# Patient Record
Sex: Female | Born: 1945 | Race: White | Hispanic: No | Smoking: Former smoker
Health system: Southern US, Community
[De-identification: ages and names within clinical notes are randomized; demographics above are authoritative.]

## PROBLEM LIST (undated history)

## (undated) DIAGNOSIS — I4891 Unspecified atrial fibrillation: Secondary | ICD-10-CM

## (undated) DIAGNOSIS — Z8679 Personal history of other diseases of the circulatory system: Secondary | ICD-10-CM

## (undated) DIAGNOSIS — E785 Hyperlipidemia, unspecified: Secondary | ICD-10-CM

## (undated) DIAGNOSIS — I2510 Atherosclerotic heart disease of native coronary artery without angina pectoris: Secondary | ICD-10-CM

## (undated) DIAGNOSIS — I10 Essential (primary) hypertension: Secondary | ICD-10-CM

## (undated) DIAGNOSIS — Z950 Presence of cardiac pacemaker: Secondary | ICD-10-CM

## (undated) DIAGNOSIS — I350 Nonrheumatic aortic (valve) stenosis: Secondary | ICD-10-CM

## (undated) DIAGNOSIS — I442 Atrioventricular block, complete: Secondary | ICD-10-CM

## (undated) DIAGNOSIS — Z9889 Other specified postprocedural states: Secondary | ICD-10-CM

## (undated) HISTORY — DX: Hyperlipidemia, unspecified: E78.5

## (undated) HISTORY — DX: Essential (primary) hypertension: I10

## (undated) HISTORY — DX: Other specified postprocedural states: Z98.890

## (undated) HISTORY — DX: Personal history of other diseases of the circulatory system: Z86.79

## (undated) HISTORY — DX: Nonrheumatic aortic (valve) stenosis: I35.0

## (undated) HISTORY — DX: Atrioventricular block, complete: I44.2

---

## 2003-08-15 ENCOUNTER — Ambulatory Visit (HOSPITAL_COMMUNITY): Admission: RE | Admit: 2003-08-15 | Discharge: 2003-08-15 | Payer: Self-pay | Admitting: *Deleted

## 2003-08-15 ENCOUNTER — Encounter (INDEPENDENT_AMBULATORY_CARE_PROVIDER_SITE_OTHER): Payer: Self-pay | Admitting: Cardiology

## 2005-02-21 ENCOUNTER — Emergency Department (HOSPITAL_COMMUNITY): Admission: EM | Admit: 2005-02-21 | Discharge: 2005-02-21 | Payer: Self-pay | Admitting: Emergency Medicine

## 2006-09-12 ENCOUNTER — Emergency Department (HOSPITAL_COMMUNITY): Admission: EM | Admit: 2006-09-12 | Discharge: 2006-09-13 | Payer: Self-pay | Admitting: Emergency Medicine

## 2006-09-15 ENCOUNTER — Emergency Department (HOSPITAL_COMMUNITY): Admission: EM | Admit: 2006-09-15 | Discharge: 2006-09-15 | Payer: Self-pay | Admitting: Emergency Medicine

## 2009-05-18 ENCOUNTER — Encounter: Admission: RE | Admit: 2009-05-18 | Discharge: 2009-05-18 | Payer: Self-pay | Admitting: Family Medicine

## 2009-08-21 ENCOUNTER — Encounter: Admission: RE | Admit: 2009-08-21 | Discharge: 2009-08-21 | Payer: Self-pay | Admitting: Cardiology

## 2009-08-25 ENCOUNTER — Ambulatory Visit (HOSPITAL_COMMUNITY): Admission: RE | Admit: 2009-08-25 | Discharge: 2009-08-25 | Payer: Self-pay | Admitting: Cardiology

## 2009-08-25 HISTORY — PX: CARDIAC CATHETERIZATION: SHX172

## 2009-09-11 ENCOUNTER — Ambulatory Visit: Payer: Self-pay | Admitting: Thoracic Surgery (Cardiothoracic Vascular Surgery)

## 2009-09-13 ENCOUNTER — Ambulatory Visit
Admission: RE | Admit: 2009-09-13 | Discharge: 2009-09-13 | Payer: Self-pay | Admitting: Thoracic Surgery (Cardiothoracic Vascular Surgery)

## 2009-09-19 ENCOUNTER — Ambulatory Visit: Payer: Self-pay | Admitting: Dentistry

## 2009-09-19 ENCOUNTER — Encounter: Admission: AD | Admit: 2009-09-19 | Discharge: 2009-09-19 | Payer: Self-pay | Admitting: Dentistry

## 2009-10-02 ENCOUNTER — Ambulatory Visit: Payer: Self-pay | Admitting: Thoracic Surgery (Cardiothoracic Vascular Surgery)

## 2010-02-12 ENCOUNTER — Ambulatory Visit: Payer: Self-pay | Admitting: Thoracic Surgery (Cardiothoracic Vascular Surgery)

## 2010-03-06 ENCOUNTER — Ambulatory Visit: Payer: Self-pay | Admitting: Vascular Surgery

## 2010-03-06 ENCOUNTER — Encounter: Payer: Self-pay | Admitting: Thoracic Surgery (Cardiothoracic Vascular Surgery)

## 2010-03-07 ENCOUNTER — Inpatient Hospital Stay (HOSPITAL_COMMUNITY)
Admission: RE | Admit: 2010-03-07 | Discharge: 2010-03-15 | Payer: Self-pay | Admitting: Thoracic Surgery (Cardiothoracic Vascular Surgery)

## 2010-03-07 ENCOUNTER — Encounter: Payer: Self-pay | Admitting: Thoracic Surgery (Cardiothoracic Vascular Surgery)

## 2010-03-07 ENCOUNTER — Ambulatory Visit: Payer: Self-pay | Admitting: Thoracic Surgery (Cardiothoracic Vascular Surgery)

## 2010-03-07 DIAGNOSIS — Z8679 Personal history of other diseases of the circulatory system: Secondary | ICD-10-CM

## 2010-03-07 DIAGNOSIS — I350 Nonrheumatic aortic (valve) stenosis: Secondary | ICD-10-CM

## 2010-03-07 HISTORY — PX: AORTIC VALVE REPLACEMENT: SHX41

## 2010-03-07 HISTORY — DX: Nonrheumatic aortic (valve) stenosis: I35.0

## 2010-03-07 HISTORY — PX: ABDOMINAL AORTIC ANEURYSM REPAIR: SUR1152

## 2010-03-07 HISTORY — DX: Personal history of other diseases of the circulatory system: Z86.79

## 2010-03-13 HISTORY — PX: PACEMAKER INSERTION: SHX728

## 2010-03-26 ENCOUNTER — Encounter
Admission: RE | Admit: 2010-03-26 | Discharge: 2010-03-26 | Payer: Self-pay | Admitting: Thoracic Surgery (Cardiothoracic Vascular Surgery)

## 2010-03-26 ENCOUNTER — Ambulatory Visit: Payer: Self-pay | Admitting: Thoracic Surgery (Cardiothoracic Vascular Surgery)

## 2010-04-17 ENCOUNTER — Emergency Department (HOSPITAL_COMMUNITY): Admission: EM | Admit: 2010-04-17 | Discharge: 2010-04-17 | Payer: Self-pay | Admitting: Emergency Medicine

## 2010-04-23 ENCOUNTER — Encounter: Admission: RE | Admit: 2010-04-23 | Discharge: 2010-04-23 | Payer: Self-pay | Admitting: Cardiothoracic Surgery

## 2010-04-23 ENCOUNTER — Ambulatory Visit: Payer: Self-pay | Admitting: Thoracic Surgery (Cardiothoracic Vascular Surgery)

## 2010-05-07 ENCOUNTER — Encounter
Admission: RE | Admit: 2010-05-07 | Discharge: 2010-05-07 | Payer: Self-pay | Admitting: Thoracic Surgery (Cardiothoracic Vascular Surgery)

## 2010-05-07 ENCOUNTER — Ambulatory Visit: Payer: Self-pay | Admitting: Thoracic Surgery (Cardiothoracic Vascular Surgery)

## 2010-05-10 ENCOUNTER — Encounter (HOSPITAL_COMMUNITY): Admission: RE | Admit: 2010-05-10 | Discharge: 2010-06-01 | Payer: Self-pay | Admitting: Cardiology

## 2010-07-02 ENCOUNTER — Ambulatory Visit: Payer: Self-pay | Admitting: Thoracic Surgery (Cardiothoracic Vascular Surgery)

## 2011-02-18 LAB — CBC
HCT: 38.3 % (ref 36.0–46.0)
Hemoglobin: 12.9 g/dL (ref 12.0–15.0)
MCHC: 33.7 g/dL (ref 30.0–36.0)
MCV: 89.9 fL (ref 78.0–100.0)
Platelets: 282 10*3/uL (ref 150–400)
RBC: 4.27 MIL/uL (ref 3.87–5.11)
RDW: 14.5 % (ref 11.5–15.5)
WBC: 7 10*3/uL (ref 4.0–10.5)

## 2011-02-18 LAB — DIFFERENTIAL
Basophils Absolute: 0 10*3/uL (ref 0.0–0.1)
Basophils Relative: 1 % (ref 0–1)
Eosinophils Absolute: 0.2 10*3/uL (ref 0.0–0.7)
Eosinophils Relative: 3 % (ref 0–5)
Lymphocytes Relative: 19 % (ref 12–46)
Lymphs Abs: 1.3 10*3/uL (ref 0.7–4.0)
Monocytes Absolute: 0.5 10*3/uL (ref 0.1–1.0)
Monocytes Relative: 7 % (ref 3–12)
Neutro Abs: 4.9 10*3/uL (ref 1.7–7.7)
Neutrophils Relative %: 71 % (ref 43–77)

## 2011-02-18 LAB — COMPREHENSIVE METABOLIC PANEL
ALT: 14 U/L (ref 0–35)
AST: 23 U/L (ref 0–37)
Albumin: 3.5 g/dL (ref 3.5–5.2)
Alkaline Phosphatase: 52 U/L (ref 39–117)
BUN: 8 mg/dL (ref 6–23)
CO2: 25 mEq/L (ref 19–32)
Calcium: 9.1 mg/dL (ref 8.4–10.5)
Chloride: 107 mEq/L (ref 96–112)
Creatinine, Ser: 0.72 mg/dL (ref 0.4–1.2)
GFR calc Af Amer: >60 mL/min (ref >60)
GFR calc non Af Amer: >60 mL/min (ref >60)
Glucose, Bld: 119 mg/dL — ABNORMAL HIGH (ref 70–99)
Potassium: 3.6 mEq/L (ref 3.5–5.1)
Sodium: 141 mEq/L (ref 135–145)
Total Bilirubin: 0.7 mg/dL (ref 0.3–1.2)
Total Protein: 7.6 g/dL (ref 6.0–8.3)

## 2011-02-18 LAB — PROTIME-INR
INR: 3.04 — ABNORMAL HIGH (ref 0.00–1.49)
Prothrombin Time: 31.2 seconds — ABNORMAL HIGH (ref 11.6–15.2)

## 2011-02-20 LAB — TYPE AND SCREEN
ABO/RH(D): B POS
Antibody Screen: NEGATIVE

## 2011-02-20 LAB — PREPARE PLATELETS

## 2011-02-20 LAB — POCT I-STAT 3, ART BLOOD GAS (G3+)
Acid-Base Excess: 1 mmol/L (ref 0.0–2.0)
Acid-Base Excess: 1 mmol/L (ref 0.0–2.0)
Acid-Base Excess: 1 mmol/L (ref 0.0–2.0)
Acid-base deficit: 1 mmol/L (ref 0.0–2.0)
Bicarbonate: 25.2 mEq/L — ABNORMAL HIGH (ref 20.0–24.0)
Bicarbonate: 25.8 mEq/L — ABNORMAL HIGH (ref 20.0–24.0)
Bicarbonate: 26 mEq/L — ABNORMAL HIGH (ref 20.0–24.0)
Bicarbonate: 27.2 mEq/L — ABNORMAL HIGH (ref 20.0–24.0)
Bicarbonate: 27.2 mEq/L — ABNORMAL HIGH (ref 20.0–24.0)
O2 Saturation: 100 %
O2 Saturation: 100 %
O2 Saturation: 100 %
O2 Saturation: 88 %
O2 Saturation: 99 %
Patient temperature: 33.9
Patient temperature: 37.1
TCO2: 27 mmol/L (ref 0–100)
TCO2: 27 mmol/L (ref 0–100)
TCO2: 27 mmol/L (ref 0–100)
TCO2: 29 mmol/L (ref 0–100)
TCO2: 29 mmol/L (ref 0–100)
pCO2 arterial: 39.7 mmHg (ref 35.0–45.0)
pCO2 arterial: 40.7 mmHg (ref 35.0–45.0)
pCO2 arterial: 47.6 mmHg — ABNORMAL HIGH (ref 35.0–45.0)
pCO2 arterial: 50.2 mmHg — ABNORMAL HIGH (ref 35.0–45.0)
pCO2 arterial: 54 mmHg — ABNORMAL HIGH (ref 35.0–45.0)
pH, Arterial: 7.311 — ABNORMAL LOW (ref 7.350–7.400)
pH, Arterial: 7.332 — ABNORMAL LOW (ref 7.350–7.400)
pH, Arterial: 7.342 — ABNORMAL LOW (ref 7.350–7.400)
pH, Arterial: 7.409 — ABNORMAL HIGH (ref 7.350–7.400)
pH, Arterial: 7.41 — ABNORMAL HIGH (ref 7.350–7.400)
pO2, Arterial: 142 mmHg — ABNORMAL HIGH (ref 80.0–100.0)
pO2, Arterial: 193 mmHg — ABNORMAL HIGH (ref 80.0–100.0)
pO2, Arterial: 349 mmHg — ABNORMAL HIGH (ref 80.0–100.0)
pO2, Arterial: 383 mmHg — ABNORMAL HIGH (ref 80.0–100.0)
pO2, Arterial: 61 mmHg — ABNORMAL LOW (ref 80.0–100.0)

## 2011-02-20 LAB — BLOOD GAS, ARTERIAL
Acid-Base Excess: 3 mmol/L — ABNORMAL HIGH (ref 0.0–2.0)
Bicarbonate: 27.1 mEq/L — ABNORMAL HIGH (ref 20.0–24.0)
Drawn by: 206361
FIO2: 0.21 %
O2 Saturation: 99.4 %
Patient temperature: 98.6
TCO2: 28.4 mmol/L (ref 0–100)
pCO2 arterial: 42 mmHg (ref 35.0–45.0)
pH, Arterial: 7.425 — ABNORMAL HIGH (ref 7.350–7.400)
pO2, Arterial: 78.5 mmHg — ABNORMAL LOW (ref 80.0–100.0)

## 2011-02-20 LAB — BASIC METABOLIC PANEL
BUN: 11 mg/dL (ref 6–23)
BUN: 13 mg/dL (ref 6–23)
BUN: 19 mg/dL (ref 6–23)
BUN: 6 mg/dL (ref 6–23)
CO2: 24 mEq/L (ref 19–32)
CO2: 26 mEq/L (ref 19–32)
CO2: 27 mEq/L (ref 19–32)
CO2: 32 mEq/L (ref 19–32)
Calcium: 8.1 mg/dL — ABNORMAL LOW (ref 8.4–10.5)
Calcium: 8.3 mg/dL — ABNORMAL LOW (ref 8.4–10.5)
Calcium: 8.6 mg/dL (ref 8.4–10.5)
Calcium: 8.8 mg/dL (ref 8.4–10.5)
Chloride: 101 mEq/L (ref 96–112)
Chloride: 102 mEq/L (ref 96–112)
Chloride: 104 mEq/L (ref 96–112)
Chloride: 113 mEq/L — ABNORMAL HIGH (ref 96–112)
Creatinine, Ser: 0.6 mg/dL (ref 0.4–1.2)
Creatinine, Ser: 0.62 mg/dL (ref 0.4–1.2)
Creatinine, Ser: 0.71 mg/dL (ref 0.4–1.2)
Creatinine, Ser: 0.85 mg/dL (ref 0.4–1.2)
GFR calc Af Amer: >60 mL/min (ref >60)
GFR calc Af Amer: >60 mL/min (ref >60)
GFR calc Af Amer: >60 mL/min (ref >60)
GFR calc Af Amer: >60 mL/min (ref >60)
GFR calc non Af Amer: >60 mL/min (ref >60)
GFR calc non Af Amer: >60 mL/min (ref >60)
GFR calc non Af Amer: >60 mL/min (ref >60)
GFR calc non Af Amer: >60 mL/min (ref >60)
Glucose, Bld: 104 mg/dL — ABNORMAL HIGH (ref 70–99)
Glucose, Bld: 112 mg/dL — ABNORMAL HIGH (ref 70–99)
Glucose, Bld: 135 mg/dL — ABNORMAL HIGH (ref 70–99)
Glucose, Bld: 99 mg/dL (ref 70–99)
Potassium: 4 mEq/L (ref 3.5–5.1)
Potassium: 4 mEq/L (ref 3.5–5.1)
Potassium: 4.2 mEq/L (ref 3.5–5.1)
Potassium: 4.3 mEq/L (ref 3.5–5.1)
Sodium: 135 mEq/L (ref 135–145)
Sodium: 135 mEq/L (ref 135–145)
Sodium: 137 mEq/L (ref 135–145)
Sodium: 142 mEq/L (ref 135–145)

## 2011-02-20 LAB — POCT I-STAT, CHEM 8
BUN: 14 mg/dL (ref 6–23)
Calcium, Ion: 1.15 mmol/L (ref 1.12–1.32)
Chloride: 109 mEq/L (ref 96–112)
Creatinine, Ser: 0.6 mg/dL (ref 0.4–1.2)
Glucose, Bld: 107 mg/dL — ABNORMAL HIGH (ref 70–99)
HCT: 29 % — ABNORMAL LOW (ref 36.0–46.0)
Hemoglobin: 9.9 g/dL — ABNORMAL LOW (ref 12.0–15.0)
Potassium: 4.3 mEq/L (ref 3.5–5.1)
Sodium: 143 mEq/L (ref 135–145)
TCO2: 25 mmol/L (ref 0–100)

## 2011-02-20 LAB — POCT I-STAT 4, (NA,K, GLUC, HGB,HCT)
Glucose, Bld: 102 mg/dL — ABNORMAL HIGH (ref 70–99)
Glucose, Bld: 104 mg/dL — ABNORMAL HIGH (ref 70–99)
Glucose, Bld: 105 mg/dL — ABNORMAL HIGH (ref 70–99)
Glucose, Bld: 106 mg/dL — ABNORMAL HIGH (ref 70–99)
Glucose, Bld: 111 mg/dL — ABNORMAL HIGH (ref 70–99)
Glucose, Bld: 112 mg/dL — ABNORMAL HIGH (ref 70–99)
Glucose, Bld: 129 mg/dL — ABNORMAL HIGH (ref 70–99)
HCT: 25 % — ABNORMAL LOW (ref 36.0–46.0)
HCT: 26 % — ABNORMAL LOW (ref 36.0–46.0)
HCT: 27 % — ABNORMAL LOW (ref 36.0–46.0)
HCT: 28 % — ABNORMAL LOW (ref 36.0–46.0)
HCT: 28 % — ABNORMAL LOW (ref 36.0–46.0)
HCT: 28 % — ABNORMAL LOW (ref 36.0–46.0)
HCT: 28 % — ABNORMAL LOW (ref 36.0–46.0)
Hemoglobin: 8.5 g/dL — ABNORMAL LOW (ref 12.0–15.0)
Hemoglobin: 8.8 g/dL — ABNORMAL LOW (ref 12.0–15.0)
Hemoglobin: 9.2 g/dL — ABNORMAL LOW (ref 12.0–15.0)
Hemoglobin: 9.5 g/dL — ABNORMAL LOW (ref 12.0–15.0)
Hemoglobin: 9.5 g/dL — ABNORMAL LOW (ref 12.0–15.0)
Hemoglobin: 9.5 g/dL — ABNORMAL LOW (ref 12.0–15.0)
Hemoglobin: 9.5 g/dL — ABNORMAL LOW (ref 12.0–15.0)
Potassium: 3.4 mEq/L — ABNORMAL LOW (ref 3.5–5.1)
Potassium: 3.4 mEq/L — ABNORMAL LOW (ref 3.5–5.1)
Potassium: 3.6 mEq/L (ref 3.5–5.1)
Potassium: 3.8 mEq/L (ref 3.5–5.1)
Potassium: 4 mEq/L (ref 3.5–5.1)
Potassium: 4.5 mEq/L (ref 3.5–5.1)
Potassium: 5 mEq/L (ref 3.5–5.1)
Sodium: 137 mEq/L (ref 135–145)
Sodium: 138 mEq/L (ref 135–145)
Sodium: 139 mEq/L (ref 135–145)
Sodium: 141 mEq/L (ref 135–145)
Sodium: 142 mEq/L (ref 135–145)
Sodium: 142 mEq/L (ref 135–145)
Sodium: 143 mEq/L (ref 135–145)

## 2011-02-20 LAB — APTT
aPTT: 31 seconds (ref 24–37)
aPTT: 36 seconds (ref 24–37)
aPTT: 41 seconds — ABNORMAL HIGH (ref 24–37)

## 2011-02-20 LAB — PROTIME-INR
INR: 1.18 (ref 0.00–1.49)
INR: 1.21 (ref 0.00–1.49)
INR: 1.25 (ref 0.00–1.49)
INR: 1.28 (ref 0.00–1.49)
INR: 1.37 (ref 0.00–1.49)
INR: 1.43 (ref 0.00–1.49)
INR: 1.57 — ABNORMAL HIGH (ref 0.00–1.49)
INR: 1.6 — ABNORMAL HIGH (ref 0.00–1.49)
INR: 2 — ABNORMAL HIGH (ref 0.00–1.49)
Prothrombin Time: 14.9 seconds (ref 11.6–15.2)
Prothrombin Time: 15.2 seconds (ref 11.6–15.2)
Prothrombin Time: 15.6 seconds — ABNORMAL HIGH (ref 11.6–15.2)
Prothrombin Time: 15.9 seconds — ABNORMAL HIGH (ref 11.6–15.2)
Prothrombin Time: 16.8 seconds — ABNORMAL HIGH (ref 11.6–15.2)
Prothrombin Time: 17.3 seconds — ABNORMAL HIGH (ref 11.6–15.2)
Prothrombin Time: 18.6 seconds — ABNORMAL HIGH (ref 11.6–15.2)
Prothrombin Time: 18.9 seconds — ABNORMAL HIGH (ref 11.6–15.2)
Prothrombin Time: 22.5 seconds — ABNORMAL HIGH (ref 11.6–15.2)

## 2011-02-20 LAB — HEMOGLOBIN AND HEMATOCRIT, BLOOD
HCT: 29.3 % — ABNORMAL LOW (ref 36.0–46.0)
Hemoglobin: 10.1 g/dL — ABNORMAL LOW (ref 12.0–15.0)

## 2011-02-20 LAB — GLUCOSE, CAPILLARY
Glucose-Capillary: 104 mg/dL — ABNORMAL HIGH (ref 70–99)
Glucose-Capillary: 106 mg/dL — ABNORMAL HIGH (ref 70–99)
Glucose-Capillary: 108 mg/dL — ABNORMAL HIGH (ref 70–99)
Glucose-Capillary: 109 mg/dL — ABNORMAL HIGH (ref 70–99)
Glucose-Capillary: 113 mg/dL — ABNORMAL HIGH (ref 70–99)
Glucose-Capillary: 129 mg/dL — ABNORMAL HIGH (ref 70–99)
Glucose-Capillary: 167 mg/dL — ABNORMAL HIGH (ref 70–99)
Glucose-Capillary: 168 mg/dL — ABNORMAL HIGH (ref 70–99)
Glucose-Capillary: 80 mg/dL (ref 70–99)
Glucose-Capillary: 91 mg/dL (ref 70–99)
Glucose-Capillary: 92 mg/dL (ref 70–99)

## 2011-02-20 LAB — CBC
HCT: 27.2 % — ABNORMAL LOW (ref 36.0–46.0)
HCT: 29 % — ABNORMAL LOW (ref 36.0–46.0)
HCT: 29.6 % — ABNORMAL LOW (ref 36.0–46.0)
HCT: 30.8 % — ABNORMAL LOW (ref 36.0–46.0)
HCT: 31 % — ABNORMAL LOW (ref 36.0–46.0)
HCT: 32.3 % — ABNORMAL LOW (ref 36.0–46.0)
HCT: 34.8 % — ABNORMAL LOW (ref 36.0–46.0)
Hemoglobin: 10.5 g/dL — ABNORMAL LOW (ref 12.0–15.0)
Hemoglobin: 10.6 g/dL — ABNORMAL LOW (ref 12.0–15.0)
Hemoglobin: 11 g/dL — ABNORMAL LOW (ref 12.0–15.0)
Hemoglobin: 11.6 g/dL — ABNORMAL LOW (ref 12.0–15.0)
Hemoglobin: 9.2 g/dL — ABNORMAL LOW (ref 12.0–15.0)
Hemoglobin: 9.7 g/dL — ABNORMAL LOW (ref 12.0–15.0)
Hemoglobin: 9.9 g/dL — ABNORMAL LOW (ref 12.0–15.0)
MCHC: 33.4 g/dL (ref 30.0–36.0)
MCHC: 33.6 g/dL (ref 30.0–36.0)
MCHC: 33.6 g/dL (ref 30.0–36.0)
MCHC: 33.8 g/dL (ref 30.0–36.0)
MCHC: 34 g/dL (ref 30.0–36.0)
MCHC: 34.1 g/dL (ref 30.0–36.0)
MCHC: 34.2 g/dL (ref 30.0–36.0)
MCV: 92 fL (ref 78.0–100.0)
MCV: 93 fL (ref 78.0–100.0)
MCV: 93 fL (ref 78.0–100.0)
MCV: 93.5 fL (ref 78.0–100.0)
MCV: 93.9 fL (ref 78.0–100.0)
MCV: 94.1 fL (ref 78.0–100.0)
MCV: 94.5 fL (ref 78.0–100.0)
Platelets: 110 10*3/uL — ABNORMAL LOW (ref 150–400)
Platelets: 114 10*3/uL — ABNORMAL LOW (ref 150–400)
Platelets: 117 10*3/uL — ABNORMAL LOW (ref 150–400)
Platelets: 119 10*3/uL — ABNORMAL LOW (ref 150–400)
Platelets: 122 10*3/uL — ABNORMAL LOW (ref 150–400)
Platelets: 141 10*3/uL — ABNORMAL LOW (ref 150–400)
Platelets: 216 10*3/uL (ref 150–400)
RBC: 2.91 MIL/uL — ABNORMAL LOW (ref 3.87–5.11)
RBC: 3.09 MIL/uL — ABNORMAL LOW (ref 3.87–5.11)
RBC: 3.19 MIL/uL — ABNORMAL LOW (ref 3.87–5.11)
RBC: 3.29 MIL/uL — ABNORMAL LOW (ref 3.87–5.11)
RBC: 3.31 MIL/uL — ABNORMAL LOW (ref 3.87–5.11)
RBC: 3.42 MIL/uL — ABNORMAL LOW (ref 3.87–5.11)
RBC: 3.78 MIL/uL — ABNORMAL LOW (ref 3.87–5.11)
RDW: 13.9 % (ref 11.5–15.5)
RDW: 14.4 % (ref 11.5–15.5)
RDW: 14.4 % (ref 11.5–15.5)
RDW: 14.5 % (ref 11.5–15.5)
RDW: 14.7 % (ref 11.5–15.5)
RDW: 14.8 % (ref 11.5–15.5)
RDW: 14.9 % (ref 11.5–15.5)
WBC: 10.5 10*3/uL (ref 4.0–10.5)
WBC: 4.3 10*3/uL (ref 4.0–10.5)
WBC: 6.1 10*3/uL (ref 4.0–10.5)
WBC: 6.6 10*3/uL (ref 4.0–10.5)
WBC: 6.7 10*3/uL (ref 4.0–10.5)
WBC: 7.4 10*3/uL (ref 4.0–10.5)
WBC: 7.8 10*3/uL (ref 4.0–10.5)

## 2011-02-20 LAB — POCT I-STAT GLUCOSE
Glucose, Bld: 116 mg/dL — ABNORMAL HIGH (ref 70–99)
Operator id: 3402

## 2011-02-20 LAB — PREPARE FRESH FROZEN PLASMA

## 2011-02-20 LAB — COMPREHENSIVE METABOLIC PANEL
ALT: 19 U/L (ref 0–35)
AST: 23 U/L (ref 0–37)
Albumin: 3.9 g/dL (ref 3.5–5.2)
Alkaline Phosphatase: 40 U/L (ref 39–117)
BUN: 16 mg/dL (ref 6–23)
CO2: 25 mEq/L (ref 19–32)
Calcium: 8.8 mg/dL (ref 8.4–10.5)
Chloride: 109 mEq/L (ref 96–112)
Creatinine, Ser: 0.55 mg/dL (ref 0.4–1.2)
GFR calc Af Amer: >60 mL/min (ref >60)
GFR calc non Af Amer: >60 mL/min (ref >60)
Glucose, Bld: 102 mg/dL — ABNORMAL HIGH (ref 70–99)
Potassium: 3.9 mEq/L (ref 3.5–5.1)
Sodium: 139 mEq/L (ref 135–145)
Total Bilirubin: 0.7 mg/dL (ref 0.3–1.2)
Total Protein: 6.8 g/dL (ref 6.0–8.3)

## 2011-02-20 LAB — URINALYSIS, ROUTINE W REFLEX MICROSCOPIC
Bilirubin Urine: NEGATIVE
Glucose, UA: NEGATIVE mg/dL
Hgb urine dipstick: NEGATIVE
Ketones, ur: NEGATIVE mg/dL
Nitrite: NEGATIVE
Protein, ur: NEGATIVE mg/dL
Specific Gravity, Urine: 1.018 (ref 1.005–1.030)
Urobilinogen, UA: 0.2 mg/dL (ref 0.0–1.0)
pH: 7 (ref 5.0–8.0)

## 2011-02-20 LAB — MRSA PCR SCREENING: MRSA by PCR: NEGATIVE

## 2011-02-20 LAB — CREATININE, SERUM
Creatinine, Ser: 0.68 mg/dL (ref 0.4–1.2)
GFR calc Af Amer: >60 mL/min (ref >60)
GFR calc non Af Amer: >60 mL/min (ref >60)

## 2011-02-20 LAB — ABO/RH: ABO/RH(D): B POS

## 2011-02-20 LAB — HEMOGLOBIN A1C
Hgb A1c MFr Bld: 5.8 % (ref 4.6–6.1)
Mean Plasma Glucose: 120 mg/dL

## 2011-02-20 LAB — PLATELET COUNT: Platelets: 87 10*3/uL — ABNORMAL LOW (ref 150–400)

## 2011-02-20 LAB — MAGNESIUM
Magnesium: 2.4 mg/dL (ref 1.5–2.5)
Magnesium: 2.8 mg/dL — ABNORMAL HIGH (ref 1.5–2.5)

## 2011-02-20 LAB — SURGICAL PCR SCREEN
MRSA, PCR: NEGATIVE
Staphylococcus aureus: POSITIVE — AB

## 2011-03-08 LAB — POCT I-STAT 3, ART BLOOD GAS (G3+)
Acid-Base Excess: 1 mmol/L (ref 0.0–2.0)
Bicarbonate: 26 mEq/L — ABNORMAL HIGH (ref 20.0–24.0)
Bicarbonate: 26 mEq/L — ABNORMAL HIGH (ref 20.0–24.0)
O2 Saturation: 54 %
O2 Saturation: 89 %
TCO2: 27 mmol/L (ref 0–100)
TCO2: 27 mmol/L (ref 0–100)
pCO2 arterial: 41.3 mmHg (ref 35.0–45.0)
pCO2 arterial: 47 mmHg — ABNORMAL HIGH (ref 35.0–45.0)
pH, Arterial: 7.351 (ref 7.350–7.400)
pH, Arterial: 7.407 — ABNORMAL HIGH (ref 7.350–7.400)
pO2, Arterial: 30 mmHg — CL (ref 80.0–100.0)
pO2, Arterial: 56 mmHg — ABNORMAL LOW (ref 80.0–100.0)

## 2011-03-08 LAB — PROTIME-INR
INR: 1.1 (ref 0.00–1.49)
Prothrombin Time: 14.2 seconds (ref 11.6–15.2)

## 2011-03-26 IMAGING — CR DG CHEST 2V
2 series · 2 of 2 positions shown · non-contrast
Comparison: 03/08/2010

CLINICAL DATA: Coronary bypass, cough

CHEST - 2 VIEW

[w chest pa]
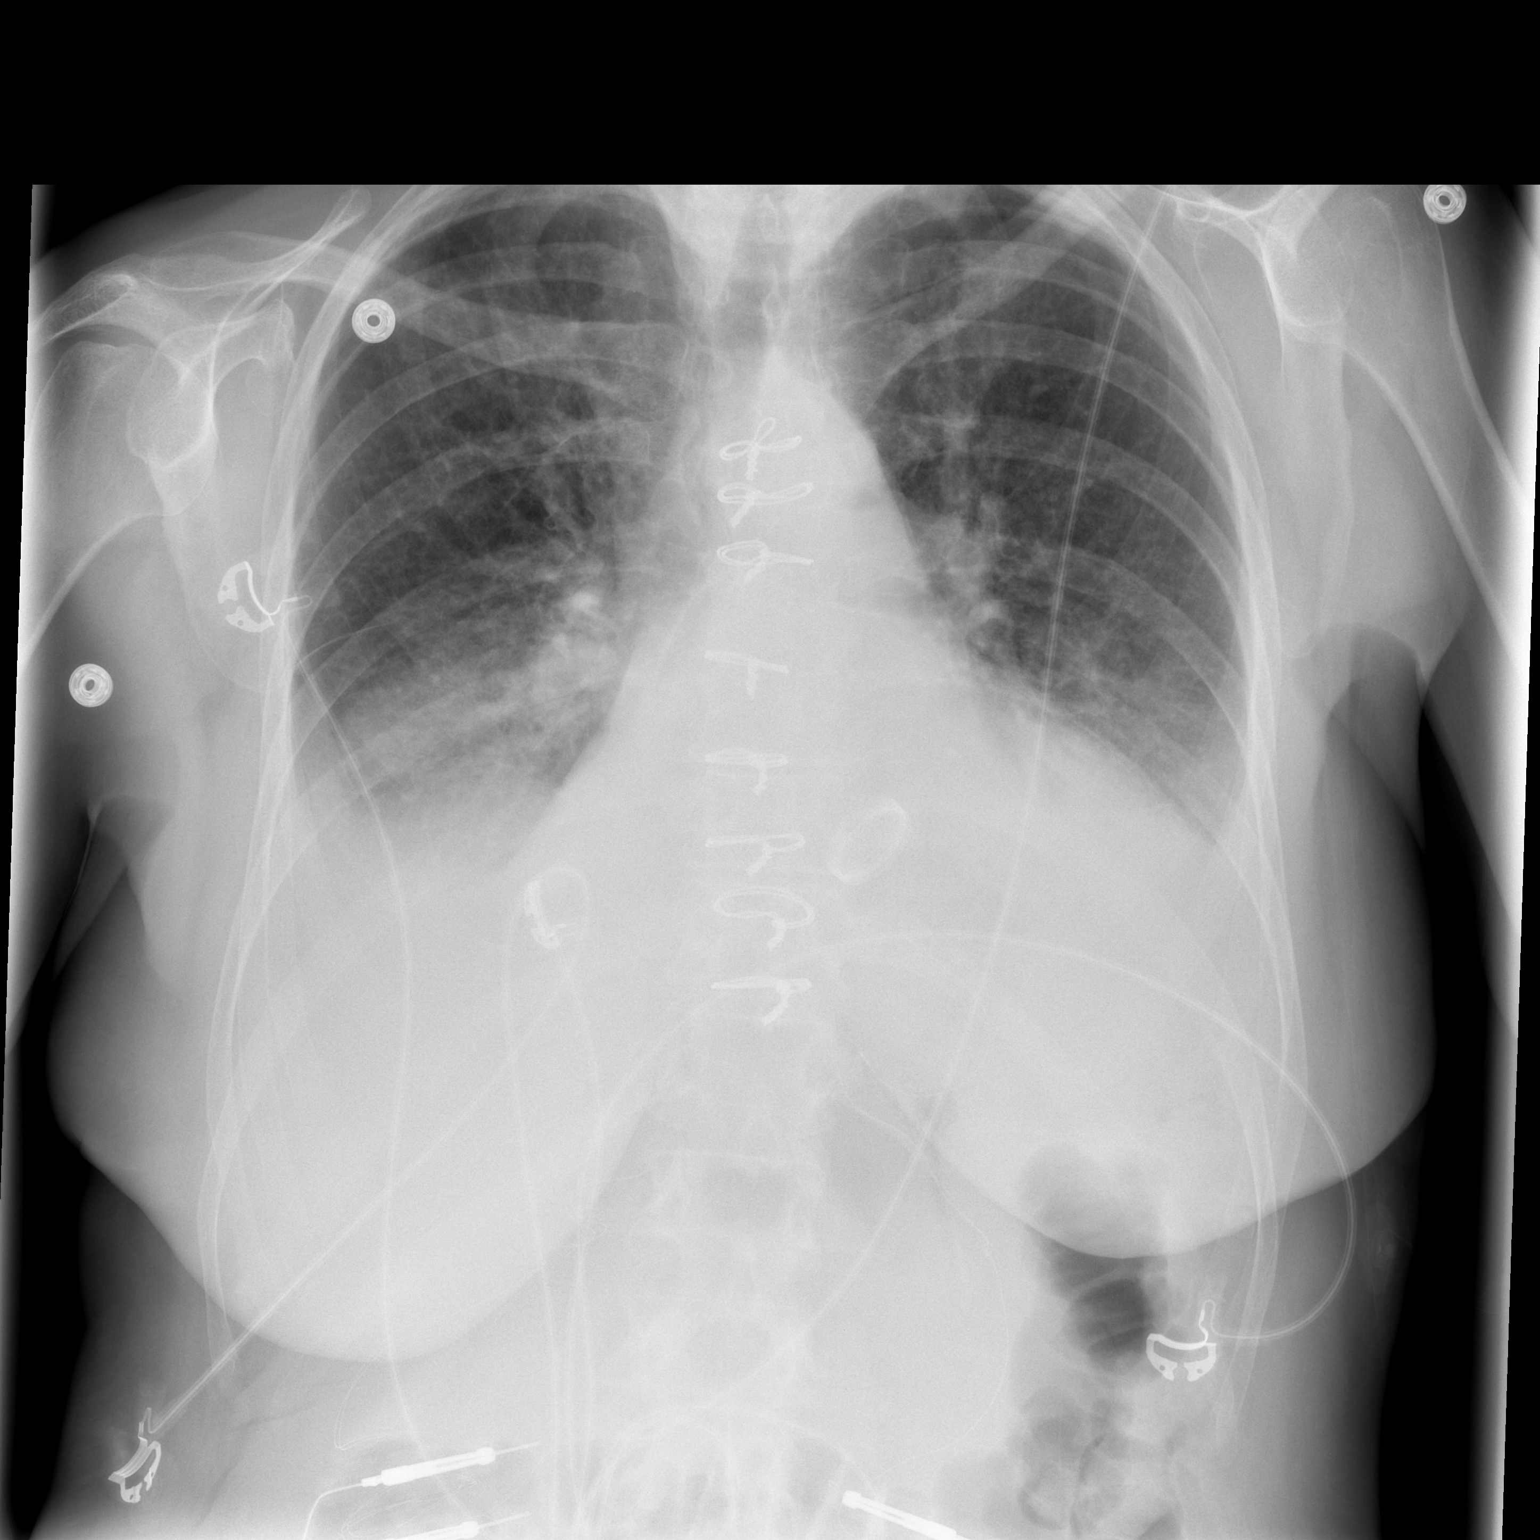

[w chest lat]
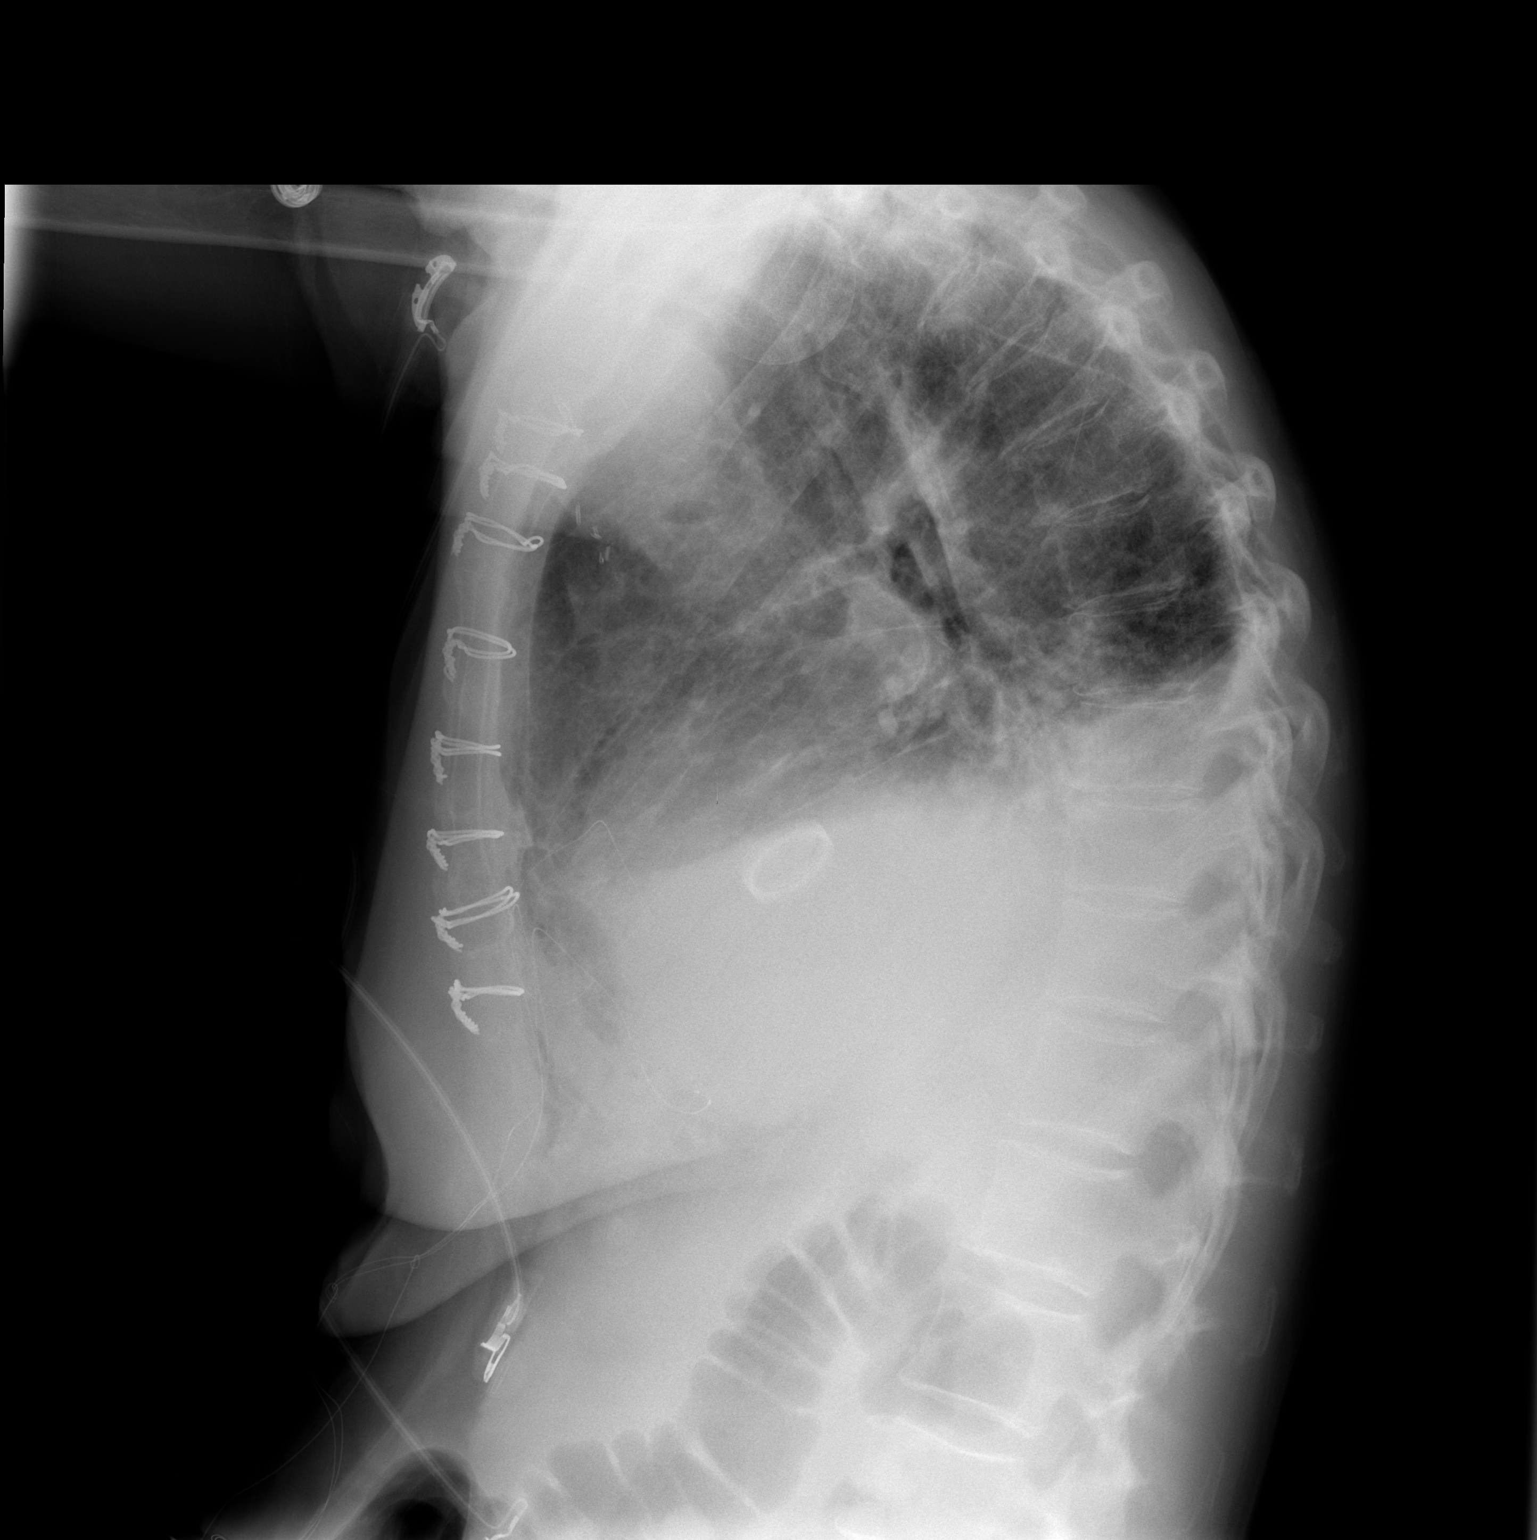

[2 of 2 positions shown; findings below may reference images not displayed]

FINDINGS: Interval removal of the mediastinal drain, chest tube and
Swan-Ganz catheter.  Aortic valve replacement noted.  Heart is
enlarged with basilar edema and atelectasis with associated
moderate effusions.  No pneumothorax.
IMPRESSION: Moderate effusions with basilar atelectasis / edema.

## 2011-03-31 IMAGING — CR DG CHEST 2V
2 series · 2 of 2 positions shown · non-contrast
Comparison: 03/09/2010 and earlier.

CLINICAL DATA: 64-year-old female status post pacemaker placement.

CHEST - 2 VIEW

[w chest pa]
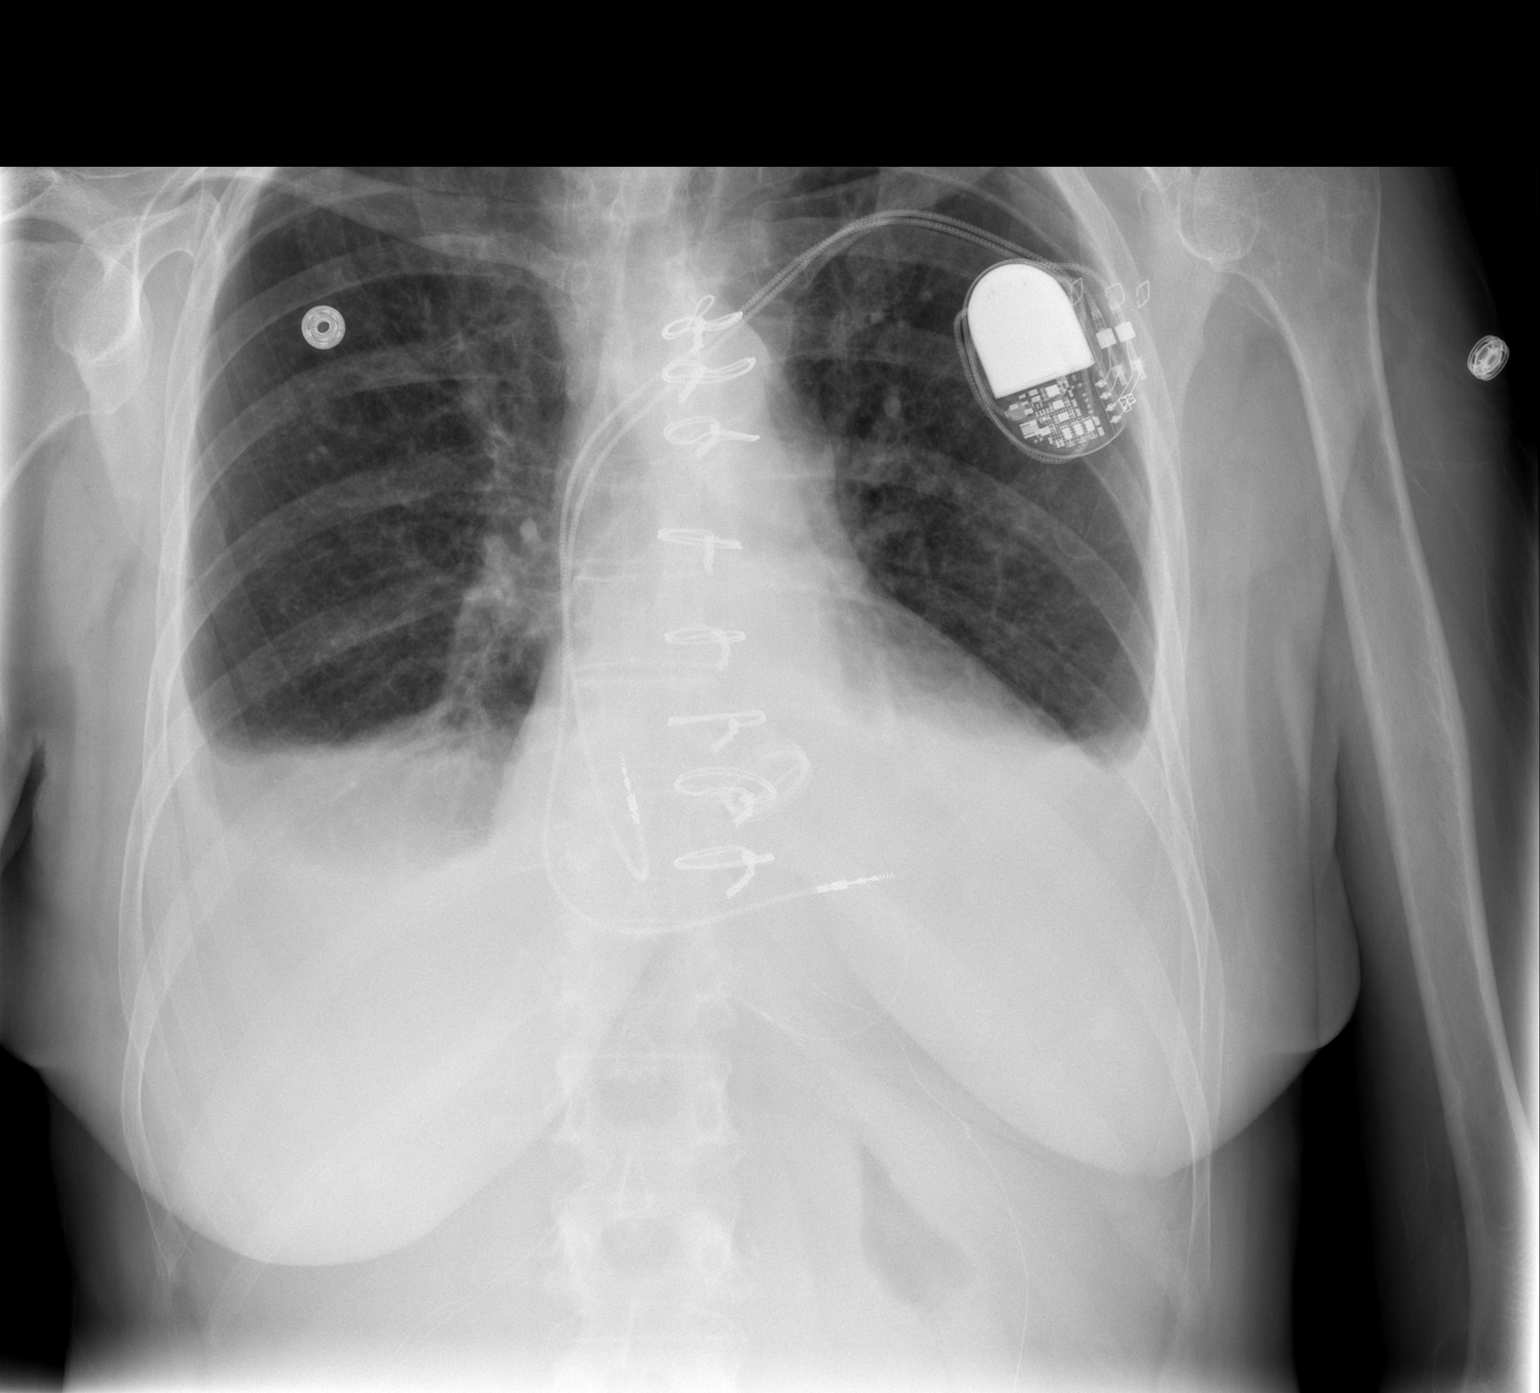

[w chest lat]
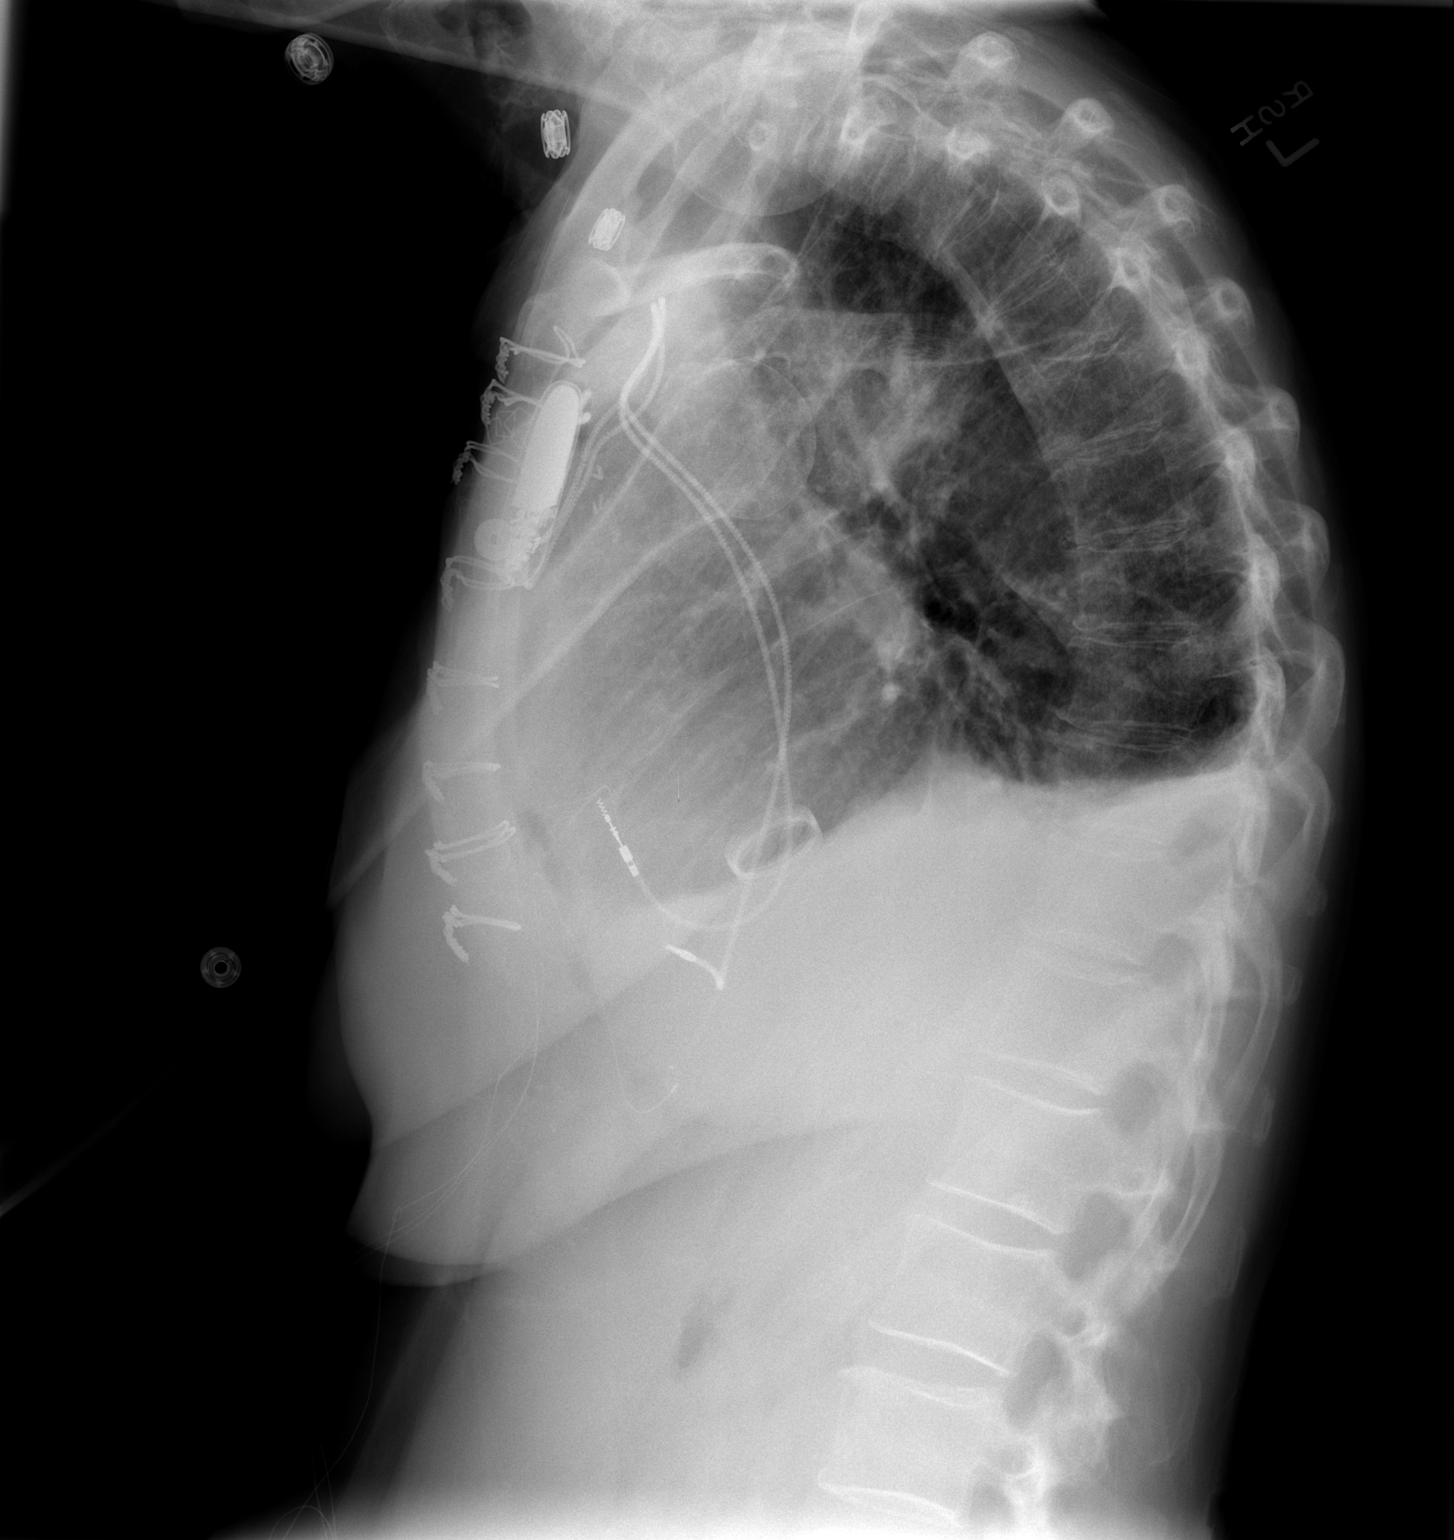

[2 of 2 positions shown; findings below may reference images not displayed]

FINDINGS: New left chest dual lead cardiac pacemaker.  No
pneumothorax.  Preexisting sequelae of median sternotomy and
cardiac valve replacement. Stable cardiomegaly and mediastinal
contours.  Moderate bilateral pleural effusions with bibasilar
collapse.  Interval decreased pulmonary vascular congestion.  No
overt pulmonary edema. Stable visualized osseous structures.
IMPRESSION: 1.  Left chest cardiac pacemaker.  No adverse features.
2.  Moderate pleural effusions.  Decreased / resolved pulmonary
edema.

## 2011-04-16 NOTE — Assessment & Plan Note (Signed)
OFFICE VISIT   [name] [name], [name] [name]  DOB:  June 16, 1946                                        October 02, 2009  CHART #:  [id number]   The patient returns for further follow up of severe aortic stenosis.  She was originally seen in consultation on September 11, 2009, and a full  history and physical exam was dictated at that time.  Since then, she  has been seen in consultation by Dr. Cindra Eves at the dental  office.  Dental extraction has been recommended.  She has been referred  to Dr. Hewitt Blade who is an oral surgeon for surgical intervention.  She has also been seen by Dr. Warren Danes but plans for the extraction  have not yet been finalized.  Finally, she also underwent pulmonary  function testing and CT angiogram of the thoracic aorta.  Pulmonary  function tests revealed findings consistent with mild-to-moderate  chronic obstructive pulmonary disease.  Specifically, the FEV-1 measured  1.57 L or 58% predicted.  This increased slightly to 1.63 L or 61%  predicted with bronchodilator therapy.  The baseline forced vital  capacity was 2.32 L or 66% predicted.  CT angiogram of the thoracic  aorta was performed on September 13, 2009.  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  To my exam the maximum transverse diameter of the aorta  was between 4.8 and 4.9 cm.  This corresponds to a descending thoracic  aorta that measures 2.3 cm.  In addition, there was a small high-density  opacity in the lingula with benign characteristics.  The radiologist  recommended followup CT scan in 6-12 months to confirm lack of change.  During the interval period of time, the patient has not been able to  quit smoking.   I again discussed matters at length with the patient and her daughter  here in the office today.  We discussed how important it will be for her  to find a way to quit smoking, and I have also  stressed the fact that  even if she can only quit smoking for a few weeks prior to surgery this  will decrease her risk of perioperative complications.  We await plans  for dental extraction, and once this has been completed we can schedule  her for surgery.  Based upon her recent CT angiogram, I do feel that  replacement of the ascending thoracic aorta will be indicated in this  setting of this patient with bicuspid aortic valve disease.  The patient  will call us back once her dental extraction has  been scheduled.  All of her questions have been addressed.  She is going  to make a concerted effort to quit smoking.   Salvatore Decent. Cornelius Moras, M.D.  Electronically Signed   CHO/MEDQ  D:  10/02/2009  T:  10/03/2009  Job:  [id number]   cc:   Sheliah Mends, MD

## 2011-04-16 NOTE — Assessment & Plan Note (Signed)
OFFICE VISIT   [name], [name]  DOB:  03-11-1946                                        March 26, 2010  CHART #:  [id number]   HISTORY OF PRESENT ILLNESS:  The patient returns to the office today for  routine followup status post Bentall aortic root replacement on March 07, 2010.  Her early postoperative recovery was notable for the development  of complete heart block for which she underwent placement of a permanent  pacemaker.  She otherwise did remarkably well.  Since hospital  discharge, she has continued to improve quite nicely.  She has had a  prothrombin time checked and Coumadin dose adjusted through the  [facility].  She states that her most recent  INR was 3.1 and she is taking 5 mg of Coumadin daily.  She has also been  seen in follow up by Dr. Royann Shivers who checked her pacemaker.  She has  not had any other problems at all, and she plans to start the cardiac  rehab program within the next week or two.  Overall, she reports mild  residual soreness particularly around her pacemaker in the left anterior  chest wall.  She is sleeping reasonably well at night and she is no  longer requiring any sort of oral narcotic pain relievers.  She has not  had any significant shortness of breath.  She has not had any tachy  palpitations or dizzy spells.  Her appetite is fair and has not  completely recovered.  She continues to abstain from any tobacco use.  The remainder of her review of systems is unremarkable.   PHYSICAL EXAMINATION:  Notable for well-appearing female with blood  pressure 134/78, pulse 75 and regular, oxygen saturation 95% on room  air.  Examination of the chest is notable for median sternotomy incision  that is healing nicely.  The sternum is stable on palpation.  The  pacemaker incision is also healing well, and there is no sign of any  significant hematoma or fluid collection around the pacemaker pocket.  Auscultation reveals clear breath sounds which are symmetrical  bilaterally.  No wheezes, rales, or rhonchi are noted.  Cardiovascular  exam demonstrates regular rate and rhythm with mechanical valve heart  sounds.  No murmurs, rubs, or gallops are noted.  The abdomen is soft,  nontender.  The extremities are warm and well perfused.  There is no  lower extremity edema.  The remainder of her physical exam is  unrevealing.   DIAGNOSTIC TESTS:  Chest x-ray performed today at the [facility] is reviewed.  This demonstrates clear lung fields bilaterally  with small residual pleural effusions, right greater than left.  This  has decreased in size from the time of her hospital discharge.  All of  the sternal wires appear intact.  No other significant abnormalities are  noted and the pacemaker leads appear to be in stable position.   IMPRESSION:  Excellent progress following recent Bentall aortic root  replacement.   PLAN:  I have encouraged the patient to continue to gradually increase  her physical activity as tolerated with her only limitations at this  point remaining that she refrain from heavy lifting or strenuous use of  her arms or shoulders for at least another 3 months or so.  I have  encouraged her to go ahead and get involved in the cardiac rehab  program.  I think that within the next week or two she could probably  start driving an automobile if she continues to improve.  We have not  made any changes in her current medications.  I have reminded her that  she must continue to abstain from any smoking indefinitely for the rest  of her life.  All of her questions have been addressed.  We will plan to  see her back in 3 months for routine followup.   Salvatore Decent. Cornelius Moras, M.D.  Electronically Signed   CHO/MEDQ  D:  03/26/2010  T:  03/27/2010  Job:  [id number]   cc:   Sheliah Mends, MD  Thurmon Fair, MD

## 2011-04-16 NOTE — Assessment & Plan Note (Signed)
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  She required  placement of a dual-chamber pacemaker postoperatively due to complete  heart block.  Since hospital discharge, she has continued to do very  well.  She was last seen here in the office on May 07, 2010.  Since  then, she has had no problems at all.  She reports that she is now back  to essentially normal physical activity, and she notes considerable  improvement in her exercise tolerance in comparison with how she felt  prior to surgery.  She no longer has any shortness of breath.  She has  minimal residual soreness in her chest.  She has not had any tachy  palpitations.  Her activity level is quite good and she has no  complaints.  She is doing reasonably well with Coumadin therapy,  although she states that her dose continues to be adjusted up and down a  little bit intermittently through the [facility].  The remainder of her review of systems is unremarkable.   PHYSICAL EXAMINATION:  Notable for well-appearing female with blood  pressure 135/83, pulse 94, oxygen saturation 96% on room air.  Examination of the chest reveals a median sternotomy scar that has  healed nicely.  The sternum is stable on palpation.  Breath sounds are  clear to auscultation and symmetrical bilaterally.  No wheezes, rales,  or rhonchi are noted.  Cardiovascular exam includes regular rate and  rhythm with obvious mechanical heart sounds.  No murmurs, rubs, or  gallops are appreciated.  The abdomen is soft, nontender.  The  extremities are warm and well perfused.   There is no lower extremity  edema.   IMPRESSION:  The patient is doing very well status post Bentall aortic  root replacement in April.   PLAN:  In the future, the patient will call and return to see [place] as  needed.  All of her questions have been addressed.  At this point, she  can return to essentially normal physical activity with only restriction  related to the fact that she is long-term anticoagulated on Coumadin.   Salvatore Decent. Cornelius Moras, M.D.  Electronically Signed   CHO/MEDQ  D:  07/02/2010  T:  07/03/2010  Job:  [id number]   cc:   Sheliah Mends, MD

## 2011-04-16 NOTE — Assessment & Plan Note (Signed)
OFFICE VISIT   [name], [name]  DOB:  03/19/1946                                        Apr 23, 2010  CHART #:  [id number]   HISTORY OF PRESENT ILLNESS:  The patient returns for followup status  post Bentall aortic root replacement on March 07, 2010, for bicuspid  aortic valve with severe aortic stenosis and aneurysmal enlargement of  the ascending thoracic aorta.  Her postoperative course was complicated  by complete heart block that ultimately required placement of a  permanent pacemaker.  She also developed postoperative atrial  fibrillation for which she was treated with amiodarone.  She was  ultimately discharged home.  She was last seen here in the office on  March 26, 2010, at which time she was progressing nicely.  She continued  to do well until last week when she had an episode where she felt her  heart racing very fast.  She presented to the emergency room where  apparently she was seen by Dr. Royann Shivers and her pacemaker was  interrogated.  She was noted to have had an episode of transient  paroxysmal atrial fibrillation.  A chest x-ray performed at that time  also demonstrated the presence of a moderate-sized right pleural  effusion.  She was started on oral prednisone and instructed to follow  up with [place] here in the office today.  She has not had any shortness of  breath.  She has been having headaches.  She notes that her blood  pressure was high at the time she was evaluated in the emergency room,  but she was not told to do anything different with her blood pressure  medications.  She otherwise feels well.  She has not had any further  episodes of tachy palpitations.  She has no shortness of breath.  She  has no cough.  Her appetite is stable.  She otherwise feels pretty well  other than her headaches.   PHYSICAL EXAMINATION:  Notable for well-appearing female with blood  pressure 210/91 on the right, 185/104 on the left; pulse is 86 and  regular; and oxygen saturation 95% on room air.  Examination of the  chest is notable for median sternotomy incision that is healing nicely.  The sternum is stable on palpation.  Breath sounds are clear to  auscultation and symmetrical bilaterally.  No wheezes, rales, or rhonchi  are noted.  Cardiovascular exam is notable for regular rate and rhythm.  Mechanical valve heart sounds are noted.  The abdomen is soft and  nontender.  The extremities are warm and well perfused.  There is no  lower extremity edema.   DIAGNOSTICS TESTS:  Chest x-ray performed today at the [facility] is reviewed.  This demonstrates mild persistent elevation  of the right hemidiaphragm with mild-to-moderate right pleural effusion,  which has decreased substantially in size in comparison with the x-ray  performed last week.   IMPRESSION:  Resolving right pleural effusion.  The patient has  significant hypertension.  It does not sound as though she has had any  further episodes of paroxysmal atrial fibrillation.  She otherwise looks  well.  I suspect her headaches may be related to her elevated blood  pressure.   PLAN:  I have instructed the patient to increase her dose of metoprolol  to 100 mg p.o.  twice daily.  I have also given her a prescription for  lisinopril 10 mg daily.  She is not sure when her next followup  appointment with Dr. Royann Shivers and colleagues at [facility] has been arranged.  I have instructed to make sure that  she get seen there within the next week or two.  She should have her  prothrombin time checked again at that time and to make sure and check  her blood pressure.  We will plan to see her back in 2 weeks.   Salvatore Decent. Cornelius Moras, M.D.  Electronically Signed   CHO/MEDQ  D:  04/23/2010  T:  04/24/2010  Job:  [id number]   cc:   Sheliah Mends, MD  Thurmon Fair, MD

## 2011-04-16 NOTE — H&P (Signed)
HISTORY AND PHYSICAL EXAMINATION   September 11, 2009   Re:  [name] [name], [name] [name]            DOB:  [birth year]   REQUESTING PHYSICIAN:  Sheliah Mends, MD   REASON FOR CONSULTATION:  Severe aortic stenosis.   HISTORY OF PRESENT ILLNESS:  The patient is a 65 year old widowed white  female from [place] with known history of bicuspid aortic valve and  severe aortic stenosis.  The patient was first noted to have a heart  murmur during childhood, and she was told that she had a history of  rheumatic fever.  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  She was noted to have a heart  murmur on exam.  She was seen and evaluated at [facility] and  [facility] by Dr. Lenise Herald.  Transesophageal echocardiogram  apparently revealed bicuspid aortic valve with severe aortic stenosis,  mild aortic regurgitation, and mild mitral regurgitation.  She was  treated medically, but had been lost to follow up from a cardiovascular  standpoint.  More recently, she underwent a routine colonoscopy by Dr.  Anselmo Rod.  Abnormal physical exam and electrocardiogram prompted  referral back to [facility] where she was  seen by Dr. Garen Lah on July 17, 2009.  A 2-D echocardiogram was  performed, which by report confirmed the presence of severe aortic  stenosis.  Specifically, the peak velocity across the aortic valve  measured 525 cm/sec corresponding to peak and mean transvalvular  gradients 110 and 74 mmHg respectively.  Aortic valve area was estimated  to be between 0.63 and 0.71 cm2.  Left ventricular systolic function  remained normal with ejection fraction estimated greater than or equal  to 55%.  There was moderate concentric left ventricular hypertrophy.  There was evidence for impaired left ventricular diastolic function  based on Doppler flow analysis.  There was mild-to-moderate aortic  regurgitation with mild mitral regurgitation, and moderate pulmonary  hypertension.  The patient subsequently underwent elective left and  right heart catheterization by Dr. Garen Lah on August 25, 2009.  The  peak-to-peak gradient across the aortic valve was 92 mmHg with a mean  gradient of 56 mmHg.  Pulmonary artery pressures measured 25/6 with  pulmonary capillary wedge pressure of 9, central venous pressure of 6.  Forward cardiac output remained normal.  There were luminal  irregularities in the coronary arteries, but no significant flow-  limiting coronary artery disease.  Aortic valve area was calculated 0.4  sq.cm.  The patient has been referred for possible elective aortic valve  replacement.   REVIEW OF SYSTEMS:  GENERAL:  The patient reports normal appetite.  She  has actually lost a little weight recently with some dietary changes.  Her energy level is stable.  VITAL SIGNS:  She is 5 feet 8 inches tall and reports weighing  approximately 132 pounds.  CARDIAC:  The patient describes a several year history of mild symptoms  of exertional shortness of breath, functional class II to class III.  She states that she usually only gets short of breath if she is walking  up a flight of stairs.  Her shortness of breath is always promptly  relieved by rest.  She denies any history of resting shortness of  breath, PND, orthopnea, or lower extremity edema.  She denies any  history of chest tightness or chest pressure with activity or at rest.  She reports occasional tachy palpitations.  She denies any dizzy spells  or syncopal episodes.  RESPIRATORY:  Notable for intermittent cough.  The patient has been  treated for mild bronchitis in the past, but none recently.  She denies  any recent productive cough, hemoptysis, or wheezing.  GASTROINTESTINAL:  Notable for some problems with nausea in the  evenings, typically occurs after she takes her cholesterol medicine.  This occasionally will  cause vomiting.  She has no difficulty  swallowing.  She denies hematochezia, hematemesis, or melena.  She did  recently have a colonoscopy and was told that it looked normal.  GENITOURINARY:  Negative.  The patient denies urinary urgency or  frequency.  PERIPHERAL VASCULAR:  Negative.  The patient did have a history of  central retinal artery thrombosis in 2004, for which she remains on  Coumadin.  She has no other history of any blood clotting or disorder  and she has not had any problems with bleeding, diathesis, or other  issues since being treated with Coumadin.  NEUROLOGIC:  Negative.  The patient denies symptoms suggestive of  previous stroke.  MUSCULOSKELETAL:  Negative.  The patient reports only mild arthralgias.  She does have chronic dry rash attributed to eczema.  PSYCHIATRIC:  Notable only for mild nervousness and anxiety.  HEENT:  Negative.  The patient has not seen a dentist in long time.  She  denies any ongoing dental problems, blue teeth or painful teeth.   PAST MEDICAL HISTORY:  1. Bicuspid aortic valve with severe aortic stenosis and mild to      moderate aortic regurgitation.  2. Hyperlipidemia.  3. Chronic obstructive pulmonary disease.  4. Right retinal artery thrombosis.   FAMILY HISTORY:  Noncontributory.   SOCIAL HISTORY:  The patient is widowed and lives alone locally here in  [place].  She has 3 grown children, one of whom accompanies her to  the office today.  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  She has a long history of heavy tobacco  abuse, smoking between 1-1/2 and 2 packs of cigarettes per day for many  years.  Recently, she has cut back somewhat, but she continues to smoke  between a 1/2 and 1 pack of cigarettes daily.  She denies history of  significant alcohol consumption.   CURRENT MEDICATIONS:  1. Coumadin.  2. Pravastatin 20 mg daily.  3. Multivitamin 1 tablet daily.  4. Vitamin D and calcium  1 tablet daily.  5. Chantix 1 tablet daily.   DRUG ALLERGIES:  Penicillin causes rash and itching, Crestor causes  abdominal bloating and cramping.   PHYSICAL EXAMINATION:  General:  The patient is a well-appearing female  who appears as stated age, in no acute distress.  Vital Signs:  Blood  pressure 136/70, pulse 92, and oxygen saturation 98% on room air.  HEENT:  Unrevealing.  There is no palpable lymphadenopathy.  There are  no carotid bruits.  Chest:  Auscultation of the chest demonstrates clear  breath sounds, which are symmetrical bilaterally.  No wheezes or rhonchi  noted.  Cardiovascular:  Demonstrates regular rate and rhythm.  There is  a crescendo-decrescendo honking grade 3-4/6 systolic murmur heard best  along the right sternal border with radiation across precordium and  towards the neck.  No diastolic murmurs are noted.  Abdomen:  Soft,  nondistended, and nontender.  There are no palpable masses.  Bowel  sounds are present.  Extremities: Warm and well perfused.  There is no  lower extremity edema.  Distal pulses are diminished in both lower legs  in the dorsalis pedis and posterior tibial position.  There is no sign  of significant venous insufficiency.  Skin:  Clean, dry, and healthy  appearing throughout.  Rectal and GU:  Both deferred.   DIAGNOSTIC TESTS:  Report of 2-D echocardiogram performed at  [facility] August 09, 2009 is reviewed.  The images from this exam are not currently available, but by report the  patient had bicuspid aortic valve with severe aortic stenosis and mild  to moderate aortic regurgitation.  Left ventricular function was  preserved.  There is mild mitral regurgitation.  There is pulmonary  hypertension.   Left and right heart catheterization performed August 25, 2009 by Dr.  Garen Lah is reviewed.  Results are as noted previously.  In addition,  there is significant dilatation of the aortic root noted on  aortic root  injection.  Left ventricular function is normal.  There is severe aortic  stenosis with valve area estimated 0.4 cm2.  There are luminal  irregularities in the coronary artery circulation, but no significant  flow-limiting lesions.  There is right-dominant coronary circulation.   IMPRESSION:  Bicuspid aortic valve with severe aortic stenosis and  stable symptoms of exertional shortness of breath.  The patient also has  a somewhat enlarged aortic root.  The patient has longstanding history  of tobacco abuse with likely significant underlying chronic obstructive  pulmonary disease.  The patient has been treated with Coumadin since  2004 for central right retinal artery thrombosis.  I agree that she  would best be treated with elective aortic valve replacement.  I suspect  that her aortic root may need to be replaced as well based upon the  appearance on catheterization, although a formal CT angiogram will be  helpful to sort this out.   PLAN:  We will obtain CT angiogram of the thoracic aorta to formally  size the degree of enlargement of the aortic root and proximal ascending  thoracic aorta.  We will obtain formal pulmonary function test to better  characterize respiratory risk.  The patient has been strongly encouraged  to find a way to quit smoking completely.  Finally, we will send her for  dental service consult prior to surgery.  In addition, I have suggested  that she may wish to talk to her ophthalmologist.  If she will need to  remain on Coumadin indefinitely the rest of her life, then I suspect it  would probably make sense to replace her aortic valve using a mechanical  prosthesis to avoid the potential for late structural valve  deterioration failure.  All of her questions have been addressed.   Salvatore Decent. Cornelius Moras, M.D.  Electronically Signed   CHO/MEDQ  D:  09/11/2009  T:  09/12/2009  Job:  [id number]   cc:   Sheliah Mends, MD

## 2011-04-16 NOTE — Assessment & Plan Note (Signed)
OFFICE VISIT   TOCARRA, GASSEN  DOB:  [birth year]                                        May 07, 2010  CHART #:  [id number]   HISTORY OF PRESENT ILLNESS:  This is a pleasant 65 year old Caucasian  female who is status post Bentall procedure on March 07, 2010.  She was  last seen in the office on Apr 23, 2010.  At this time, she apparently  had presented to the emergency room prior to the Apr 23, 2010,  appointment because her heart was racing fast.  She will was found to  have an episode of transient paroxysmal atrial fibrillation as well as a  moderate-sized right pleural effusion.  She was started on oral  prednisone.  She also complaints of headaches and had hypertension.  Her  medications have been adjusted, and she currently has no complaints of  headaches or shortness of breath.  As a matter of fact, she is fairly  very well.   PHYSICAL EXAMINATION:  General:  This is a pleasant 65 year old  Caucasian female who is in no acute distress who is alert, oriented, and  cooperative.  Latest Vital Signs:  BP 122/79, heart rate 86, respiration  18.  Cardiovascular:  Regular rate and rhythm.  No murmurs, gallops, or  rubs.  Sharp valve click appreciated.  Pulmonary:  Clear to auscultation  bilaterally.  No rales, wheezes, or rhonchi.  Abdomen:  Soft, nontender.  Bowel sounds present.  Sternal wound is well healed.  No signs of  infection.  There is some slight bruising over the pacemaker site of the  left chest.  Extremities:  No cyanosis, clubbing, or edema.   DIAGNOSTIC TESTS:  Chest x-ray done today revealed significant decrease  in the right pleural effusion since previous chest x-ray (only a small  right pleural effusion seen, some atelectasis on the right as well).   IMPRESSION AND PLAN:  Overall, the patient continues to progress well  status post Bentall procedure.  Her blood pressure is under much better  control on her current regimen of metoprolol 100  mg p.o. two times  daily, Pacerone 200 mg p.o. daily, hydrochlorothiazide 25 mg p.o. daily,  lisinopril 20 mg p.o. daily, and amlodipine 5 mg p.o. daily.  She will  continue to be followed by Dr. Royann Shivers at his discretion.  In addition,  the patient is on Coumadin for mechanical valve.  Her PT and INR are  monitored by [facility].  She is going to return to see Dr. Cornelius Moras  for follow up in 3 months.  She is to contact the office in the interim  if there are any questions, problems, or concerns.   Salvatore Decent. Cornelius Moras, M.D.  Electronically Signed   DZ/MEDQ  D:  05/07/2010  T:  05/08/2010  Job:  [id number]   cc:   Thurmon Fair, MD  Sheliah Mends, MD

## 2011-04-16 NOTE — H&P (Signed)
HISTORY AND PHYSICAL EXAMINATION   February 12, 2010   Re:  [name] [name], [name] [name]            DOB:  [birth year]   Date of planned hospital admission and surgery is March 07, 2010.   PRESENTING CHIEF COMPLAINT:  Severe aortic stenosis.   HISTORY OF PRESENT ILLNESS:  The patient is a 65 year old widowed white  female from [place] with longstanding history of bicuspid aortic  valve and severe aortic stenosis.  She was initially referred for  consultation in October 2010, and a full consultation report has been  dictated at that time.  Prior to her initial consultation, the patient  had echocardiograms demonstrating severe aortic stenosis with peak  velocity across the aortic valve measured 525 cm/sec corresponding to  peak and mean transvalvular gradients of 110 and 74 mmHg respectively.  The aortic valve area was estimated between 0.63 and 0.71 cm2.  Left  ventricular systolic function remained normal with ejection fraction  estimated 55% and moderate concentric left ventricular hypertrophy.  There is mild-to-moderate aortic regurgitation with mild mitral  regurgitation and moderate pulmonary hypertension.  Left and right heart  catheterization performed August 25, 2009 by Dr. Sheliah Mends  confirmed the presence of severe aortic stenosis with a mean  transvalvular gradient of 56 mmHg and aortic valve area estimated 0.4  cm2.  There was normal coronary artery anatomy with no significant  coronary artery disease.  Subsequent CT angiogram of the thoracic aorta  was performed on September 13, 2009 to evaluate aneurysmal enlargement of  the proximal aorta.  This confirmed the presence of aneurysmal  involvement of the aortic root with a maximum transverse diameter of the  aorta between 4.8 and 4.9 cm.  The patient was referred for dental  consultation.  For a variety of reasons it took several months for her  to eventually undergo dental extraction.  This has finally been  accomplished and she now returns to our office to consider proceeding  with elective surgical intervention.   REVIEW OF SYSTEMS:  GENERAL:  The patient reports normal appetite.  She  has not been gaining nor losing weight recently.  She is 5 feet 8 inches  tall and weighs approximately 132 pounds.  CARDIAC:  The patient describes mild exertional shortness of breath.  These symptoms are stable.  She denies any severe exertional shortness  of breath.  She has not had any tachy palpitations or dizzy spells.  She  states that she sometimes gets dizzy if she stands up quickly.  She has  never had any chest pain.  RESPIRATORY:  The patient reports that over the last few months she has  been doing quite well.  She has not had any recent exacerbations of  productive cough.  She continues to smoke, but she states that she has  cut back severely and now is only smoking 1 or 2 cigarettes daily.  GASTROINTESTINAL:  Negative.  The patient has normal appetite.  She has  no difficulty swallowing.  She denies any hematochezia, hematemesis, or  melena.  She occasionally has problems with hemorrhoids.  GENITOURINARY:  Negative.  PERIPHERAL VASCULAR:  Negative.  NEUROLOGIC:  Negative.  PSYCHIATRIC:  Negative.   PAST MEDICAL HISTORY:  1. Bicuspid aortic valve with severe aortic stenosis and mild-to-      moderate aortic regurgitation.  2. Chronic obstructive pulmonary disease.  3. Hyperlipidemia.  4. Right retinal artery thrombosis, on chronic Coumadin therapy.  5. Eczema.  6. Hemorrhoids.  FAMILY HISTORY:  Noncontributory.   SOCIAL HISTORY:  The patient is widowed and lives locally here in  [place].  She has 3 grown children and one of her daughters  accompanies her to the office again today.  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  She has a  longstanding history of heavy tobacco abuse.  She denies any significant  alcohol consumption.   CURRENT  MEDICATIONS:  1. Coumadin 4 mg daily with exception of 3 mg every Thursday and      Sunday.  2. Pravastatin 20 mg daily.  3. Multivitamin 1 tablet daily.  4. Vitamin D and calcium supplement 1 daily.   DRUG ALLERGIES:  Penicillin and Crestor.   PHYSICAL EXAMINATION:  General:  The patient is a well-appearing female  who appears somewhat older than stated age, but is in no acute distress.  Vital Signs:  Blood pressure 157/86, pulse 98, and oxygen saturation 98%  on room air.  HEENT:  Unrevealing.  Neck:  There is no palpable  lymphadenopathy.  There is no jugular venous distention.  Lungs:  Auscultation of the chest reveals a few inspiratory crackles.  No  wheezes or rhonchi are noted.  Cardiovascular:  Notable for regular rate  and rhythm.  There is a very prominent grade 4/6 crescendo-decrescendo  systolic murmur heard along the sternal border.  No diastolic murmurs  are noted.  Abdomen:  Soft, nondistended, and nontender.  Extremities:  Warm and well perfused.  There is no lower extremity edema.  Distal  pulses are diminished, but thready and palpable at the ankle.  Rectal  and GU:  Both deferred.  Neurologic:  Grossly nonfocal and symmetrical  throughout.   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  The patient also has  history of right retinal artery thrombosis for which she remains on long-  term Coumadin therapy.  She has underlying chronic obstructive pulmonary  disease with severe longstanding tobacco abuse.   PLAN:  We plan to proceed with Bentall aortic root replacement on  Wednesday, March 07, 2010.  Because the patient remains on Coumadin  indefinitely, we will plan to replace her aortic valve using a  mechanical prosthesis valve conduit.  She and her daughter understand  and accept all potential associated risks of surgery  including, but not  limited to risk of death, stroke, myocardial infarction, congestive  heart failure, respiratory failure, pneumonia, bleeding requiring blood  transfusion, arrhythmia, heart block with bradycardia requiring  permanent pacemaker, and late complications related to valve  replacement.  All of her questions have been addressed.   Salvatore Decent. Cornelius Moras, M.D.  Electronically Signed   CHO/MEDQ  D:  02/12/2010  T:  02/13/2010  Job:  [id number]   cc:   Sheliah Mends, MD

## 2011-08-09 HISTORY — PX: US ECHOCARDIOGRAPHY: HXRAD669

## 2012-01-24 ENCOUNTER — Encounter (HOSPITAL_COMMUNITY): Payer: Self-pay

## 2012-01-24 ENCOUNTER — Emergency Department (HOSPITAL_COMMUNITY)
Admission: EM | Admit: 2012-01-24 | Discharge: 2012-01-24 | Disposition: A | Discharge status: Home / Self Care | Payer: Medicare HMO | Attending: Emergency Medicine | Admitting: Emergency Medicine

## 2012-01-24 DIAGNOSIS — I4891 Unspecified atrial fibrillation: Secondary | ICD-10-CM | POA: Insufficient documentation

## 2012-01-24 DIAGNOSIS — I10 Essential (primary) hypertension: Secondary | ICD-10-CM | POA: Insufficient documentation

## 2012-01-24 DIAGNOSIS — Z79899 Other long term (current) drug therapy: Secondary | ICD-10-CM | POA: Insufficient documentation

## 2012-01-24 DIAGNOSIS — H5710 Ocular pain, unspecified eye: Secondary | ICD-10-CM

## 2012-01-24 DIAGNOSIS — I2510 Atherosclerotic heart disease of native coronary artery without angina pectoris: Secondary | ICD-10-CM | POA: Insufficient documentation

## 2012-01-24 DIAGNOSIS — H109 Unspecified conjunctivitis: Secondary | ICD-10-CM | POA: Insufficient documentation

## 2012-01-24 HISTORY — DX: Atherosclerotic heart disease of native coronary artery without angina pectoris: I25.10

## 2012-01-24 HISTORY — DX: Unspecified atrial fibrillation: I48.91

## 2012-01-24 HISTORY — DX: Essential (primary) hypertension: I10

## 2012-01-24 MED ORDER — POLYMYXIN B-TRIMETHOPRIM 10000-0.1 UNIT/ML-% OP SOLN: 1.0000 [drp] | OPHTHALMIC | Status: AC

## 2012-01-24 MED ORDER — IBUPROFEN 600 MG PO TABS: 600.0000 mg | ORAL_TABLET | Freq: Four times a day (QID) | ORAL | Status: AC | PRN

## 2012-01-24 MED ORDER — TETRACAINE HCL 0.5 % OP SOLN
1.0000 [drp] | Freq: Once | OPHTHALMIC | Status: AC
  Administered 2012-01-24: 1 [drp] via OPHTHALMIC
  Filled 2012-01-24: qty 2

## 2012-01-24 MED ORDER — FLUORESCEIN SODIUM 1 MG OP STRP
1.0000 | ORAL_STRIP | Freq: Once | OPHTHALMIC | Status: AC
  Administered 2012-01-24: 1 via OPHTHALMIC
  Filled 2012-01-24: qty 1

## 2012-01-24 NOTE — ED Notes (Signed)
Pt c/o (L) eye pain and swelling x2 hrs. Pt's pcp closed this afternoon, pt reports hx of the same in June, redness and swelling noted

## 2012-01-24 NOTE — ED Provider Notes (Signed)
History     CSN: [id number]  Arrival date & time 01/24/12  1559   First MD Initiated Contact with Patient 01/24/12 1651      No chief complaint on file.   (Consider location/radiation/quality/duration/timing/severity/associated sxs/prior treatment) Patient is a 66 y.o. female presenting with eye pain. The history is provided by the patient.  Eye Pain This is a new problem. The current episode started today. The problem occurs constantly. The problem has been unchanged. Pertinent negatives include no congestion, coughing, fever, nausea, visual change or vomiting. The symptoms are aggravated by nothing. She has tried nothing for the symptoms.    Past Medical History  Diagnosis Date  . Atrial fibrillation   . Hypertension   . Coronary artery disease     Past Surgical History  Procedure Date  . Pacemaker insertion   . Aortic valve replacement     History reviewed. No pertinent family history.  History  Substance Use Topics  . Smoking status: Former Games developer  . Smokeless tobacco: Not on file  . Alcohol Use: No    OB History    Grav Para Term Preterm Abortions TAB SAB Ect Mult Living                  Review of Systems  Constitutional: Negative for fever.  HENT: Negative for congestion.   Eyes: Positive for pain.  Respiratory: Negative for cough.   Gastrointestinal: Negative for nausea and vomiting.  All other systems reviewed and are negative.    Allergies  Crestor; Penicillins; and Latex  Home Medications   Current Outpatient Rx  Name Route Sig Dispense Refill  . ASPIRIN 81 MG PO CHEW Oral Chew 81 mg by mouth daily.    . ATORVASTATIN CALCIUM 40 MG PO TABS Oral Take 40 mg by mouth daily.    Marland Kitchen HYDROCHLOROTHIAZIDE 25 MG PO TABS Oral Take 25 mg by mouth every other day.    Marland Kitchen LISINOPRIL 20 MG PO TABS Oral Take 20 mg by mouth every morning.    Marland Kitchen METOPROLOL TARTRATE 50 MG PO TABS Oral Take 50 mg by mouth 2 (two) times daily.    . ADULT MULTIVITAMIN W/MINERALS CH  Oral Take 1 tablet by mouth daily.    . WARFARIN SODIUM 2.5 MG PO TABS Oral Take 2.5-3.75 mg by mouth daily. Take 3.25 mg daily except Monday take 2.5 mg      BP 147/95  Pulse 73  Temp(Src) 97.6 F (36.4 C) (Oral)  Resp 14  SpO2 98%  Physical Exam  Nursing note and vitals reviewed. Constitutional: She is oriented to person, place, and time. She appears well-developed and well-nourished. No distress.  HENT:  Head: Normocephalic and atraumatic.  Mouth/Throat: Oropharynx is clear and moist.  Eyes: EOM and lids are normal. Pupils are equal, round, and reactive to light. No foreign bodies found. Left eye exhibits no discharge, no exudate and no hordeolum. No foreign body present in the left eye. Left conjunctiva is injected.  Slit lamp exam:      The left eye shows no corneal abrasion, no corneal ulcer, no foreign body and no fluorescein uptake.       Left conjunctiva injected with wateriness of the eye. EOMs intact without pain. No pain with indirect consensual response. No foreign body noted. Left eye IOP 17. Left eye lid inverted without evidence of foreign body.   Neck: Normal range of motion. Neck supple.  Cardiovascular: Normal rate, regular rhythm and normal heart sounds.  Exam reveals  no friction rub.   No murmur heard. Pulmonary/Chest: Effort normal and breath sounds normal. No respiratory distress. She has no wheezes. She has no rales.  Abdominal: Soft. There is no tenderness. There is no rebound and no guarding.  Musculoskeletal: Normal range of motion. She exhibits no edema and no tenderness.  Lymphadenopathy:    She has no cervical adenopathy.  Neurological: She is alert and oriented to person, place, and time.  Skin: Skin is warm and dry. No rash noted.  Psychiatric: She has a normal mood and affect. Her behavior is normal.    ED Course  Procedures (including critical care time)     Clinical Impression:  1. Conjunctivitis 2. Left eye pain and      MDM  43:25  PM 66 year old female with history of atrial fibrillation, hypertension and CAD presenting with left eye pain, redness and irritation that started earlier today when she was walking from her car into her home. She endorses similar symptoms last June. At this time she states that she was told she had "scratches on her eyeball". She denies any change in her vision or blurry vision. She does endorse a foreign body sensation. Patient noted to have conjunctival injection and irritation with watering of her left eye. Visual acuity is 20/30. No gross foreign bodies are noted in the eye or under the lid. She does not have pain with indirect consensual response. Will place as are tetracaine drops were seen stain the eye to assess for any corneal abrasion.  6:42 PM no fluorescein uptake in the left eye. No corneal abrasion or ulceration noted. Intraocular pressure of left eye 17. Pain was improved with tetracaine. Given that she did have pain relief with tetracaine and no pain with indirect consensual response doubt anterior uveitis or pathology in the deeper structures of the eye. Her visual acuity is normal. She has an ophthalmologist and was instructed to call first in the morning for next available followup. Will discharge with Motrin Polytrim drops. It is possible that she has a very small foreign body that cannot be visualized within the eye, but that is not causing any visible abrasion. Patient was given strong return precautions and was discharged in stable condition with planned ophthalmology followup.      Sheran Luz, MD 01/25/12 [phone]

## 2012-01-25 NOTE — ED Provider Notes (Signed)
I saw and evaluated the patient, reviewed the resident's note and I agree with the findings and plan.  Dion Parrow, MD 01/25/12 0918 

## 2013-02-17 ENCOUNTER — Ambulatory Visit: Payer: Self-pay | Admitting: Cardiovascular Disease

## 2013-02-17 DIAGNOSIS — Z7901 Long term (current) use of anticoagulants: Secondary | ICD-10-CM | POA: Insufficient documentation

## 2013-02-17 DIAGNOSIS — Z952 Presence of prosthetic heart valve: Secondary | ICD-10-CM

## 2013-04-19 ENCOUNTER — Ambulatory Visit (INDEPENDENT_AMBULATORY_CARE_PROVIDER_SITE_OTHER): Payer: Medicare HMO | Admitting: Pharmacist Clinician (PhC)/ Clinical Pharmacy Specialist

## 2013-04-19 VITALS — BP 150/70 | HR 84

## 2013-04-19 DIAGNOSIS — Z7901 Long term (current) use of anticoagulants: Secondary | ICD-10-CM

## 2013-04-19 DIAGNOSIS — Z954 Presence of other heart-valve replacement: Secondary | ICD-10-CM

## 2013-04-19 DIAGNOSIS — Z952 Presence of prosthetic heart valve: Secondary | ICD-10-CM

## 2013-04-19 LAB — POCT INR: INR: 3.1

## 2013-05-09 ENCOUNTER — Other Ambulatory Visit: Payer: Self-pay | Admitting: Cardiovascular Disease

## 2013-05-09 DIAGNOSIS — I495 Sick sinus syndrome: Secondary | ICD-10-CM

## 2013-05-09 DIAGNOSIS — I4891 Unspecified atrial fibrillation: Secondary | ICD-10-CM

## 2013-05-09 LAB — PACEMAKER DEVICE OBSERVATION

## 2013-05-17 ENCOUNTER — Encounter: Payer: Self-pay | Admitting: *Deleted

## 2013-05-17 LAB — REMOTE PACEMAKER DEVICE
AL AMPLITUDE: 2.8 mv
AL IMPEDENCE PM: 420 Ohm
AL THRESHOLD: 0.375 V
ATRIAL PACING PM: 58.8
BAMS-0001: 175 {beats}/min
BATTERY VOLTAGE: 2.79 V
RV LEAD AMPLITUDE: 5.6 mv
RV LEAD IMPEDENCE PM: 518 Ohm
RV LEAD THRESHOLD: 0.625 V
VENTRICULAR PACING PM: 0.1

## 2013-05-31 ENCOUNTER — Ambulatory Visit (INDEPENDENT_AMBULATORY_CARE_PROVIDER_SITE_OTHER): Payer: Medicare HMO | Admitting: Pharmacist Clinician (PhC)/ Clinical Pharmacy Specialist

## 2013-05-31 VITALS — BP 120/70 | HR 88

## 2013-05-31 DIAGNOSIS — Z7901 Long term (current) use of anticoagulants: Secondary | ICD-10-CM

## 2013-05-31 DIAGNOSIS — Z954 Presence of other heart-valve replacement: Secondary | ICD-10-CM

## 2013-05-31 DIAGNOSIS — Z952 Presence of prosthetic heart valve: Secondary | ICD-10-CM

## 2013-05-31 LAB — POCT INR: INR: 3.1

## 2013-06-08 ENCOUNTER — Encounter: Payer: Self-pay | Admitting: Cardiovascular Disease

## 2013-06-14 ENCOUNTER — Telehealth: Payer: Self-pay | Admitting: Cardiovascular Disease

## 2013-06-14 DIAGNOSIS — Z79899 Other long term (current) drug therapy: Secondary | ICD-10-CM

## 2013-06-14 DIAGNOSIS — E782 Mixed hyperlipidemia: Secondary | ICD-10-CM

## 2013-06-14 NOTE — Telephone Encounter (Signed)
Returning your call. °

## 2013-06-14 NOTE — Telephone Encounter (Signed)
Returned call.  Pt stated she has an appt w/ Dr. Salena Saner on the 18th and wanted to know if she needs labs before appt.  Pt informed labs ordered and she can present to the lab fasting for completed at least 2 days before appt.  Pt verbalized understanding and agreed w/ plan.  Requistion mailed.

## 2013-06-14 NOTE — Telephone Encounter (Signed)
Returned call.  Left message to call back before 4pm.  

## 2013-06-14 NOTE — Telephone Encounter (Signed)
Pt needs to know if she needs to have her blood work done before her appointment

## 2013-07-16 ENCOUNTER — Telehealth: Payer: Self-pay | Admitting: Cardiovascular Disease

## 2013-07-16 ENCOUNTER — Other Ambulatory Visit: Payer: Self-pay | Admitting: Pharmacist Clinician (PhC)/ Clinical Pharmacy Specialist

## 2013-07-16 MED ORDER — WARFARIN SODIUM 2.5 MG PO TABS: 2.5000 mg | ORAL_TABLET | Freq: Every day | ORAL | Status: DC

## 2013-07-16 NOTE — Telephone Encounter (Signed)
Paper chart received and no refill request or PA.  Message forwarded to K. Alvstad, PharmD.

## 2013-07-16 NOTE — Telephone Encounter (Signed)
rx not going thru electronically, called info to [facility]

## 2013-07-16 NOTE — Telephone Encounter (Signed)
Is calling because [facility] is telling her that  They need prior -auth to refill her warfrain .Marland Kitchen [facility] states they have sent over a request for this and they have not had any communication from [place] abiut this .Marland Kitchen Please Call    Thanks

## 2013-07-17 ENCOUNTER — Encounter (HOSPITAL_COMMUNITY): Payer: Self-pay | Admitting: *Deleted

## 2013-07-17 ENCOUNTER — Emergency Department (HOSPITAL_COMMUNITY)
Admission: EM | Admit: 2013-07-17 | Discharge: 2013-07-17 | Disposition: A | Discharge status: Home / Self Care | Payer: Medicare HMO | Attending: Emergency Medicine | Admitting: Emergency Medicine

## 2013-07-17 DIAGNOSIS — Z950 Presence of cardiac pacemaker: Secondary | ICD-10-CM | POA: Insufficient documentation

## 2013-07-17 DIAGNOSIS — I4891 Unspecified atrial fibrillation: Secondary | ICD-10-CM | POA: Insufficient documentation

## 2013-07-17 DIAGNOSIS — I2510 Atherosclerotic heart disease of native coronary artery without angina pectoris: Secondary | ICD-10-CM | POA: Insufficient documentation

## 2013-07-17 DIAGNOSIS — Z79899 Other long term (current) drug therapy: Secondary | ICD-10-CM | POA: Insufficient documentation

## 2013-07-17 DIAGNOSIS — Z87891 Personal history of nicotine dependence: Secondary | ICD-10-CM | POA: Insufficient documentation

## 2013-07-17 DIAGNOSIS — Z91040 Latex allergy status: Secondary | ICD-10-CM | POA: Insufficient documentation

## 2013-07-17 DIAGNOSIS — M79672 Pain in left foot: Secondary | ICD-10-CM

## 2013-07-17 DIAGNOSIS — R011 Cardiac murmur, unspecified: Secondary | ICD-10-CM | POA: Insufficient documentation

## 2013-07-17 DIAGNOSIS — Z7982 Long term (current) use of aspirin: Secondary | ICD-10-CM | POA: Insufficient documentation

## 2013-07-17 DIAGNOSIS — Z880 Allergy status to penicillin: Secondary | ICD-10-CM | POA: Insufficient documentation

## 2013-07-17 DIAGNOSIS — I10 Essential (primary) hypertension: Secondary | ICD-10-CM | POA: Insufficient documentation

## 2013-07-17 DIAGNOSIS — M25579 Pain in unspecified ankle and joints of unspecified foot: Secondary | ICD-10-CM | POA: Insufficient documentation

## 2013-07-17 MED ORDER — HYDROCODONE-ACETAMINOPHEN 5-325 MG PO TABS: 1.0000 | ORAL_TABLET | ORAL | Status: DC | PRN

## 2013-07-17 MED ORDER — HYDROCODONE-ACETAMINOPHEN 5-325 MG PO TABS
1.0000 | ORAL_TABLET | Freq: Once | ORAL | Status: AC
  Administered 2013-07-17: 1 via ORAL
  Filled 2013-07-17: qty 1

## 2013-07-17 NOTE — ED Notes (Signed)
PT to ED c/o L foot pain starting around 3 pm.  Toes are red and warm to touch, but pt c/o pain to entire foot.

## 2013-07-17 NOTE — ED Provider Notes (Signed)
CSN: [id number]     Arrival date & time 07/17/13  1655 History  This chart was scribed for non-physician practitioner working with Gavin Pound. Oletta Lamas, MD by Greggory Stallion, ED scribe. This patient was seen in room TR05C/TR05C and the patient's care was started at 5:14 PM.   Chief Complaint  Patient presents with  . Foot Pain   The history is provided by the patient. No language interpreter was used.    HPI Comments: [name] [name] is a 67 y.o. female who presents to the Emergency Department complaining of gradual onset, constant aching left foot pain that started around 3 PM today. Pt states she was doing housework before the pain started. She states she has never had this kind of pain in the past. Bearing weight worsens the pain. She has not taken anything for it. Pt denies fever, CP, SOB, ankle pain, calf pain and knee pain as associated symptoms. She is taking Coumadin for a clot in her eye. Pt got her levels checked last month and they were 3.1. No hx of gout. Denies any trauma.  Denies numbness or weakness.    Past Medical History  Diagnosis Date  . Atrial fibrillation   . Hypertension   . Coronary artery disease    Past Surgical History  Procedure Laterality Date  . Pacemaker insertion    . Aortic valve replacement     No family history on file. History  Substance Use Topics  . Smoking status: Former Games developer  . Smokeless tobacco: Not on file  . Alcohol Use: No   OB History   Grav Para Term Preterm Abortions TAB SAB Ect Mult Living                 Review of Systems  Constitutional: Negative for fever.  Respiratory: Negative for shortness of breath.   Cardiovascular: Negative for chest pain.  Musculoskeletal: Positive for arthralgias. Negative for myalgias.  All other systems reviewed and are negative.    Allergies  Crestor; Penicillins; and Latex  Home Medications   Current Outpatient Rx  Name  Route  Sig  Dispense  Refill  . aspirin 81 MG chewable tablet   Oral    Chew 81 mg by mouth daily.         Marland Kitchen atorvastatin (LIPITOR) 40 MG tablet   Oral   Take 40 mg by mouth daily.         . hydrochlorothiazide (HYDRODIURIL) 25 MG tablet   Oral   Take 25 mg by mouth every other day.         . lisinopril (PRINIVIL,ZESTRIL) 20 MG tablet   Oral   Take 20 mg by mouth every morning.         . metoprolol (LOPRESSOR) 50 MG tablet   Oral   Take 50 mg by mouth 2 (two) times daily.         . Multiple Vitamin (MULITIVITAMIN WITH MINERALS) TABS   Oral   Take 1 tablet by mouth daily.         Marland Kitchen warfarin (COUMADIN) 2.5 MG tablet   Oral   Take 1-1.5 tablets (2.5-3.75 mg total) by mouth daily. Take 3.25 mg daily except Monday take 2.5 mg   135 tablet   2    BP 158/88  Pulse 69  Temp(Src) 97.7 F (36.5 C) (Oral)  Resp 18  Ht 5\' 7"  (1.702 m)  Wt 146 lb (66.225 kg)  BMI 22.86 kg/m2  SpO2 100%  Physical Exam  Nursing note and vitals reviewed. Constitutional: She is oriented to person, place, and time. She appears well-developed and well-nourished. No distress.  HENT:  Head: Normocephalic and atraumatic.  Eyes: EOM are normal.  Neck: Normal range of motion. Neck supple. No tracheal deviation present.  Cardiovascular: Normal rate, regular rhythm and normal heart sounds.  Exam reveals no gallop and no friction rub.   Systolic murmur.   Pulmonary/Chest: Effort normal and breath sounds normal. No respiratory distress. She has no wheezes. She has no rales.  Musculoskeletal: Normal range of motion.  Left foot has moderate tenderness to dorsum and medial aspect with no erythema. No swelling noted. Normal sensation to all toes. Normal ROM throughout left ankle. No abscess noted. No cellulitis.   Intact dorsalis pedis, posterior tibia pulses. Soft leg  Compartment  BLE: no palpable cords, erythema, edema, negative Homan sign  Neurological: She is alert and oriented to person, place, and time.  Skin: Skin is warm and dry.  Psychiatric: She has a  normal mood and affect. Her behavior is normal.    ED Course   Procedures (including critical care time)  DIAGNOSTIC STUDIES: Oxygen Saturation is 100% on RA, normal by my interpretation.    COORDINATION OF CARE: 5:20 PM-Discussed treatment plan which includes checking for a blood clot in her leg with pt at bedside and pt agreed to plan. Discussed with the pt that an xray is not necessary since she didn't injure it in any way.   5:34 PM i have discussed pt with attending. Low suspicion for DVT since pt currently on anticoagulants.  Doubt gout due to sudden onset.  Doubt infection, doubt bony fx/dislocation.  Pt is NVI.  Plan to ace wrap, RICE therapy, pain medication, and f/u with PCP, return precaution discussed.  Pt agrees with plan.    Labs Reviewed - No data to display No results found. 1. Left foot pain     MDM  BP 158/88  Pulse 69  Temp(Src) 97.7 F (36.5 C) (Oral)  Resp 18  Ht 5\' 7"  (1.702 m)  Wt 146 lb (66.225 kg)  BMI 22.86 kg/m2  SpO2 100%   I personally performed the services described in this documentation, which was scribed in my presence. The recorded information has been reviewed and is accurate.    Fayrene Helper, PA-C 07/17/13 1735

## 2013-07-18 NOTE — ED Provider Notes (Signed)
Medical screening examination/treatment/procedure(s) were performed by non-physician practitioner and as supervising physician I was immediately available for consultation/collaboration.   Dorothy Landgrebe Y. Yuriko Portales, MD 07/18/13 1737 

## 2013-07-19 ENCOUNTER — Ambulatory Visit: Payer: Medicare HMO | Admitting: Pharmacist Clinician (PhC)/ Clinical Pharmacy Specialist

## 2013-07-19 ENCOUNTER — Ambulatory Visit: Payer: Medicare HMO | Admitting: Cardiovascular Disease

## 2013-07-21 ENCOUNTER — Other Ambulatory Visit (HOSPITAL_COMMUNITY): Payer: Self-pay | Admitting: Cardiovascular Disease

## 2013-07-21 DIAGNOSIS — Z952 Presence of prosthetic heart valve: Secondary | ICD-10-CM

## 2013-07-21 LAB — COMPREHENSIVE METABOLIC PANEL
ALT: 21 U/L (ref 0–35)
AST: 23 U/L (ref 0–37)
Albumin: 4.5 g/dL (ref 3.5–5.2)
Alkaline Phosphatase: 43 U/L (ref 39–117)
BUN: 18 mg/dL (ref 6–23)
CO2: 29 mEq/L (ref 19–32)
Calcium: 10 mg/dL (ref 8.4–10.5)
Chloride: 104 mEq/L (ref 96–112)
Creat: 0.87 mg/dL (ref 0.50–1.10)
Glucose, Bld: 92 mg/dL (ref 70–99)
Potassium: 3.9 mEq/L (ref 3.5–5.3)
Sodium: 141 mEq/L (ref 135–145)
Total Bilirubin: 0.6 mg/dL (ref 0.3–1.2)
Total Protein: 7 g/dL (ref 6.0–8.3)

## 2013-07-21 LAB — LIPID PANEL
Cholesterol: 171 mg/dL (ref 0–200)
HDL: 65 mg/dL (ref >39)
LDL Cholesterol: 93 mg/dL (ref 0–99)
Total CHOL/HDL Ratio: 2.6 Ratio
Triglycerides: 67 mg/dL (ref <150)
VLDL: 13 mg/dL (ref 0–40)

## 2013-07-22 ENCOUNTER — Encounter: Payer: Self-pay | Admitting: *Deleted

## 2013-07-23 ENCOUNTER — Ambulatory Visit (INDEPENDENT_AMBULATORY_CARE_PROVIDER_SITE_OTHER): Payer: Medicare HMO | Admitting: Pharmacist Clinician (PhC)/ Clinical Pharmacy Specialist

## 2013-07-23 ENCOUNTER — Encounter: Payer: Self-pay | Admitting: Cardiovascular Disease

## 2013-07-23 ENCOUNTER — Ambulatory Visit (INDEPENDENT_AMBULATORY_CARE_PROVIDER_SITE_OTHER): Payer: Medicare HMO | Admitting: Cardiovascular Disease

## 2013-07-23 ENCOUNTER — Ambulatory Visit (HOSPITAL_COMMUNITY): Payer: Medicare HMO

## 2013-07-23 VITALS — BP 130/70 | HR 68 | Resp 16 | Ht 66.5 in | Wt 142.0 lb

## 2013-07-23 VITALS — BP 128/70 | HR 84

## 2013-07-23 DIAGNOSIS — E785 Hyperlipidemia, unspecified: Secondary | ICD-10-CM

## 2013-07-23 DIAGNOSIS — I442 Atrioventricular block, complete: Secondary | ICD-10-CM

## 2013-07-23 DIAGNOSIS — Z7901 Long term (current) use of anticoagulants: Secondary | ICD-10-CM

## 2013-07-23 DIAGNOSIS — I4891 Unspecified atrial fibrillation: Secondary | ICD-10-CM

## 2013-07-23 DIAGNOSIS — Z950 Presence of cardiac pacemaker: Secondary | ICD-10-CM

## 2013-07-23 DIAGNOSIS — Z952 Presence of prosthetic heart valve: Secondary | ICD-10-CM

## 2013-07-23 DIAGNOSIS — I471 Supraventricular tachycardia, unspecified: Secondary | ICD-10-CM

## 2013-07-23 DIAGNOSIS — Z954 Presence of other heart-valve replacement: Secondary | ICD-10-CM

## 2013-07-23 DIAGNOSIS — I712 Thoracic aortic aneurysm, without rupture, unspecified: Secondary | ICD-10-CM

## 2013-07-23 LAB — POCT INR: INR: 2.4

## 2013-07-23 NOTE — Patient Instructions (Addendum)
Remote monitoring is used to monitor your Pacemaker of ICD from home. This monitoring reduces the number of office visits required to check your device to one time per year. It allows [place] to keep an eye on the functioning of your device to ensure it is working properly. You are scheduled for a device check from home on 08-11-2013. You may send your transmission at any time that day. If you have a wireless device, the transmission will be sent automatically. After your physician reviews your transmission, you will receive a postcard with your next transmission date.  Your physician recommends that you schedule a follow-up appointment in: 1 year   Thank you for enrolling in MyChart. Please follow the instructions below to securely access your online medical record. MyChart allows you to send messages to your doctor, view your test results, renew your prescriptions, schedule appointments, and more.  How Do I Sign Up? 1. In your Internet browser, go to [email or web address] 2. Click on the New  User? link in the Sign In box.  3. Enter your MyChart Access Code exactly as it appears below. You will not need to use this code after you have completed the sign-up process. If you do not sign up before the expiration date, you must request a new code. MyChart Access Code: [email or web address] Expires: 08/22/2013 11:49 AM  4. Enter the last four digits of your Social Security Number (xxxx) and Date of Birth (mm/dd/yyyy) as indicated and click Next. You will be taken to the next sign-up page. 5. Create a MyChart ID. This will be your MyChart login ID and cannot be changed, so think of one that is secure and easy to remember. 6. Create a MyChart password. You can change your password at any time. 7. Enter your Password Reset Question and Answer and click Next. This can be used at a later time if you forget your password.  8. Select your communication preference, and if applicable enter your e-mail  address. You will receive e-mail notification when new information is available in MyChart by choosing to receive e-mail notifications and filling in your e-mail. 9. Click Sign In. You can now view your medical record.   Additional Information If you have questions, you can email [email or web address] or call [phone] to talk to our MyChart staff. Remember, MyChart is NOT to be used for urgent needs. For medical emergencies, dial 911.

## 2013-07-27 ENCOUNTER — Encounter: Payer: Self-pay | Admitting: Cardiovascular Disease

## 2013-07-27 DIAGNOSIS — I471 Supraventricular tachycardia: Secondary | ICD-10-CM | POA: Insufficient documentation

## 2013-07-27 DIAGNOSIS — E7800 Pure hypercholesterolemia, unspecified: Secondary | ICD-10-CM | POA: Insufficient documentation

## 2013-07-27 DIAGNOSIS — I442 Atrioventricular block, complete: Secondary | ICD-10-CM | POA: Insufficient documentation

## 2013-07-27 DIAGNOSIS — I712 Thoracic aortic aneurysm, without rupture: Secondary | ICD-10-CM | POA: Insufficient documentation

## 2013-07-27 DIAGNOSIS — Z950 Presence of cardiac pacemaker: Secondary | ICD-10-CM | POA: Insufficient documentation

## 2013-07-27 NOTE — Assessment & Plan Note (Signed)
Now only occurs transiently, and virtually no ventricular pacing is noted. 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

## 2013-07-27 NOTE — Assessment & Plan Note (Signed)
Normal device function. She is enrolled in the [employer] remote device monitoring system. Occasional episodes of atrial tachycardia are recorded. No convincing evidence of atrial fibrillation. Normal battery and lead parameters.

## 2013-07-27 NOTE — Progress Notes (Signed)
Patient ID: [name] [name], female   DOB: December 13, 1945, 67 y.o.   MRN: [id number]      Reason for office visit Followup status post aortic valve replacement , aortic aneurysm repair, pacemaker, atrial tachycardia  He has now been about 3-1/2 years since [name] [name] underwent a Bentall repair for severe aortic stenosis and ascending aortic aneurysm associated with a bicuspid aortic valve. She has occasional episodes of shortness of breath that did not appear to be clearly related to activity. Most days she feels well and is able to take care of all her own housework. She denies chest pain, syncope or palpitations. She has not been troubled by lower showed edema. Chest not had any bleeding complications and has not had any symptoms of stroke/TIA or a thrombotic events. She has normal coronary arteries by angiography. Her last pacemaker check was a remote download in June with normal findings other than occasional ectopic atrial tachycardia. Of note her pacemaker was implanted for what appeared to be permanent heart block but, but this resolved late after surgery.    Allergies  Allergen Reactions  . Crestor [Rosuvastatin Calcium] Other (See Comments)    unknown  . Penicillins Other (See Comments)    unknown  . Latex Hives and Rash    Current Outpatient Prescriptions  Medication Sig Dispense Refill  . aspirin 81 MG chewable tablet Chew 81 mg by mouth daily.      Marland Kitchen atorvastatin (LIPITOR) 40 MG tablet Take 40 mg by mouth daily.      . Calcium Carbonate (CALCARB 600 PO) Take 1 tablet by mouth daily.      . hydrochlorothiazide (HYDRODIURIL) 25 MG tablet Take 25 mg by mouth every other day.      Marland Kitchen HYDROcodone-acetaminophen (NORCO/VICODIN) 5-325 MG per tablet Take 1 tablet by mouth every 4 (four) hours as needed for pain.  10 tablet  0  . lisinopril (PRINIVIL,ZESTRIL) 20 MG tablet Take 20 mg by mouth every morning.      . metoprolol (LOPRESSOR) 50 MG tablet Take 50 mg by mouth 2 (two) times daily.       .  Multiple Vitamin (MULITIVITAMIN WITH MINERALS) TABS Take 1 tablet by mouth daily.      Marland Kitchen warfarin (COUMADIN) 2.5 MG tablet Take 1-1.5 tablets (2.5-3.75 mg total) by mouth daily. Take 3.25 mg daily except Monday take 2.5 mg  135 tablet  2   No current facility-administered medications for this visit.    Past Medical History  Diagnosis Date  . Atrial fibrillation   . Hypertension   . Coronary artery disease   . S/P AAA repair 03/07/2010  . Aortic stenosis 03/07/2010    replaced mechanical valve Bentall procedure\  . Systemic hypertension   . Dyslipidemia   . Intermittent complete heart block     H/O    Past Surgical History  Procedure Laterality Date  . Pacemaker insertion  03/13/2010    Medtronic Adapta  . Abdominal aortic aneurysm repair  03/07/2010  . Aortic valve replacement  03/07/2010    mechanical valve Bentall procedure  . US echocardiography  08/09/2011    mod. concentric LVH,LA mildly dilated,ca+ MV,trace TR and AI,mechanical AOV  . Cardiac catheterization  08/25/2009    severe AS, mild CAD    Family History  Problem Relation Age of Onset  . Diabetes Mother   . Hypertension Mother   . Heart failure Brother   . Hypertension Brother   . Diabetes Brother     History  Social History  . Marital Status: Widowed    Spouse Name: N/A    Number of Children: N/A  . Years of Education: N/A   Occupational History  . Not on file.   Social History Main Topics  . Smoking status: Former Games developer  . Smokeless tobacco: Not on file  . Alcohol Use: No  . Drug Use: No  . Sexual Activity: No   Other Topics Concern  . Not on file   Social History Narrative  . No narrative on file    Review of systems: The patient specifically denies any chest pain at rest or with exertion, orthopnea, paroxysmal nocturnal dyspnea, syncope, palpitations, focal neurological deficits, intermittent claudication, lower extremity edema, unexplained weight gain, cough, hemoptysis or wheezing.  The  patient also denies abdominal pain, nausea, vomiting, dysphagia, diarrhea, constipation, polyuria, polydipsia, dysuria, hematuria, frequency, urgency, abnormal bleeding or bruising, fever, chills, unexpected weight changes, mood swings, change in skin or hair texture, change in voice quality, auditory or visual problems, allergic reactions or rashes, new musculoskeletal complaints other than usual "aches and pains".   PHYSICAL EXAM BP 130/70  Pulse 68  Resp 16  Ht 5' 6.5" (1.689 m)  Wt 142 lb (64.411 kg)  BMI 22.58 kg/m2  General: Alert, oriented x3, no distress Head: no evidence of trauma, PERRL, EOMI, no exophtalmos or lid lag, no myxedema, no xanthelasma; normal ears, nose and oropharynx Neck: normal jugular venous pulsations and no hepatojugular reflux; brisk carotid pulses without delay and no carotid bruits Chest: clear to auscultation, no signs of consolidation by percussion or palpation, normal fremitus, symmetrical and full respiratory excursions Cardiovascular: normal position and quality of the apical impulse, regular rhythm, normal first and second heart sounds, crisp prosthetic valve click, grade 2/6 early peaking systolic ejection murmur in the aortic focus, no diastolic murmurs, rubs or gallops Abdomen: no tenderness or distention, no masses by palpation, no abnormal pulsatility or arterial bruits, normal bowel sounds, no hepatosplenomegaly Extremities: no clubbing, cyanosis or edema; 2+ radial, ulnar and brachial pulses bilaterally; 2+ right femoral, posterior tibial and dorsalis pedis pulses; 2+ left femoral, posterior tibial and dorsalis pedis pulses; no subclavian or femoral bruits Neurological: grossly nonfocal   EKG: Atrial paced ventricular sensed rhythm. Long AV delay almost 300 ms, right bundle branch block and left axis deviation  Lipid Panel     Component Value Date/Time   CHOL 171 07/21/2013 1029   TRIG 67 07/21/2013 1029   HDL 65 07/21/2013 1029   CHOLHDL 2.6  07/21/2013 1029   VLDL 13 07/21/2013 1029   LDLCALC 93 07/21/2013 1029    BMET    Component Value Date/Time   NA 141 07/21/2013 1029   K 3.9 07/21/2013 1029   CL 104 07/21/2013 1029   CO2 29 07/21/2013 1029   GLUCOSE 92 07/21/2013 1029   BUN 18 07/21/2013 1029   CREATININE 0.87 07/21/2013 1029   CREATININE 0.72 04/17/2010 1015   CALCIUM 10.0 07/21/2013 1029   GFRNONAA >60 04/17/2010 1015   GFRAA  Value: >60        The eGFR has been calculated using the MDRD equation. This calculation has not been validated in all clinical situations. eGFR's persistently <60 mL/min signify possible Chronic Kidney Disease. 04/17/2010 1015     ASSESSMENT AND PLAN H/O aortic valve replacement, 23 mm Carbomedics Plan to followup prosthetic aortic valve function with an echocardiogram. This will also allow measurement of the size of the ascending aorta to look for evidence of recurrent  aneurysm. We discussed the need for inotropic anticoagulations and how to handle surgical procedures or bleeding complications. We also discussed warfarin drug and food interactions again.  Ascending aortic aneurysm s/p Bentall repair 2011    Complete heart block Now only occurs transiently, and virtually no ventricular pacing is noted. 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  Pacemaker, dual chamber Medtronic Adapta April 2011 Normal device function. She is enrolled in the [facility] remote device monitoring system. Occasional episodes of atrial tachycardia are recorded. No convincing evidence of atrial fibrillation. Normal battery and lead parameters.  Paroxysmal atrial tachycardia I wonder whether her occasional episodes of dyspnea related to this arrhythmia. I have asked her to keep a more detailed log so that we can compare her the findings on her pacemaker checks with her symptoms.  Hyperlipidemia She does not have  CAD. Satisfactory lipid profile.   Orders Placed This Encounter  Procedures  . EKG 12-Lead   [name] [name]  Thurmon Fair, MD, [facility] [phone] office [phone] pager

## 2013-07-27 NOTE — Assessment & Plan Note (Signed)
Plan to followup prosthetic aortic valve function with an echocardiogram. This will also allow measurement of the size of the ascending aorta to look for evidence of recurrent aneurysm. We discussed the need for inotropic anticoagulations and how to handle surgical procedures or bleeding complications. We also discussed warfarin drug and food interactions again.

## 2013-07-27 NOTE — Assessment & Plan Note (Signed)
She does not have CAD. Satisfactory lipid profile.

## 2013-07-27 NOTE — Assessment & Plan Note (Signed)
I wonder whether her occasional episodes of dyspnea related to this arrhythmia. I have asked her to keep a more detailed log so that we can compare her the findings on her pacemaker checks with her symptoms.

## 2013-07-29 ENCOUNTER — Ambulatory Visit (HOSPITAL_COMMUNITY)
Admission: RE | Admit: 2013-07-29 | Discharge: 2013-07-29 | Disposition: A | Discharge status: Home / Self Care | Payer: Medicare HMO | Source: Ambulatory Visit | Attending: Cardiovascular Disease | Admitting: Cardiovascular Disease

## 2013-07-29 DIAGNOSIS — I359 Nonrheumatic aortic valve disorder, unspecified: Secondary | ICD-10-CM

## 2013-07-29 DIAGNOSIS — Z87891 Personal history of nicotine dependence: Secondary | ICD-10-CM | POA: Insufficient documentation

## 2013-07-29 DIAGNOSIS — I4891 Unspecified atrial fibrillation: Secondary | ICD-10-CM | POA: Insufficient documentation

## 2013-07-29 DIAGNOSIS — E785 Hyperlipidemia, unspecified: Secondary | ICD-10-CM | POA: Insufficient documentation

## 2013-07-29 DIAGNOSIS — I10 Essential (primary) hypertension: Secondary | ICD-10-CM | POA: Insufficient documentation

## 2013-07-29 DIAGNOSIS — Z952 Presence of prosthetic heart valve: Secondary | ICD-10-CM

## 2013-07-29 DIAGNOSIS — I2510 Atherosclerotic heart disease of native coronary artery without angina pectoris: Secondary | ICD-10-CM | POA: Insufficient documentation

## 2013-07-29 DIAGNOSIS — I459 Conduction disorder, unspecified: Secondary | ICD-10-CM | POA: Insufficient documentation

## 2013-07-29 NOTE — Progress Notes (Signed)
[facility]   2D echo completed 07/29/2013.   Veda Canning, RDCS

## 2013-08-10 ENCOUNTER — Telehealth: Payer: Self-pay | Admitting: Pharmacist Clinician (PhC)/ Clinical Pharmacy Specialist

## 2013-08-10 NOTE — Telephone Encounter (Signed)
Took regular 2.5mg  dose Monday evening, then woke up around 11:30, thinking she missed meds and took Sunday's 3.75mg  dose also.  Advised pt to skip tonight's dose and all would be well

## 2013-08-15 DIAGNOSIS — I472 Ventricular tachycardia: Secondary | ICD-10-CM

## 2013-08-15 DIAGNOSIS — I4891 Unspecified atrial fibrillation: Secondary | ICD-10-CM

## 2013-08-15 DIAGNOSIS — I442 Atrioventricular block, complete: Secondary | ICD-10-CM

## 2013-08-15 LAB — PACEMAKER DEVICE OBSERVATION

## 2013-08-16 ENCOUNTER — Other Ambulatory Visit: Payer: Self-pay | Admitting: Cardiovascular Disease

## 2013-08-19 ENCOUNTER — Encounter: Payer: Self-pay | Admitting: *Deleted

## 2013-08-19 ENCOUNTER — Ambulatory Visit (INDEPENDENT_AMBULATORY_CARE_PROVIDER_SITE_OTHER): Payer: Medicare HMO | Admitting: Pharmacist Clinician (PhC)/ Clinical Pharmacy Specialist

## 2013-08-19 VITALS — BP 122/66 | HR 84

## 2013-08-19 DIAGNOSIS — Z952 Presence of prosthetic heart valve: Secondary | ICD-10-CM

## 2013-08-19 DIAGNOSIS — Z7901 Long term (current) use of anticoagulants: Secondary | ICD-10-CM

## 2013-08-19 DIAGNOSIS — Z954 Presence of other heart-valve replacement: Secondary | ICD-10-CM

## 2013-08-19 LAB — REMOTE PACEMAKER DEVICE
AL AMPLITUDE: 2.8 mv
AL IMPEDENCE PM: 431 Ohm
AL THRESHOLD: 0.375 V
ATRIAL PACING PM: 60.4
BAMS-0001: 175 {beats}/min
BATTERY VOLTAGE: 2.79 V
RV LEAD AMPLITUDE: 5.6 mv
RV LEAD IMPEDENCE PM: 533 Ohm
RV LEAD THRESHOLD: 0.625 V
VENTRICULAR PACING PM: 0.1

## 2013-08-19 LAB — POCT INR: INR: 2.5

## 2013-08-20 ENCOUNTER — Ambulatory Visit: Payer: Medicare HMO | Admitting: Pharmacist Clinician (PhC)/ Clinical Pharmacy Specialist

## 2013-09-16 ENCOUNTER — Ambulatory Visit (INDEPENDENT_AMBULATORY_CARE_PROVIDER_SITE_OTHER): Payer: Medicare HMO | Admitting: Pharmacist Clinician (PhC)/ Clinical Pharmacy Specialist

## 2013-09-16 VITALS — BP 146/90 | HR 88

## 2013-09-16 DIAGNOSIS — Z952 Presence of prosthetic heart valve: Secondary | ICD-10-CM

## 2013-09-16 DIAGNOSIS — Z954 Presence of other heart-valve replacement: Secondary | ICD-10-CM

## 2013-09-16 DIAGNOSIS — Z7901 Long term (current) use of anticoagulants: Secondary | ICD-10-CM

## 2013-09-16 LAB — POCT INR: INR: 2.4

## 2013-10-07 ENCOUNTER — Ambulatory Visit (INDEPENDENT_AMBULATORY_CARE_PROVIDER_SITE_OTHER): Payer: Medicare HMO | Admitting: Pharmacist Clinician (PhC)/ Clinical Pharmacy Specialist

## 2013-10-07 VITALS — BP 140/76 | HR 84

## 2013-10-07 DIAGNOSIS — Z952 Presence of prosthetic heart valve: Secondary | ICD-10-CM

## 2013-10-07 DIAGNOSIS — Z7901 Long term (current) use of anticoagulants: Secondary | ICD-10-CM

## 2013-10-07 DIAGNOSIS — Z954 Presence of other heart-valve replacement: Secondary | ICD-10-CM

## 2013-10-07 LAB — POCT INR: INR: 2.7

## 2013-10-19 ENCOUNTER — Other Ambulatory Visit: Payer: Self-pay | Admitting: *Deleted

## 2013-10-19 MED ORDER — METOPROLOL TARTRATE 50 MG PO TABS: 50.0000 mg | ORAL_TABLET | Freq: Two times a day (BID) | ORAL | Status: DC

## 2013-10-19 MED ORDER — ATORVASTATIN CALCIUM 40 MG PO TABS: 40.0000 mg | ORAL_TABLET | Freq: Every day | ORAL | Status: DC

## 2013-10-19 MED ORDER — HYDROCHLOROTHIAZIDE 25 MG PO TABS: 25.0000 mg | ORAL_TABLET | ORAL | Status: DC

## 2013-10-20 ENCOUNTER — Other Ambulatory Visit: Payer: Self-pay | Admitting: *Deleted

## 2013-10-20 MED ORDER — LISINOPRIL 20 MG PO TABS: 20.0000 mg | ORAL_TABLET | Freq: Every morning | ORAL | Status: DC

## 2013-10-21 ENCOUNTER — Other Ambulatory Visit: Payer: Self-pay | Admitting: *Deleted

## 2013-10-21 MED ORDER — LISINOPRIL 20 MG PO TABS: 20.0000 mg | ORAL_TABLET | Freq: Every morning | ORAL | Status: DC

## 2013-11-04 ENCOUNTER — Ambulatory Visit (INDEPENDENT_AMBULATORY_CARE_PROVIDER_SITE_OTHER): Payer: Medicare HMO | Admitting: Pharmacist Clinician (PhC)/ Clinical Pharmacy Specialist

## 2013-11-04 VITALS — BP 110/62 | HR 68

## 2013-11-04 DIAGNOSIS — Z954 Presence of other heart-valve replacement: Secondary | ICD-10-CM

## 2013-11-04 DIAGNOSIS — Z7901 Long term (current) use of anticoagulants: Secondary | ICD-10-CM

## 2013-11-04 DIAGNOSIS — Z952 Presence of prosthetic heart valve: Secondary | ICD-10-CM

## 2013-11-04 LAB — POCT INR: INR: 3

## 2013-11-11 ENCOUNTER — Ambulatory Visit (INDEPENDENT_AMBULATORY_CARE_PROVIDER_SITE_OTHER): Payer: Medicare HMO

## 2013-11-11 DIAGNOSIS — I442 Atrioventricular block, complete: Secondary | ICD-10-CM

## 2013-11-11 LAB — PACEMAKER DEVICE OBSERVATION

## 2013-11-13 ENCOUNTER — Encounter: Payer: Self-pay | Admitting: *Deleted

## 2013-11-15 LAB — MDC_IDC_ENUM_SESS_TYPE_REMOTE
Battery Impedance: 201 Ohm
Battery Remaining Longevity: 11
Battery Voltage: 2.79 V
Brady Statistic AP VP Percent: 0.1 % — CL
Brady Statistic AP VS Percent: 59.7 %
Brady Statistic AS VP Percent: 0.1 % — CL
Brady Statistic AS VS Percent: 40.2 %
Lead Channel Impedance Value: 420 Ohm
Lead Channel Impedance Value: 537 Ohm
Lead Channel Pacing Threshold Amplitude: 0.375 V
Lead Channel Pacing Threshold Amplitude: 0.625 V
Lead Channel Pacing Threshold Pulse Width: 0.4 ms
Lead Channel Pacing Threshold Pulse Width: 0.4 ms
Lead Channel Sensing Intrinsic Amplitude: 2.8 mV
Lead Channel Sensing Intrinsic Amplitude: 5.6 mV
Lead Channel Setting Pacing Amplitude: 2 V
Lead Channel Setting Pacing Amplitude: 2 V
Lead Channel Setting Pacing Pulse Width: 0.4 ms
Lead Channel Setting Sensing Sensitivity: 2 mV

## 2013-12-03 ENCOUNTER — Ambulatory Visit (INDEPENDENT_AMBULATORY_CARE_PROVIDER_SITE_OTHER): Payer: Medicare HMO | Admitting: Pharmacist Clinician (PhC)/ Clinical Pharmacy Specialist

## 2013-12-03 VITALS — BP 126/72 | HR 84

## 2013-12-03 DIAGNOSIS — Z952 Presence of prosthetic heart valve: Secondary | ICD-10-CM

## 2013-12-03 DIAGNOSIS — Z7901 Long term (current) use of anticoagulants: Secondary | ICD-10-CM

## 2013-12-03 DIAGNOSIS — Z954 Presence of other heart-valve replacement: Secondary | ICD-10-CM

## 2013-12-03 LAB — POCT INR: INR: 3.8

## 2013-12-24 ENCOUNTER — Ambulatory Visit: Payer: Medicare HMO | Admitting: Pharmacist Clinician (PhC)/ Clinical Pharmacy Specialist

## 2013-12-31 ENCOUNTER — Ambulatory Visit (INDEPENDENT_AMBULATORY_CARE_PROVIDER_SITE_OTHER): Payer: Medicare HMO | Admitting: Pharmacist Clinician (PhC)/ Clinical Pharmacy Specialist

## 2013-12-31 VITALS — BP 130/64 | HR 84

## 2013-12-31 DIAGNOSIS — Z952 Presence of prosthetic heart valve: Secondary | ICD-10-CM

## 2013-12-31 DIAGNOSIS — Z7901 Long term (current) use of anticoagulants: Secondary | ICD-10-CM

## 2013-12-31 DIAGNOSIS — Z954 Presence of other heart-valve replacement: Secondary | ICD-10-CM

## 2013-12-31 LAB — POCT INR: INR: 3.5

## 2014-01-31 ENCOUNTER — Ambulatory Visit (INDEPENDENT_AMBULATORY_CARE_PROVIDER_SITE_OTHER): Payer: Medicare HMO | Admitting: Pharmacist Clinician (PhC)/ Clinical Pharmacy Specialist

## 2014-01-31 VITALS — BP 110/64 | HR 84

## 2014-01-31 DIAGNOSIS — Z7901 Long term (current) use of anticoagulants: Secondary | ICD-10-CM

## 2014-01-31 DIAGNOSIS — Z954 Presence of other heart-valve replacement: Secondary | ICD-10-CM

## 2014-01-31 DIAGNOSIS — Z952 Presence of prosthetic heart valve: Secondary | ICD-10-CM

## 2014-01-31 LAB — POCT INR: INR: 3.5

## 2014-02-14 ENCOUNTER — Ambulatory Visit (INDEPENDENT_AMBULATORY_CARE_PROVIDER_SITE_OTHER): Payer: Medicare HMO | Admitting: *Deleted

## 2014-02-14 DIAGNOSIS — I442 Atrioventricular block, complete: Secondary | ICD-10-CM

## 2014-02-14 LAB — PACEMAKER DEVICE OBSERVATION

## 2014-02-15 LAB — MDC_IDC_ENUM_SESS_TYPE_REMOTE
Battery Impedance: 201 Ohm
Battery Remaining Longevity: 134 mo
Battery Voltage: 2.79 V
Brady Statistic AP VP Percent: 0 %
Brady Statistic AP VS Percent: 58 %
Brady Statistic AS VP Percent: 0 %
Brady Statistic AS VS Percent: 42 %
Date Time Interrogation Session: 20150316233533
Lead Channel Impedance Value: 409 Ohm
Lead Channel Impedance Value: 539 Ohm
Lead Channel Pacing Threshold Amplitude: 0.375 V
Lead Channel Pacing Threshold Amplitude: 0.625 V
Lead Channel Pacing Threshold Pulse Width: 0.4 ms
Lead Channel Pacing Threshold Pulse Width: 0.4 ms
Lead Channel Sensing Intrinsic Amplitude: 1.4 mV
Lead Channel Sensing Intrinsic Amplitude: 5.6 mV
Lead Channel Setting Pacing Amplitude: 2 V
Lead Channel Setting Pacing Amplitude: 2 V
Lead Channel Setting Pacing Pulse Width: 0.4 ms
Lead Channel Setting Sensing Sensitivity: 2 mV

## 2014-02-28 ENCOUNTER — Ambulatory Visit (INDEPENDENT_AMBULATORY_CARE_PROVIDER_SITE_OTHER): Payer: Medicare HMO | Admitting: Pharmacist Clinician (PhC)/ Clinical Pharmacy Specialist

## 2014-02-28 VITALS — BP 118/84 | HR 84

## 2014-02-28 DIAGNOSIS — Z952 Presence of prosthetic heart valve: Secondary | ICD-10-CM

## 2014-02-28 DIAGNOSIS — Z7901 Long term (current) use of anticoagulants: Secondary | ICD-10-CM

## 2014-02-28 DIAGNOSIS — Z954 Presence of other heart-valve replacement: Secondary | ICD-10-CM

## 2014-02-28 NOTE — Progress Notes (Signed)
PPM remote 

## 2014-03-04 ENCOUNTER — Encounter: Payer: Self-pay | Admitting: *Deleted

## 2014-03-22 ENCOUNTER — Other Ambulatory Visit: Payer: Self-pay | Admitting: Cardiovascular Disease

## 2014-03-22 ENCOUNTER — Encounter: Payer: Self-pay | Admitting: Cardiovascular Disease

## 2014-04-11 ENCOUNTER — Ambulatory Visit (INDEPENDENT_AMBULATORY_CARE_PROVIDER_SITE_OTHER): Payer: Medicare HMO | Admitting: Pharmacist Clinician (PhC)/ Clinical Pharmacy Specialist

## 2014-04-11 DIAGNOSIS — Z954 Presence of other heart-valve replacement: Secondary | ICD-10-CM

## 2014-04-11 DIAGNOSIS — Z952 Presence of prosthetic heart valve: Secondary | ICD-10-CM

## 2014-04-11 DIAGNOSIS — Z7901 Long term (current) use of anticoagulants: Secondary | ICD-10-CM

## 2014-04-11 LAB — POCT INR: INR: 3.3

## 2014-05-18 ENCOUNTER — Telehealth: Payer: Self-pay | Admitting: Cardiology

## 2014-05-18 ENCOUNTER — Ambulatory Visit (INDEPENDENT_AMBULATORY_CARE_PROVIDER_SITE_OTHER): Payer: Medicare HMO | Admitting: *Deleted

## 2014-05-18 DIAGNOSIS — I442 Atrioventricular block, complete: Secondary | ICD-10-CM

## 2014-05-18 DIAGNOSIS — Z950 Presence of cardiac pacemaker: Secondary | ICD-10-CM

## 2014-05-18 DIAGNOSIS — I471 Supraventricular tachycardia: Secondary | ICD-10-CM

## 2014-05-18 NOTE — Telephone Encounter (Signed)
LMOVM reminding pt to send remote transmission.   

## 2014-05-19 NOTE — Progress Notes (Signed)
Remote pacemaker transmission.   

## 2014-05-23 ENCOUNTER — Ambulatory Visit (INDEPENDENT_AMBULATORY_CARE_PROVIDER_SITE_OTHER): Payer: Medicare HMO | Admitting: Pharmacist Clinician (PhC)/ Clinical Pharmacy Specialist

## 2014-05-23 DIAGNOSIS — Z952 Presence of prosthetic heart valve: Secondary | ICD-10-CM

## 2014-05-23 DIAGNOSIS — Z954 Presence of other heart-valve replacement: Secondary | ICD-10-CM

## 2014-05-23 DIAGNOSIS — Z7901 Long term (current) use of anticoagulants: Secondary | ICD-10-CM

## 2014-05-23 LAB — POCT INR: INR: 3.8

## 2014-05-25 LAB — MDC_IDC_ENUM_SESS_TYPE_REMOTE
Battery Impedance: 201 Ohm
Battery Remaining Longevity: 134 mo
Battery Voltage: 2.79 V
Brady Statistic AP VP Percent: 0 %
Brady Statistic AP VS Percent: 59 %
Brady Statistic AS VP Percent: 0 %
Brady Statistic AS VS Percent: 41 %
Date Time Interrogation Session: 20150617211228
Lead Channel Impedance Value: 414 Ohm
Lead Channel Impedance Value: 527 Ohm
Lead Channel Pacing Threshold Amplitude: 0.5 V
Lead Channel Pacing Threshold Amplitude: 0.625 V
Lead Channel Pacing Threshold Pulse Width: 0.4 ms
Lead Channel Pacing Threshold Pulse Width: 0.4 ms
Lead Channel Sensing Intrinsic Amplitude: 2.8 mV
Lead Channel Sensing Intrinsic Amplitude: 5.6 mV
Lead Channel Setting Pacing Amplitude: 2 V
Lead Channel Setting Pacing Amplitude: 2 V
Lead Channel Setting Pacing Pulse Width: 0.4 ms
Lead Channel Setting Sensing Sensitivity: 2 mV

## 2014-06-07 ENCOUNTER — Other Ambulatory Visit: Payer: Self-pay | Admitting: Cardiovascular Disease

## 2014-06-07 NOTE — Telephone Encounter (Signed)
Rx was sent to pharmacy electronically. 

## 2014-06-15 ENCOUNTER — Encounter: Payer: Self-pay | Admitting: Cardiology

## 2014-06-21 ENCOUNTER — Encounter: Payer: Self-pay | Admitting: Cardiovascular Disease

## 2014-06-28 ENCOUNTER — Encounter: Payer: Self-pay | Admitting: Cardiovascular Disease

## 2014-07-04 ENCOUNTER — Ambulatory Visit (INDEPENDENT_AMBULATORY_CARE_PROVIDER_SITE_OTHER): Payer: Medicare HMO | Admitting: Pharmacist Clinician (PhC)/ Clinical Pharmacy Specialist

## 2014-07-04 DIAGNOSIS — Z952 Presence of prosthetic heart valve: Secondary | ICD-10-CM

## 2014-07-04 DIAGNOSIS — Z954 Presence of other heart-valve replacement: Secondary | ICD-10-CM

## 2014-07-04 DIAGNOSIS — Z7901 Long term (current) use of anticoagulants: Secondary | ICD-10-CM

## 2014-07-04 LAB — POCT INR: INR: 4.5

## 2014-07-15 ENCOUNTER — Encounter: Payer: Self-pay | Admitting: Cardiovascular Disease

## 2014-07-18 ENCOUNTER — Ambulatory Visit (INDEPENDENT_AMBULATORY_CARE_PROVIDER_SITE_OTHER): Payer: Medicare HMO | Admitting: Pharmacist Clinician (PhC)/ Clinical Pharmacy Specialist

## 2014-07-18 DIAGNOSIS — Z952 Presence of prosthetic heart valve: Secondary | ICD-10-CM

## 2014-07-18 DIAGNOSIS — Z954 Presence of other heart-valve replacement: Secondary | ICD-10-CM

## 2014-07-18 DIAGNOSIS — Z7901 Long term (current) use of anticoagulants: Secondary | ICD-10-CM

## 2014-07-18 LAB — POCT INR: INR: 2.6

## 2014-07-22 ENCOUNTER — Telehealth: Payer: Self-pay | Admitting: Cardiovascular Disease

## 2014-07-22 NOTE — Telephone Encounter (Addendum)
Spoke with pt, she has an appointment with dr c on 08-11-14. Questions regarding lovenox bridging and antibiotics prior to the procedure answered. She will get a copy of her labs done recently at her PCP to bring to that appt.

## 2014-07-22 NOTE — Telephone Encounter (Signed)
Pt is scheduled for Colonoscopy on 08-15-14. She wants to know if she need to take antibiotics,or what she need to do.

## 2014-07-28 ENCOUNTER — Telehealth: Payer: Self-pay | Admitting: Cardiovascular Disease

## 2014-07-28 NOTE — Telephone Encounter (Signed)
Spoke to patient   patient states she is schedule to have colonoscopy on Sept 14, 2015. She needs lovenox  Injection prior to procedure.she states she is unable to give herself injections and wanted to know if she can have someone at office to do it for her. RN Informed patient ,will defer instructions to [facility]. Patient aware, will call back with instruction

## 2014-07-28 NOTE — Telephone Encounter (Signed)
Please call, she need to get Lovenox shots and she does know where to get them.Erasmo Downer said she does not so them.

## 2014-08-01 NOTE — Telephone Encounter (Signed)
Spoke with patient, she is still unsure about having the colonoscopy.  She previously had neighbor, sister or husband to help with injections, does not feel comfortable doing herself.  I explained that I could easily teach her, even help with the first injection.  She said she will let me know by end of week whether she is going to postpone the procedure or not.

## 2014-08-10 ENCOUNTER — Telehealth: Payer: Self-pay | Admitting: Cardiovascular Disease

## 2014-08-10 DIAGNOSIS — Z79899 Other long term (current) drug therapy: Secondary | ICD-10-CM

## 2014-08-10 DIAGNOSIS — R5381 Other malaise: Secondary | ICD-10-CM

## 2014-08-10 DIAGNOSIS — R5383 Other fatigue: Secondary | ICD-10-CM

## 2014-08-10 DIAGNOSIS — E782 Mixed hyperlipidemia: Secondary | ICD-10-CM

## 2014-08-10 NOTE — Telephone Encounter (Signed)
Please mail  out a lab order to [facility] before here appt on 10/25/2014.. Thanks

## 2014-08-10 NOTE — Telephone Encounter (Signed)
Order placed for fasting labs and mail to patient.

## 2014-08-11 ENCOUNTER — Encounter: Payer: Medicare HMO | Admitting: Cardiovascular Disease

## 2014-08-19 ENCOUNTER — Other Ambulatory Visit: Payer: Self-pay | Admitting: Cardiovascular Disease

## 2014-08-19 NOTE — Telephone Encounter (Signed)
Rx was sent to pharmacy electronically. Patient has appointment 10/25/14

## 2014-08-22 ENCOUNTER — Ambulatory Visit (INDEPENDENT_AMBULATORY_CARE_PROVIDER_SITE_OTHER): Payer: Medicare HMO | Admitting: Pharmacist Clinician (PhC)/ Clinical Pharmacy Specialist

## 2014-08-22 DIAGNOSIS — Z952 Presence of prosthetic heart valve: Secondary | ICD-10-CM

## 2014-08-22 DIAGNOSIS — Z7901 Long term (current) use of anticoagulants: Secondary | ICD-10-CM

## 2014-08-22 DIAGNOSIS — Z954 Presence of other heart-valve replacement: Secondary | ICD-10-CM

## 2014-08-22 LAB — POCT INR: INR: 3.5

## 2014-09-06 ENCOUNTER — Other Ambulatory Visit: Payer: Self-pay

## 2014-09-06 DIAGNOSIS — Z1231 Encounter for screening mammogram for malignant neoplasm of breast: Secondary | ICD-10-CM

## 2014-09-15 ENCOUNTER — Ambulatory Visit: Payer: Medicare HMO

## 2014-09-19 ENCOUNTER — Ambulatory Visit (INDEPENDENT_AMBULATORY_CARE_PROVIDER_SITE_OTHER): Payer: Medicare HMO | Admitting: Pharmacist Clinician (PhC)/ Clinical Pharmacy Specialist

## 2014-09-19 DIAGNOSIS — Z952 Presence of prosthetic heart valve: Secondary | ICD-10-CM

## 2014-09-19 DIAGNOSIS — Z7901 Long term (current) use of anticoagulants: Secondary | ICD-10-CM

## 2014-09-19 DIAGNOSIS — Z954 Presence of other heart-valve replacement: Secondary | ICD-10-CM

## 2014-09-19 LAB — POCT INR: INR: 3.6

## 2014-10-18 ENCOUNTER — Ambulatory Visit
Admission: RE | Admit: 2014-10-18 | Discharge: 2014-10-18 | Disposition: A | Discharge status: Home / Self Care | Payer: Medicare HMO | Source: Ambulatory Visit

## 2014-10-18 ENCOUNTER — Encounter (INDEPENDENT_AMBULATORY_CARE_PROVIDER_SITE_OTHER): Payer: Self-pay

## 2014-10-18 DIAGNOSIS — Z1231 Encounter for screening mammogram for malignant neoplasm of breast: Secondary | ICD-10-CM

## 2014-10-22 LAB — LIPID PANEL
Cholesterol: 162 mg/dL (ref 0–200)
HDL: 69 mg/dL (ref >39)
LDL Cholesterol: 79 mg/dL (ref 0–99)
Total CHOL/HDL Ratio: 2.3 Ratio
Triglycerides: 69 mg/dL (ref <150)
VLDL: 14 mg/dL (ref 0–40)

## 2014-10-22 LAB — COMPREHENSIVE METABOLIC PANEL
ALT: 17 U/L (ref 0–35)
AST: 21 U/L (ref 0–37)
Albumin: 4.1 g/dL (ref 3.5–5.2)
Alkaline Phosphatase: 43 U/L (ref 39–117)
BUN: 16 mg/dL (ref 6–23)
CO2: 30 mEq/L (ref 19–32)
Calcium: 9.3 mg/dL (ref 8.4–10.5)
Chloride: 104 mEq/L (ref 96–112)
Creat: 0.94 mg/dL (ref 0.50–1.10)
Glucose, Bld: 88 mg/dL (ref 70–99)
Potassium: 4.2 mEq/L (ref 3.5–5.3)
Sodium: 142 mEq/L (ref 135–145)
Total Bilirubin: 0.5 mg/dL (ref 0.2–1.2)
Total Protein: 6.6 g/dL (ref 6.0–8.3)

## 2014-10-22 LAB — CBC
HCT: 33.1 % — ABNORMAL LOW (ref 36.0–46.0)
Hemoglobin: 11.3 g/dL — ABNORMAL LOW (ref 12.0–15.0)
MCH: 29 pg (ref 26.0–34.0)
MCHC: 34.1 g/dL (ref 30.0–36.0)
MCV: 84.9 fL (ref 78.0–100.0)
MPV: 9.5 fL (ref 9.4–12.4)
Platelets: 202 10*3/uL (ref 150–400)
RBC: 3.9 MIL/uL (ref 3.87–5.11)
RDW: 14.9 % (ref 11.5–15.5)
WBC: 4.6 10*3/uL (ref 4.0–10.5)

## 2014-10-22 LAB — TSH: TSH: 1.459 u[IU]/mL (ref 0.350–4.500)

## 2014-10-25 ENCOUNTER — Ambulatory Visit (INDEPENDENT_AMBULATORY_CARE_PROVIDER_SITE_OTHER): Payer: Medicare HMO | Admitting: Cardiovascular Disease

## 2014-10-25 ENCOUNTER — Ambulatory Visit (INDEPENDENT_AMBULATORY_CARE_PROVIDER_SITE_OTHER): Payer: Medicare HMO | Admitting: *Deleted

## 2014-10-25 ENCOUNTER — Encounter: Payer: Self-pay | Admitting: Cardiovascular Disease

## 2014-10-25 VITALS — BP 140/80 | HR 83 | Resp 16 | Ht 66.0 in | Wt 143.5 lb

## 2014-10-25 DIAGNOSIS — Z952 Presence of prosthetic heart valve: Secondary | ICD-10-CM

## 2014-10-25 DIAGNOSIS — I712 Thoracic aortic aneurysm, without rupture: Secondary | ICD-10-CM

## 2014-10-25 DIAGNOSIS — I442 Atrioventricular block, complete: Secondary | ICD-10-CM

## 2014-10-25 DIAGNOSIS — Z954 Presence of other heart-valve replacement: Secondary | ICD-10-CM

## 2014-10-25 DIAGNOSIS — I471 Supraventricular tachycardia: Secondary | ICD-10-CM

## 2014-10-25 DIAGNOSIS — Z7901 Long term (current) use of anticoagulants: Secondary | ICD-10-CM

## 2014-10-25 DIAGNOSIS — I7121 Aneurysm of the ascending aorta, without rupture: Secondary | ICD-10-CM

## 2014-10-25 DIAGNOSIS — Z950 Presence of cardiac pacemaker: Secondary | ICD-10-CM

## 2014-10-25 LAB — MDC_IDC_ENUM_SESS_TYPE_INCLINIC
Battery Impedance: 224 Ohm
Battery Remaining Longevity: 130 mo
Battery Voltage: 2.79 V
Brady Statistic AP VP Percent: 0 %
Brady Statistic AP VS Percent: 59 %
Brady Statistic AS VP Percent: 0 %
Brady Statistic AS VS Percent: 41 %
Date Time Interrogation Session: 20151124090628
Lead Channel Impedance Value: 409 Ohm
Lead Channel Impedance Value: 522 Ohm
Lead Channel Pacing Threshold Amplitude: 0.5 V
Lead Channel Pacing Threshold Amplitude: 0.75 V
Lead Channel Pacing Threshold Pulse Width: 0.4 ms
Lead Channel Pacing Threshold Pulse Width: 0.4 ms
Lead Channel Sensing Intrinsic Amplitude: 2 mV
Lead Channel Sensing Intrinsic Amplitude: 4 mV
Lead Channel Setting Pacing Amplitude: 2 V
Lead Channel Setting Pacing Amplitude: 2 V
Lead Channel Setting Pacing Pulse Width: 0.4 ms
Lead Channel Setting Sensing Sensitivity: 2 mV

## 2014-10-25 LAB — POCT INR: INR: 3.9

## 2014-10-25 NOTE — Patient Instructions (Signed)
Remote monitoring is used to monitor your pacemaker from home. This monitoring reduces the number of office visits required to check your device to one time per year. It allows [place] to keep an eye on the functioning of your device to ensure it is working properly. You are scheduled for a device check from home on 01-26-2015. You may send your transmission at any time that day. If you have a wireless device, the transmission will be sent automatically. After your physician reviews your transmission, you will receive a postcard with your next transmission date.  Your physician recommends that you schedule a follow-up appointment in: 12 months with Dr.Croitoru

## 2014-10-25 NOTE — Progress Notes (Signed)
Patient ID: [name] [name], female   DOB: 04-Feb-1946, 68 y.o.   MRN: [id number]     Reason for office visit Followup status post aortic valve replacement , aortic aneurysm repair, pacemaker, atrial tachycardia  It's been 3-1/2 years since [name] [name] underwent a Bentall repair for severe aortic stenosis and ascending aortic aneurysm associated with a bicuspid aortic valve. She feels well and denies problems with shortness of breath. She has occasional positional dizziness. She denies chest pain, syncope or palpitations or lower extremity edema. She's not had any bleeding complications and has not had any symptoms of stroke/TIA. She has normal coronary arteries by angiography. Her last pacemaker check was a remote download in June with normal findings other than occasional ectopic atrial tachycardia. Of note her pacemaker was implanted for what appeared to be permanent heart block but this resolved late after surgery.  Pacemaker check today shows normal function. She has roughly 60% atrial pacing and never requires ventricular pacing. Heart rate histogram shows normal heart rate distribution. She has only had 7 episodes of mode switch all of which were less than 1 minute in duration. Lead parameters are excellent and generator longevity is estimated at around 11 years.   Allergies  Allergen Reactions  . Crestor [Rosuvastatin Calcium] Other (See Comments)    unknown  . Penicillins Other (See Comments)    unknown  . Latex Hives and Rash    Current Outpatient Prescriptions  Medication Sig Dispense Refill  . aspirin 81 MG chewable tablet Chew 81 mg by mouth daily.    Marland Kitchen atorvastatin (LIPITOR) 40 MG tablet TAKE 1 TABLET EVERY DAY 90 tablet 0  . Calcium Carbonate (CALCARB 600 PO) Take 1 tablet by mouth daily.    . hydrochlorothiazide (HYDRODIURIL) 25 MG tablet Take 1 tablet (25 mg total) by mouth every other day. 90 tablet 2  . lisinopril (PRINIVIL,ZESTRIL) 20 MG tablet TAKE 1 TABLET EVERY MORNING 90 tablet  0  . metoprolol (LOPRESSOR) 50 MG tablet TAKE 1 TABLET TWICE DAILY 180 tablet 0  . Multiple Vitamin (MULITIVITAMIN WITH MINERALS) TABS Take 1 tablet by mouth daily.    Marland Kitchen warfarin (COUMADIN) 2.5 MG tablet TAKE 1 AND 1/2 TABLETS EVERY DAY OR AS DIRECTED 135 tablet 2   No current facility-administered medications for this visit.    Past Medical History  Diagnosis Date  . Atrial fibrillation   . Hypertension   . Coronary artery disease   . S/P AAA repair 03/07/2010  . Aortic stenosis 03/07/2010    replaced mechanical valve Bentall procedure\  . Systemic hypertension   . Dyslipidemia   . Intermittent complete heart block     H/O    Past Surgical History  Procedure Laterality Date  . Pacemaker insertion  03/13/2010    Medtronic Adapta  . Abdominal aortic aneurysm repair  03/07/2010  . Aortic valve replacement  03/07/2010    mechanical valve Bentall procedure  . US echocardiography  08/09/2011    mod. concentric LVH,LA mildly dilated,ca+ MV,trace TR and AI,mechanical AOV  . Cardiac catheterization  08/25/2009    severe AS, mild CAD    Family History  Problem Relation Age of Onset  . Diabetes Mother   . Hypertension Mother   . Heart failure Brother   . Hypertension Brother   . Diabetes Brother     History   Social History  . Marital Status: Widowed    Spouse Name: N/A    Number of Children: N/A  . Years of Education: N/A  Occupational History  . Not on file.   Social History Main Topics  . Smoking status: Former Research scientist (life sciences)  . Smokeless tobacco: Not on file  . Alcohol Use: No  . Drug Use: No  . Sexual Activity: No   Other Topics Concern  . Not on file   Social History Narrative    Review of systems: The patient specifically denies any chest pain at rest or with exertion, dyspnea at rest or with exertion, orthopnea, paroxysmal nocturnal dyspnea, syncope, palpitations, focal neurological deficits, intermittent claudication, lower extremity edema, unexplained weight gain,  cough, hemoptysis or wheezing.  The patient also denies abdominal pain, nausea, vomiting, dysphagia, diarrhea, constipation, polyuria, polydipsia, dysuria, hematuria, frequency, urgency, abnormal bleeding or bruising, fever, chills, unexpected weight changes, mood swings, change in skin or hair texture, change in voice quality, auditory or visual problems, allergic reactions or rashes, new musculoskeletal complaints other than usual "aches and pains".   PHYSICAL EXAM BP 140/80 mmHg  Pulse 83  Resp 16  Ht 5' 6"  (1.676 m)  Wt 143 lb 8 oz (65.091 kg)  BMI 23.17 kg/m2  General: Alert, oriented x3, no distress Head: no evidence of trauma, PERRL, EOMI, no exophtalmos or lid lag, no myxedema, no xanthelasma; normal ears, nose and oropharynx Neck: normal jugular venous pulsations and no hepatojugular reflux; brisk carotid pulses without delay and no carotid bruits Chest: clear to auscultation, no signs of consolidation by percussion or palpation, normal fremitus, symmetrical and full respiratory excursions; left subclavian pacemaker scar and sternotomy scar Cardiovascular: normal position and quality of the apical impulse, regular rhythm, normal first and widely split second heart sounds with crisp prosthetic valve clicks, no murmurs, rubs or gallops Abdomen: no tenderness or distention, no masses by palpation, no abnormal pulsatility or arterial bruits, normal bowel sounds, no hepatosplenomegaly Extremities: no clubbing, cyanosis or edema; 2+ radial, ulnar and brachial pulses bilaterally; 2+ right femoral, posterior tibial and dorsalis pedis pulses; 2+ left femoral, posterior tibial and dorsalis pedis pulses; no subclavian or femoral bruits Neurological: grossly nonfocal   EKG: atrial paced, ventricular sensed, right bundle branch block and left axis deviation just shy of left anterior fascicular block, very long AV delay (296 ms  Lipid Panel     Component Value Date/Time   CHOL 162 10/21/2014  1034   TRIG 69 10/21/2014 1034   HDL 69 10/21/2014 1034   CHOLHDL 2.3 10/21/2014 1034   VLDL 14 10/21/2014 1034   LDLCALC 79 10/21/2014 1034    BMET    Component Value Date/Time   NA 142 10/21/2014 1034   K 4.2 10/21/2014 1034   CL 104 10/21/2014 1034   CO2 30 10/21/2014 1034   GLUCOSE 88 10/21/2014 1034   BUN 16 10/21/2014 1034   CREATININE 0.94 10/21/2014 1034   CREATININE 0.72 04/17/2010 1015   CALCIUM 9.3 10/21/2014 1034   GFRNONAA >60 04/17/2010 1015   GFRAA  04/17/2010 1015    >60        The eGFR has been calculated using the MDRD equation. This calculation has not been validated in all clinical situations. eGFR's persistently <60 mL/min signify possible Chronic Kidney Disease.     ASSESSMENT AND PLAN  H/O aortic valve replacement, 23 mm Carbomedics No problems with anticoagulation, reminded her of need for endocarditis prevention. Normal valve function by echocardiogram last year.  Ascending aortic aneurysm s/p Bentall repair 2011  Complete heart block Now virtually no ventricular pacing is noted. She does have residual right bundle branch block and  borderline criteria for left anterior fascicular block and clearly has prolonged AV node conduction.  Pacemaker, dual chamber Medtronic Adapta April 2011 Normal device function. She is enrolled in the [employer] remote device monitoring system. Occasional episodes of atrial tachycardia are recorded. No convincing evidence of atrial fibrillation. Normal battery and lead parameters.  Paroxysmal atrial tachycardia Not sure if these were the cause of her previous random episodes of transient dyspnea. She has had very little atrial tachycardia recently.  Hyperlipidemia She does not have CAD. Satisfactory lipid profile.  Orders Placed This Encounter  Procedures  . Implantable device check  . EKG 12-Lead   No orders of the defined types were placed in this encounter.    Holli Humbles, MD,  [place] [phone] office [phone] pager

## 2014-11-04 ENCOUNTER — Encounter: Payer: Self-pay | Admitting: Cardiology

## 2014-11-07 ENCOUNTER — Other Ambulatory Visit: Payer: Self-pay | Admitting: Pharmacist Clinician (PhC)/ Clinical Pharmacy Specialist

## 2014-11-07 ENCOUNTER — Ambulatory Visit (INDEPENDENT_AMBULATORY_CARE_PROVIDER_SITE_OTHER): Payer: Medicare HMO | Admitting: Pharmacist Clinician (PhC)/ Clinical Pharmacy Specialist

## 2014-11-07 ENCOUNTER — Other Ambulatory Visit: Payer: Self-pay | Admitting: Cardiovascular Disease

## 2014-11-07 DIAGNOSIS — Z7901 Long term (current) use of anticoagulants: Secondary | ICD-10-CM

## 2014-11-07 DIAGNOSIS — Z954 Presence of other heart-valve replacement: Secondary | ICD-10-CM

## 2014-11-07 DIAGNOSIS — Z952 Presence of prosthetic heart valve: Secondary | ICD-10-CM

## 2014-11-07 LAB — POCT INR: INR: 2.6

## 2014-11-08 NOTE — Telephone Encounter (Signed)
Rx was sent to pharmacy electronically. 

## 2014-12-05 ENCOUNTER — Ambulatory Visit (INDEPENDENT_AMBULATORY_CARE_PROVIDER_SITE_OTHER): Payer: Medicare HMO | Admitting: Pharmacist Clinician (PhC)/ Clinical Pharmacy Specialist

## 2014-12-05 DIAGNOSIS — Z7901 Long term (current) use of anticoagulants: Secondary | ICD-10-CM

## 2014-12-05 DIAGNOSIS — Z952 Presence of prosthetic heart valve: Secondary | ICD-10-CM

## 2014-12-05 DIAGNOSIS — Z954 Presence of other heart-valve replacement: Secondary | ICD-10-CM

## 2014-12-05 LAB — POCT INR: INR: 2.3

## 2014-12-05 MED ORDER — ENOXAPARIN SODIUM 100 MG/ML ~~LOC~~ SOLN: 100.0000 mg | SUBCUTANEOUS | Status: DC

## 2014-12-05 NOTE — Patient Instructions (Signed)
Enoxaprin Dosing Schedule  Enoxparin dose: 100mg   Date  Warfarin Dose (evenings) Enoxaprin Dose  1-7 6 1.5 tabs   1-8 5 0   1-9 4 0   1-10 3 0 8am  1-11 2 0 8am  1-12 1 0 8am  1-13 Procedure 1 tab   1-14 1 2  tabs 8am  1-15 2 2  tabs 8am  1-16 3 2  tabs 8am  1-17 4 2  tabs 8am  1-18 5 Repeat INR 8am  1-19 6

## 2014-12-07 ENCOUNTER — Telehealth: Payer: Self-pay | Admitting: Pharmacist Clinician (PhC)/ Clinical Pharmacy Specialist

## 2014-12-07 NOTE — Telephone Encounter (Signed)
Pt called, LMOM, cannot afford lovenox injections.  Cost is $276.  Also has fear of self-injection, is trying to find someone to help with this.   Returned call, no VM

## 2014-12-09 NOTE — Telephone Encounter (Signed)
Pt cancelled colonoscopy

## 2014-12-19 ENCOUNTER — Ambulatory Visit (INDEPENDENT_AMBULATORY_CARE_PROVIDER_SITE_OTHER): Payer: Medicare HMO | Admitting: Pharmacist Clinician (PhC)/ Clinical Pharmacy Specialist

## 2014-12-19 DIAGNOSIS — Z952 Presence of prosthetic heart valve: Secondary | ICD-10-CM

## 2014-12-19 DIAGNOSIS — Z954 Presence of other heart-valve replacement: Secondary | ICD-10-CM

## 2014-12-19 DIAGNOSIS — Z7901 Long term (current) use of anticoagulants: Secondary | ICD-10-CM

## 2014-12-19 LAB — POCT INR: INR: 2.9

## 2015-01-16 ENCOUNTER — Ambulatory Visit: Payer: Medicare HMO | Admitting: Pharmacist Clinician (PhC)/ Clinical Pharmacy Specialist

## 2015-01-23 ENCOUNTER — Ambulatory Visit (INDEPENDENT_AMBULATORY_CARE_PROVIDER_SITE_OTHER): Payer: Medicare HMO | Admitting: Pharmacist Clinician (PhC)/ Clinical Pharmacy Specialist

## 2015-01-23 DIAGNOSIS — Z952 Presence of prosthetic heart valve: Secondary | ICD-10-CM

## 2015-01-23 DIAGNOSIS — Z7901 Long term (current) use of anticoagulants: Secondary | ICD-10-CM

## 2015-01-23 DIAGNOSIS — Z954 Presence of other heart-valve replacement: Secondary | ICD-10-CM

## 2015-01-23 LAB — POCT INR: INR: 2.2

## 2015-01-24 ENCOUNTER — Telehealth: Payer: Self-pay | Admitting: Cardiovascular Disease

## 2015-01-24 MED ORDER — AMOXICILLIN 500 MG PO TABS: ORAL_TABLET | ORAL | Status: DC

## 2015-01-24 NOTE — Telephone Encounter (Signed)
Pt called in stating that she will need a prescription for Amoxicillin for a tooth she is trying to keep from getting infected. Please call  Thanks

## 2015-01-24 NOTE — Telephone Encounter (Signed)
Spoke with pt, she has a chipped tooth and her filling has fallen out. She has an appointment with the dentistin march and needs a script for antibiotics for prior to the procedure. Script sent to the pharmacy.

## 2015-01-26 ENCOUNTER — Telehealth: Payer: Self-pay | Admitting: Cardiology

## 2015-01-26 ENCOUNTER — Ambulatory Visit (INDEPENDENT_AMBULATORY_CARE_PROVIDER_SITE_OTHER): Payer: Medicare HMO | Admitting: *Deleted

## 2015-01-26 DIAGNOSIS — I442 Atrioventricular block, complete: Secondary | ICD-10-CM

## 2015-01-26 NOTE — Telephone Encounter (Signed)
LMOVM reminding pt to send remote transmission.   

## 2015-01-27 NOTE — Progress Notes (Signed)
Remote pacemaker transmission.   

## 2015-02-06 ENCOUNTER — Ambulatory Visit (INDEPENDENT_AMBULATORY_CARE_PROVIDER_SITE_OTHER): Payer: Medicare HMO | Admitting: Pharmacist Clinician (PhC)/ Clinical Pharmacy Specialist

## 2015-02-06 DIAGNOSIS — Z952 Presence of prosthetic heart valve: Secondary | ICD-10-CM

## 2015-02-06 DIAGNOSIS — Z954 Presence of other heart-valve replacement: Secondary | ICD-10-CM

## 2015-02-06 DIAGNOSIS — Z7901 Long term (current) use of anticoagulants: Secondary | ICD-10-CM

## 2015-02-06 LAB — POCT INR: INR: 3.1

## 2015-02-09 LAB — MDC_IDC_ENUM_SESS_TYPE_REMOTE
Battery Impedance: 248 Ohm
Battery Remaining Longevity: 124 mo
Battery Voltage: 2.79 V
Brady Statistic AP VP Percent: 0 %
Brady Statistic AP VS Percent: 69 %
Brady Statistic AS VP Percent: 0 %
Brady Statistic AS VS Percent: 31 %
Date Time Interrogation Session: 20160225235612
Lead Channel Impedance Value: 414 Ohm
Lead Channel Impedance Value: 537 Ohm
Lead Channel Pacing Threshold Amplitude: 0.5 V
Lead Channel Pacing Threshold Amplitude: 0.75 V
Lead Channel Pacing Threshold Pulse Width: 0.4 ms
Lead Channel Pacing Threshold Pulse Width: 0.4 ms
Lead Channel Sensing Intrinsic Amplitude: 2.8 mV
Lead Channel Sensing Intrinsic Amplitude: 5.6 mV
Lead Channel Setting Pacing Amplitude: 2 V
Lead Channel Setting Pacing Amplitude: 2 V
Lead Channel Setting Pacing Pulse Width: 0.4 ms
Lead Channel Setting Sensing Sensitivity: 2 mV

## 2015-02-16 ENCOUNTER — Encounter: Payer: Self-pay | Admitting: Cardiology

## 2015-03-01 ENCOUNTER — Encounter: Payer: Self-pay | Admitting: Cardiovascular Disease

## 2015-03-06 ENCOUNTER — Ambulatory Visit (INDEPENDENT_AMBULATORY_CARE_PROVIDER_SITE_OTHER): Payer: Medicare HMO | Admitting: Pharmacist Clinician (PhC)/ Clinical Pharmacy Specialist

## 2015-03-06 DIAGNOSIS — Z954 Presence of other heart-valve replacement: Secondary | ICD-10-CM

## 2015-03-06 DIAGNOSIS — Z952 Presence of prosthetic heart valve: Secondary | ICD-10-CM

## 2015-03-06 DIAGNOSIS — Z7901 Long term (current) use of anticoagulants: Secondary | ICD-10-CM | POA: Diagnosis not present

## 2015-03-06 LAB — POCT INR: INR: 2.9

## 2015-04-06 ENCOUNTER — Ambulatory Visit (INDEPENDENT_AMBULATORY_CARE_PROVIDER_SITE_OTHER): Payer: Commercial Managed Care - HMO | Admitting: Pharmacist Clinician (PhC)/ Clinical Pharmacy Specialist

## 2015-04-06 DIAGNOSIS — Z7901 Long term (current) use of anticoagulants: Secondary | ICD-10-CM

## 2015-04-06 DIAGNOSIS — Z5181 Encounter for therapeutic drug level monitoring: Secondary | ICD-10-CM

## 2015-04-06 DIAGNOSIS — Z954 Presence of other heart-valve replacement: Secondary | ICD-10-CM | POA: Diagnosis not present

## 2015-04-06 DIAGNOSIS — Z952 Presence of prosthetic heart valve: Secondary | ICD-10-CM

## 2015-04-06 LAB — POCT INR: INR: 4.2

## 2015-04-11 ENCOUNTER — Other Ambulatory Visit: Payer: Self-pay | Admitting: Pharmacist Clinician (PhC)/ Clinical Pharmacy Specialist

## 2015-04-27 ENCOUNTER — Ambulatory Visit (INDEPENDENT_AMBULATORY_CARE_PROVIDER_SITE_OTHER): Payer: Commercial Managed Care - HMO | Admitting: Pharmacist Clinician (PhC)/ Clinical Pharmacy Specialist

## 2015-04-27 DIAGNOSIS — Z7901 Long term (current) use of anticoagulants: Secondary | ICD-10-CM

## 2015-04-27 DIAGNOSIS — Z954 Presence of other heart-valve replacement: Secondary | ICD-10-CM

## 2015-04-27 DIAGNOSIS — Z952 Presence of prosthetic heart valve: Secondary | ICD-10-CM

## 2015-04-27 LAB — POCT INR: INR: 4

## 2015-05-02 ENCOUNTER — Ambulatory Visit (INDEPENDENT_AMBULATORY_CARE_PROVIDER_SITE_OTHER): Payer: Commercial Managed Care - HMO | Admitting: *Deleted

## 2015-05-02 ENCOUNTER — Telehealth: Payer: Self-pay | Admitting: Cardiology

## 2015-05-02 DIAGNOSIS — I442 Atrioventricular block, complete: Secondary | ICD-10-CM | POA: Diagnosis not present

## 2015-05-02 NOTE — Telephone Encounter (Signed)
LMOVM reminding pt to send remote transmission.   

## 2015-05-03 NOTE — Progress Notes (Signed)
Remote pacemaker transmission.   

## 2015-05-11 DIAGNOSIS — Z952 Presence of prosthetic heart valve: Secondary | ICD-10-CM | POA: Diagnosis not present

## 2015-05-11 DIAGNOSIS — I480 Paroxysmal atrial fibrillation: Secondary | ICD-10-CM | POA: Diagnosis not present

## 2015-05-11 DIAGNOSIS — Z1211 Encounter for screening for malignant neoplasm of colon: Secondary | ICD-10-CM | POA: Diagnosis not present

## 2015-05-11 DIAGNOSIS — I10 Essential (primary) hypertension: Secondary | ICD-10-CM | POA: Diagnosis not present

## 2015-05-11 DIAGNOSIS — H269 Unspecified cataract: Secondary | ICD-10-CM | POA: Diagnosis not present

## 2015-05-11 DIAGNOSIS — Z950 Presence of cardiac pacemaker: Secondary | ICD-10-CM | POA: Diagnosis not present

## 2015-05-11 DIAGNOSIS — Z9889 Other specified postprocedural states: Secondary | ICD-10-CM | POA: Diagnosis not present

## 2015-05-12 ENCOUNTER — Ambulatory Visit (INDEPENDENT_AMBULATORY_CARE_PROVIDER_SITE_OTHER): Payer: Commercial Managed Care - HMO | Admitting: Pharmacist Clinician (PhC)/ Clinical Pharmacy Specialist

## 2015-05-12 DIAGNOSIS — Z7901 Long term (current) use of anticoagulants: Secondary | ICD-10-CM | POA: Diagnosis not present

## 2015-05-12 DIAGNOSIS — Z954 Presence of other heart-valve replacement: Secondary | ICD-10-CM

## 2015-05-12 DIAGNOSIS — Z952 Presence of prosthetic heart valve: Secondary | ICD-10-CM

## 2015-05-12 LAB — POCT INR: INR: 3.3

## 2015-05-24 ENCOUNTER — Encounter: Payer: Self-pay | Admitting: Cardiovascular Disease

## 2015-06-02 ENCOUNTER — Ambulatory Visit (INDEPENDENT_AMBULATORY_CARE_PROVIDER_SITE_OTHER): Payer: Commercial Managed Care - HMO | Admitting: Pharmacist Clinician (PhC)/ Clinical Pharmacy Specialist

## 2015-06-02 DIAGNOSIS — Z954 Presence of other heart-valve replacement: Secondary | ICD-10-CM

## 2015-06-02 DIAGNOSIS — Z7901 Long term (current) use of anticoagulants: Secondary | ICD-10-CM | POA: Diagnosis not present

## 2015-06-02 DIAGNOSIS — Z952 Presence of prosthetic heart valve: Secondary | ICD-10-CM

## 2015-06-02 LAB — POCT INR: INR: 3.8

## 2015-06-21 DIAGNOSIS — H2513 Age-related nuclear cataract, bilateral: Secondary | ICD-10-CM | POA: Diagnosis not present

## 2015-06-21 DIAGNOSIS — H34231 Retinal artery branch occlusion, right eye: Secondary | ICD-10-CM | POA: Diagnosis not present

## 2015-06-21 DIAGNOSIS — H10413 Chronic giant papillary conjunctivitis, bilateral: Secondary | ICD-10-CM | POA: Diagnosis not present

## 2015-06-21 DIAGNOSIS — H04123 Dry eye syndrome of bilateral lacrimal glands: Secondary | ICD-10-CM | POA: Diagnosis not present

## 2015-06-23 ENCOUNTER — Ambulatory Visit (INDEPENDENT_AMBULATORY_CARE_PROVIDER_SITE_OTHER): Payer: Commercial Managed Care - HMO | Admitting: Pharmacist Clinician (PhC)/ Clinical Pharmacy Specialist

## 2015-06-23 DIAGNOSIS — Z7901 Long term (current) use of anticoagulants: Secondary | ICD-10-CM | POA: Diagnosis not present

## 2015-06-23 DIAGNOSIS — Z952 Presence of prosthetic heart valve: Secondary | ICD-10-CM

## 2015-06-23 DIAGNOSIS — Z954 Presence of other heart-valve replacement: Secondary | ICD-10-CM | POA: Diagnosis not present

## 2015-06-23 LAB — POCT INR: INR: 2.3

## 2015-06-29 ENCOUNTER — Other Ambulatory Visit: Payer: Self-pay | Admitting: Gastroenterology

## 2015-06-29 DIAGNOSIS — Z1211 Encounter for screening for malignant neoplasm of colon: Secondary | ICD-10-CM | POA: Diagnosis not present

## 2015-06-29 DIAGNOSIS — K219 Gastro-esophageal reflux disease without esophagitis: Secondary | ICD-10-CM | POA: Diagnosis not present

## 2015-06-29 DIAGNOSIS — K5730 Diverticulosis of large intestine without perforation or abscess without bleeding: Secondary | ICD-10-CM | POA: Diagnosis not present

## 2015-07-07 ENCOUNTER — Ambulatory Visit (INDEPENDENT_AMBULATORY_CARE_PROVIDER_SITE_OTHER): Payer: Commercial Managed Care - HMO | Admitting: Pharmacist Clinician (PhC)/ Clinical Pharmacy Specialist

## 2015-07-07 DIAGNOSIS — Z7901 Long term (current) use of anticoagulants: Secondary | ICD-10-CM | POA: Diagnosis not present

## 2015-07-07 DIAGNOSIS — Z954 Presence of other heart-valve replacement: Secondary | ICD-10-CM

## 2015-07-07 DIAGNOSIS — Z952 Presence of prosthetic heart valve: Secondary | ICD-10-CM

## 2015-07-07 LAB — POCT INR: INR: 3.2

## 2015-07-13 DIAGNOSIS — S46812A Strain of other muscles, fascia and tendons at shoulder and upper arm level, left arm, initial encounter: Secondary | ICD-10-CM | POA: Diagnosis not present

## 2015-08-01 ENCOUNTER — Ambulatory Visit (INDEPENDENT_AMBULATORY_CARE_PROVIDER_SITE_OTHER): Payer: Commercial Managed Care - HMO | Admitting: *Deleted

## 2015-08-01 ENCOUNTER — Telehealth: Payer: Self-pay | Admitting: Cardiology

## 2015-08-01 DIAGNOSIS — I442 Atrioventricular block, complete: Secondary | ICD-10-CM

## 2015-08-01 NOTE — Telephone Encounter (Signed)
Spoke with pt and reminded pt of remote transmission that is due today. Pt verbalized understanding.   

## 2015-08-02 ENCOUNTER — Telehealth: Payer: Self-pay | Admitting: Cardiovascular Disease

## 2015-08-02 NOTE — Telephone Encounter (Signed)
LMOM stating that the patient's transmission was received.  [facility] number for call back if she has questions or concerns.

## 2015-08-02 NOTE — Telephone Encounter (Signed)
Did we receive her pacemaker transmission last evening?   Please let patient know.

## 2015-08-02 NOTE — Progress Notes (Signed)
Remote pacemaker transmission.   

## 2015-08-04 ENCOUNTER — Ambulatory Visit (INDEPENDENT_AMBULATORY_CARE_PROVIDER_SITE_OTHER): Payer: Commercial Managed Care - HMO | Admitting: Pharmacist Clinician (PhC)/ Clinical Pharmacy Specialist

## 2015-08-04 DIAGNOSIS — Z954 Presence of other heart-valve replacement: Secondary | ICD-10-CM | POA: Diagnosis not present

## 2015-08-04 DIAGNOSIS — Z7901 Long term (current) use of anticoagulants: Secondary | ICD-10-CM | POA: Diagnosis not present

## 2015-08-04 DIAGNOSIS — Z952 Presence of prosthetic heart valve: Secondary | ICD-10-CM

## 2015-08-04 LAB — POCT INR: INR: 4

## 2015-08-10 LAB — CUP PACEART REMOTE DEVICE CHECK
Battery Impedance: 272 Ohm
Battery Remaining Longevity: 121 mo
Battery Voltage: 2.8 V
Brady Statistic AP VP Percent: 0 %
Brady Statistic AP VS Percent: 68 %
Brady Statistic AS VP Percent: 0 %
Brady Statistic AS VS Percent: 32 %
Date Time Interrogation Session: 20160830233655
Lead Channel Impedance Value: 409 Ohm
Lead Channel Impedance Value: 535 Ohm
Lead Channel Pacing Threshold Amplitude: 0.5 V
Lead Channel Pacing Threshold Amplitude: 0.75 V
Lead Channel Pacing Threshold Pulse Width: 0.4 ms
Lead Channel Pacing Threshold Pulse Width: 0.4 ms
Lead Channel Sensing Intrinsic Amplitude: 1.4 mV
Lead Channel Sensing Intrinsic Amplitude: 5.6 mV
Lead Channel Setting Pacing Amplitude: 2 V
Lead Channel Setting Pacing Amplitude: 2 V
Lead Channel Setting Pacing Pulse Width: 0.4 ms
Lead Channel Setting Sensing Sensitivity: 2 mV

## 2015-08-17 ENCOUNTER — Other Ambulatory Visit: Payer: Self-pay | Admitting: Cardiovascular Disease

## 2015-08-18 ENCOUNTER — Encounter: Payer: Self-pay | Admitting: Cardiology

## 2015-08-23 ENCOUNTER — Other Ambulatory Visit: Payer: Self-pay | Admitting: Cardiovascular Disease

## 2015-08-23 NOTE — Telephone Encounter (Signed)
Rx has been sent to the pharmacy electronically. ° °

## 2015-08-25 ENCOUNTER — Encounter: Payer: Self-pay | Admitting: Cardiovascular Disease

## 2015-08-25 ENCOUNTER — Ambulatory Visit (INDEPENDENT_AMBULATORY_CARE_PROVIDER_SITE_OTHER): Payer: Commercial Managed Care - HMO | Admitting: Pharmacist Clinician (PhC)/ Clinical Pharmacy Specialist

## 2015-08-25 ENCOUNTER — Other Ambulatory Visit: Payer: Self-pay | Admitting: Pharmacist Clinician (PhC)/ Clinical Pharmacy Specialist

## 2015-08-25 DIAGNOSIS — Z954 Presence of other heart-valve replacement: Secondary | ICD-10-CM

## 2015-08-25 DIAGNOSIS — Z952 Presence of prosthetic heart valve: Secondary | ICD-10-CM

## 2015-08-25 DIAGNOSIS — Z7901 Long term (current) use of anticoagulants: Secondary | ICD-10-CM

## 2015-08-25 LAB — POCT INR
INR: 2.7
INR: 2.7

## 2015-08-25 MED ORDER — ENOXAPARIN SODIUM 100 MG/ML ~~LOC~~ SOLN: 100.0000 mg | SUBCUTANEOUS | Status: DC

## 2015-08-25 NOTE — Patient Instructions (Addendum)
Enoxaprin Dosing Schedule  Enoxparin dose: 100 mg syringe   Date  Warfarin Dose (evenings) Enoxaprin Dose  10-5 (wed) 6 1.5 tabs (3.75 mg)   10-6 5 0   10-7 4 0 8 am  10-8 3 0 8 am  10-9 2 0 8 am  10-10 1 0 No enoxaparin  10-11 Procedure  No enoxaparin  10-12 1 2  tabs (5 mg) 8 am  10-13 2 2  tabs (5 mg) 8 am  10-14 3 2  tabs (5 mg) 8 am  10-15 4 2  tabs (5 mg) 8 am  10-16 5 1.5 tabs (3.75 mg) 8 am  10-17 6 Repeat INR

## 2015-09-04 ENCOUNTER — Encounter (HOSPITAL_COMMUNITY): Payer: Self-pay | Admitting: *Deleted

## 2015-09-08 ENCOUNTER — Ambulatory Visit (INDEPENDENT_AMBULATORY_CARE_PROVIDER_SITE_OTHER): Payer: Commercial Managed Care - HMO | Admitting: Pharmacist Clinician (PhC)/ Clinical Pharmacy Specialist

## 2015-09-08 DIAGNOSIS — Z954 Presence of other heart-valve replacement: Secondary | ICD-10-CM

## 2015-09-08 DIAGNOSIS — Z952 Presence of prosthetic heart valve: Secondary | ICD-10-CM

## 2015-09-10 NOTE — Progress Notes (Signed)
Patient in office to learn injection technique.  Currently with lovenox bridge for upcoming colonoscopy next week.  Pt brings daughter to help her with injections.    Reviewed technique, practiced with syringe and cushion before doing injection herself.

## 2015-09-11 ENCOUNTER — Encounter: Payer: Self-pay | Admitting: Pharmacist Clinician (PhC)/ Clinical Pharmacy Specialist

## 2015-09-11 NOTE — Anesthesia Preprocedure Evaluation (Signed)
Anesthesia Evaluation  Patient identified by MRN, date of birth, ID band Patient awake    Reviewed: Allergy & Precautions, NPO status , Patient's Chart, lab work & pertinent test results  Airway Mallampati: II  TM Distance: >3 FB Neck ROM: Full    Dental no notable dental hx.    Pulmonary neg pulmonary ROS, former smoker,    Pulmonary exam normal breath sounds clear to auscultation       Cardiovascular hypertension, Pt. on medications and Pt. on home beta blockers + Peripheral Vascular Disease  Normal cardiovascular exam+ dysrhythmias Atrial Fibrillation + pacemaker  Rhythm:Regular Rate:Normal  AORTIC VALVE REPLACEMENT 03/07/2010     Neuro/Psych negative neurological ROS  negative psych ROS   GI/Hepatic negative GI ROS, Neg liver ROS,   Endo/Other  negative endocrine ROS  Renal/GU negative Renal ROS  negative genitourinary   Musculoskeletal negative musculoskeletal ROS (+)   Abdominal   Peds negative pediatric ROS (+)  Hematology negative hematology ROS (+)   Anesthesia Other Findings   Reproductive/Obstetrics negative OB ROS                             Anesthesia Physical Anesthesia Plan  ASA: III  Anesthesia Plan: MAC   Post-op Pain Management:    Induction: Intravenous  Airway Management Planned: Simple Face Mask  Additional Equipment:   Intra-op Plan:   Post-operative Plan:   Informed Consent: I have reviewed the patients History and Physical, chart, labs and discussed the procedure including the risks, benefits and alternatives for the proposed anesthesia with the patient or authorized representative who has indicated his/her understanding and acceptance.   Dental advisory given  Plan Discussed with: CRNA and Surgeon  Anesthesia Plan Comments:         Anesthesia Quick Evaluation

## 2015-09-12 ENCOUNTER — Ambulatory Visit (HOSPITAL_COMMUNITY): Payer: Commercial Managed Care - HMO | Admitting: Anesthesiology

## 2015-09-12 ENCOUNTER — Encounter (HOSPITAL_COMMUNITY): Payer: Self-pay

## 2015-09-12 ENCOUNTER — Ambulatory Visit (HOSPITAL_COMMUNITY)
Admission: RE | Admit: 2015-09-12 | Discharge: 2015-09-12 | Disposition: A | Discharge status: Home / Self Care | Payer: Commercial Managed Care - HMO | Source: Ambulatory Visit | Attending: Gastroenterology | Admitting: Gastroenterology

## 2015-09-12 ENCOUNTER — Encounter (HOSPITAL_COMMUNITY)
Admission: RE | Disposition: A | Discharge status: Home / Self Care | Payer: Self-pay | Source: Ambulatory Visit | Attending: Gastroenterology

## 2015-09-12 DIAGNOSIS — I2510 Atherosclerotic heart disease of native coronary artery without angina pectoris: Secondary | ICD-10-CM | POA: Diagnosis not present

## 2015-09-12 DIAGNOSIS — Z87891 Personal history of nicotine dependence: Secondary | ICD-10-CM | POA: Diagnosis not present

## 2015-09-12 DIAGNOSIS — E785 Hyperlipidemia, unspecified: Secondary | ICD-10-CM | POA: Insufficient documentation

## 2015-09-12 DIAGNOSIS — Z1211 Encounter for screening for malignant neoplasm of colon: Secondary | ICD-10-CM | POA: Insufficient documentation

## 2015-09-12 DIAGNOSIS — K641 Second degree hemorrhoids: Secondary | ICD-10-CM | POA: Insufficient documentation

## 2015-09-12 DIAGNOSIS — I4891 Unspecified atrial fibrillation: Secondary | ICD-10-CM | POA: Diagnosis not present

## 2015-09-12 DIAGNOSIS — Z952 Presence of prosthetic heart valve: Secondary | ICD-10-CM | POA: Insufficient documentation

## 2015-09-12 DIAGNOSIS — Z7901 Long term (current) use of anticoagulants: Secondary | ICD-10-CM | POA: Insufficient documentation

## 2015-09-12 DIAGNOSIS — Z79899 Other long term (current) drug therapy: Secondary | ICD-10-CM | POA: Insufficient documentation

## 2015-09-12 DIAGNOSIS — I739 Peripheral vascular disease, unspecified: Secondary | ICD-10-CM | POA: Insufficient documentation

## 2015-09-12 DIAGNOSIS — Z7982 Long term (current) use of aspirin: Secondary | ICD-10-CM | POA: Diagnosis not present

## 2015-09-12 DIAGNOSIS — K5730 Diverticulosis of large intestine without perforation or abscess without bleeding: Secondary | ICD-10-CM | POA: Diagnosis not present

## 2015-09-12 DIAGNOSIS — K5790 Diverticulosis of intestine, part unspecified, without perforation or abscess without bleeding: Secondary | ICD-10-CM | POA: Diagnosis not present

## 2015-09-12 DIAGNOSIS — K633 Ulcer of intestine: Secondary | ICD-10-CM | POA: Diagnosis not present

## 2015-09-12 DIAGNOSIS — I10 Essential (primary) hypertension: Secondary | ICD-10-CM | POA: Insufficient documentation

## 2015-09-12 DIAGNOSIS — Z950 Presence of cardiac pacemaker: Secondary | ICD-10-CM | POA: Insufficient documentation

## 2015-09-12 DIAGNOSIS — K648 Other hemorrhoids: Secondary | ICD-10-CM | POA: Diagnosis not present

## 2015-09-12 DIAGNOSIS — I442 Atrioventricular block, complete: Secondary | ICD-10-CM | POA: Diagnosis not present

## 2015-09-12 HISTORY — PX: COLONOSCOPY WITH PROPOFOL: SHX5780

## 2015-09-12 HISTORY — DX: Presence of cardiac pacemaker: Z95.0

## 2015-09-12 SURGERY — COLONOSCOPY WITH PROPOFOL
Anesthesia: Monitor Anesthesia Care

## 2015-09-12 MED ORDER — PROPOFOL 10 MG/ML IV BOLUS
INTRAVENOUS | Status: AC
  Filled 2015-09-12: qty 20

## 2015-09-12 MED ORDER — PROMETHAZINE HCL 25 MG/ML IJ SOLN: 6.2500 mg | INTRAMUSCULAR | Status: DC | PRN

## 2015-09-12 MED ORDER — SODIUM CHLORIDE 0.9 % IV SOLN: INTRAVENOUS | Status: DC

## 2015-09-12 MED ORDER — CIPROFLOXACIN IN D5W 400 MG/200ML IV SOLN
INTRAVENOUS | Status: AC
Start: 2015-09-12 — End: 2015-09-12
  Filled 2015-09-12: qty 200

## 2015-09-12 MED ORDER — PROPOFOL 10 MG/ML IV BOLUS
INTRAVENOUS | Status: DC | PRN
  Administered 2015-09-12: 20 mg via INTRAVENOUS
  Administered 2015-09-12: 10 mg via INTRAVENOUS
  Administered 2015-09-12: 20 mg via INTRAVENOUS
  Administered 2015-09-12 (×2): 10 mg via INTRAVENOUS
  Administered 2015-09-12: 40 mg via INTRAVENOUS
  Administered 2015-09-12: 20 mg via INTRAVENOUS
  Administered 2015-09-12: 10 mg via INTRAVENOUS
  Administered 2015-09-12: 30 mg via INTRAVENOUS

## 2015-09-12 MED ORDER — CIPROFLOXACIN IN D5W 400 MG/200ML IV SOLN
400.0000 mg | Freq: Once | INTRAVENOUS | Status: AC
  Administered 2015-09-12: 400 mg via INTRAVENOUS

## 2015-09-12 MED ORDER — LACTATED RINGERS IV SOLN
INTRAVENOUS | Status: DC
  Administered 2015-09-12: 1000 mL via INTRAVENOUS

## 2015-09-12 SURGICAL SUPPLY — 22 items

## 2015-09-12 NOTE — Transfer of Care (Signed)
Immediate Anesthesia Transfer of Care Note  Patient: [name] [name]  Procedure(s) Performed: Procedure(s): COLONOSCOPY WITH PROPOFOL (N/A)  Patient Location: PACU  Anesthesia Type:MAC  Level of Consciousness: sedated  Airway & Oxygen Therapy: Patient Spontanous Breathing and Patient connected to face mask oxygen  Post-op Assessment: Report given to RN and Post -op Vital signs reviewed and stable  Post vital signs: Reviewed and stable  Last Vitals:  Filed Vitals:   09/12/15 0715  BP: 138/64  Pulse: 71  Temp:   Resp: 14    Complications: No apparent anesthesia complications

## 2015-09-12 NOTE — H&P (Signed)
[name] [name] is an 69 y.o. female.   Chief Complaint: Colorectal cancer screening. HPI: 69 year old white female, here for a screening colonoscopy. See office notes for details. She has a mechanical AV.  Past Medical History  Diagnosis Date  . Atrial fibrillation ([place])   . Hypertension   . S/P AAA repair 03/07/2010  . Aortic stenosis 03/07/2010    replaced mechanical valve Bentall procedure\  . Systemic hypertension   . Dyslipidemia   . Intermittent complete heart block ([place])     H/O  . Coronary artery disease     Dr. Avon Gully  . Presence of permanent cardiac pacemaker    Past Surgical History  Procedure Laterality Date  . Pacemaker insertion  03/13/2010    Medtronic Adapta  . Abdominal aortic aneurysm repair  03/07/2010  . Aortic valve replacement  03/07/2010    mechanical valve Bentall procedure  . US echocardiography  08/09/2011    mod. concentric LVH,LA mildly dilated,ca+ MV,trace TR and AI,mechanical AOV  . Cardiac catheterization  08/25/2009    severe AS, mild CAD    Family History  Problem Relation Age of Onset  . Diabetes Mother   . Hypertension Mother   . Heart failure Brother   . Hypertension Brother   . Diabetes Brother    Social History:  reports that she has quit smoking. She does not have any smokeless tobacco history on file. She reports that she does not drink alcohol or use illicit drugs.  Allergies:  Allergies  Allergen Reactions  . Crestor [Rosuvastatin Calcium]     Stomach cramps  . Penicillins Other (See Comments)    Has patient had a PCN reaction causing immediate rash, facial/tongue/throat swelling, SOB or lightheadedness with hypotension: unknown Has patient had a PCN reaction causing severe rash involving mucus membranes or skin necrosis: unknown Has patient had a PCN reaction that required hospitalization already in the hospital when it happened Has patient had a PCN reaction occurring within the last 10 years: No If all of the above answers are "NO",  then may proceed with Cephalosporin use.   . Latex Hives and Rash   Medications Prior to Admission  Medication Sig Dispense Refill  . amoxicillin (AMOXIL) 500 MG tablet Take 4 tablets one hour prior to procedure. (Patient taking differently: Take 4 tablets one hour prior to procedure for dental work) 4 tablet 2  . aspirin 81 MG chewable tablet Chew 81 mg by mouth daily.    Marland Kitchen atorvastatin (LIPITOR) 40 MG tablet TAKE 1 TABLET EVERY DAY 90 tablet 0  . Calcium Carbonate (CALCARB 600 PO) Take 1 tablet by mouth daily.    Marland Kitchen enoxaparin (LOVENOX) 100 MG/ML injection Inject 1 mL (100 mg total) into the skin daily. 8 Syringe 0  . hydrochlorothiazide (HYDRODIURIL) 25 MG tablet TAKE 1 TABLET EVERY OTHER DAY 45 tablet 0  . lisinopril (PRINIVIL,ZESTRIL) 20 MG tablet TAKE 1 TABLET EVERY MORNING 90 tablet 0  . metoprolol (LOPRESSOR) 50 MG tablet TAKE 1 TABLET TWICE DAILY 180 tablet 0  . Multiple Vitamin (MULITIVITAMIN WITH MINERALS) TABS Take 1 tablet by mouth daily.    Marland Kitchen warfarin (COUMADIN) 2.5 MG tablet TAKE 1 AND 1/2 TABLETS EVERY DAY OR AS DIRECTED (Patient taking differently: On Monday take 2.5mg . Tues to Sun take 3.75 mg) 135 tablet 2   Review of Systems  Constitutional: Negative.   HENT: Negative.   Eyes: Negative.   Respiratory: Negative.   Cardiovascular: Negative.   Gastrointestinal: Positive for heartburn.  Musculoskeletal: Positive for joint pain.  Skin: Negative.   Neurological: Negative.   Endo/Heme/Allergies: Negative.   Psychiatric/Behavioral: Negative.    There were no vitals taken for this visit. Physical Exam  Constitutional: She is oriented to person, place, and time. She appears well-developed and well-nourished.  HENT:  Head: Normocephalic and atraumatic.  Eyes: Conjunctivae and EOM are normal. Pupils are equal, round, and reactive to light.  Neck: Normal range of motion. Neck supple.  Cardiovascular: Normal rate and regular rhythm.   Respiratory: Effort normal and breath  sounds normal.  Pacemaker on left chest  GI: Soft. Bowel sounds are normal.  Musculoskeletal: Normal range of motion.  Neurological: She is alert and oriented to person, place, and time.  Skin: Skin is warm.  Psychiatric: She has a normal mood and affect. Her behavior is normal. Judgment and thought content normal.    Assessment/Plan Colorectal cancer screening: proceed with a colonoscopy at this time. She has held her Warfarin 5 days and has taken her last dose of Lovenox 2 days ago. She will receive antibiotics before the colonoscopy for prophylaxis    Yitzhak Awan 09/12/2015, 7:07 AM

## 2015-09-12 NOTE — Discharge Instructions (Signed)

## 2015-09-12 NOTE — Anesthesia Postprocedure Evaluation (Signed)
  Anesthesia Post-op Note  Patient: [name] [name]  Procedure(s) Performed: Procedure(s) (LRB): COLONOSCOPY WITH PROPOFOL (N/A)  Patient Location: PACU  Anesthesia Type: MAC  Level of Consciousness: awake and alert   Airway and Oxygen Therapy: Patient Spontanous Breathing  Post-op Pain: mild  Post-op Assessment: Post-op Vital signs reviewed, Patient's Cardiovascular Status Stable, Respiratory Function Stable, Patent Airway and No signs of Nausea or vomiting  Last Vitals:  Filed Vitals:   09/12/15 0754  BP: 80/34  Pulse: 60  Temp: 36.6 C  Resp: 13    Post-op Vital Signs: stable   Complications: No apparent anesthesia complications

## 2015-09-12 NOTE — Op Note (Signed)
[facility] [place], [address]   OPERATIVE PROCEDURE REPORT  PATIENT: [name] [name], [name] [name]  MR#: [id number] BIRTHDATE: [birth year] GENDER: female ENDOSCOPIST: Edmonia James, MD ASSISTANT:   Cherylynn Ridges, technician [address], RN.  PROCEDURE DATE: 2015/09/17 PRE-PROCEDURE PREPARATION: Patient fasted for 4 hours prior to procedure. The patient was prepped with a gallon of Golytely the night prior to the procedure. PRE-PROCEDURE PHYSICAL: Patient has stable vital signs.  Neck is supple.  There is no JVD, thyromegaly or LAD.  Chest clear to auscultation.  S1 and S2 regular.  Pacemaker present on the left side of the chest. Abdomen soft, non-distended, non-tender with NABS. PROCEDURE:     Screening colonoscopy. ASA CLASS:     Class III INDICATIONS:     1.  Colorectal cancer screening. MEDICATIONS:     Monitored anesthesia care  DESCRIPTION OF PROCEDURE: After the risks, benefits, and alternatives of the procedure were thoroughly explained [including a 10% missed rate of cancer and polyps], informed consent was obtained. Digital rectal exam was performed. The Pentax video colonoscope [id number]  was introduced through the anus  and advanced to the cecum, which was identified by both the appendix and ileocecal valve , limited by No adverse events experienced. The quality of the prep was good. Multiple washes were done. Small lesions could be missed. The instrument was then slowly withdrawn as the colon was fully examined. Estimated blood loss is zero unless otherwise noted in this procedure report.     COLON FINDINGS: There was moderate diverticulosis noted in the sigmoid colon. The rest of the colonic mucosa appeared healthy with a normal vascular pattern. No masses, polyps or AVMs were noted. The appendiceal orifice and the ICV were identified and photographed. Retroflexed views revealed moderate sized internal hemorrhoids.  The patient  tolerated the procedure without immediate complications.  The scope was then withdrawn from the patient and the procedure terminated.  TIME TO CECUM:  5 minutes 00 seconds WITHDRAW TIME:  6 minutes 00 seconds  IMPRESSION:     There was moderate diverticulosis noted in the sigmoid colon and prominent Grade II internal hemorrhoids.  RECOMMENDATIONS:     1.  Continue current medications. 2.  Continue surveillance. 3.  High fiber diet with liberal fluid intake. 4.  OP follow-up is advised on a PRN basis.  REPEAT EXAM:      A repeat colonoscopy is not palnned for the patient considering ehr age and co-morbidities. A cologaurd stool test will be done in 10 years  If the patient has any abnormal GI symptoms in the interim, she/he have been advised to contact the office as soon as possible for further recommendations.   REFERRED BY: Dr. Lucienne Capers. Shamleffer eSigned:  Edmonia James, MD 09-17-2015 8:04 AM  CPT CODES:     [phone] Colonoscopy, flexible, proximal to splenic flexure; diagnostic, with or without collection of specimen(s) by brushing or washing, with or without colon decompression (separate procedure) ICD CODES:     K57.30 Diverticulosis Z12.11 Encounter for screening for malignant neoplasm of colon K64.1 Second degree hemorrhoids   The ICD and CPT codes recommended by this software are interpretations from the data that the clinical staff has captured with the software.  The verification of the translation of this report to the ICD and CPT codes and modifiers is the sole responsibility of the health care institution and practicing physician where this report was generated.  [employer] [place]. will not be held  responsible for the validity of the ICD and CPT codes included on this report.  AMA assumes no liability for data contained or not contained herein. CPT is a Designer, television/film set of the [employer].  PATIENT NAME:  [name] [name], [name] [name] MR#:  [id number]

## 2015-09-13 ENCOUNTER — Encounter (HOSPITAL_COMMUNITY): Payer: Self-pay | Admitting: Gastroenterology

## 2015-09-18 ENCOUNTER — Ambulatory Visit (INDEPENDENT_AMBULATORY_CARE_PROVIDER_SITE_OTHER): Payer: Commercial Managed Care - HMO | Admitting: Pharmacist Clinician (PhC)/ Clinical Pharmacy Specialist

## 2015-09-18 DIAGNOSIS — Z954 Presence of other heart-valve replacement: Secondary | ICD-10-CM

## 2015-09-18 DIAGNOSIS — Z7901 Long term (current) use of anticoagulants: Secondary | ICD-10-CM

## 2015-09-18 DIAGNOSIS — Z952 Presence of prosthetic heart valve: Secondary | ICD-10-CM

## 2015-09-18 LAB — POCT INR: INR: 2

## 2015-09-28 ENCOUNTER — Ambulatory Visit (INDEPENDENT_AMBULATORY_CARE_PROVIDER_SITE_OTHER): Payer: Commercial Managed Care - HMO | Admitting: Pharmacist Clinician (PhC)/ Clinical Pharmacy Specialist

## 2015-09-28 DIAGNOSIS — Z954 Presence of other heart-valve replacement: Secondary | ICD-10-CM

## 2015-09-28 DIAGNOSIS — Z7901 Long term (current) use of anticoagulants: Secondary | ICD-10-CM | POA: Diagnosis not present

## 2015-09-28 DIAGNOSIS — Z952 Presence of prosthetic heart valve: Secondary | ICD-10-CM

## 2015-09-28 LAB — POCT INR: INR: 3.1

## 2015-10-11 DIAGNOSIS — Z952 Presence of prosthetic heart valve: Secondary | ICD-10-CM | POA: Diagnosis not present

## 2015-10-11 DIAGNOSIS — Z780 Asymptomatic menopausal state: Secondary | ICD-10-CM | POA: Diagnosis not present

## 2015-10-11 DIAGNOSIS — Z950 Presence of cardiac pacemaker: Secondary | ICD-10-CM | POA: Diagnosis not present

## 2015-10-11 DIAGNOSIS — Z0000 Encounter for general adult medical examination without abnormal findings: Secondary | ICD-10-CM | POA: Diagnosis not present

## 2015-10-11 DIAGNOSIS — R636 Underweight: Secondary | ICD-10-CM | POA: Diagnosis not present

## 2015-10-11 DIAGNOSIS — Z1389 Encounter for screening for other disorder: Secondary | ICD-10-CM | POA: Diagnosis not present

## 2015-10-11 DIAGNOSIS — Z23 Encounter for immunization: Secondary | ICD-10-CM | POA: Diagnosis not present

## 2015-10-11 DIAGNOSIS — I480 Paroxysmal atrial fibrillation: Secondary | ICD-10-CM | POA: Diagnosis not present

## 2015-10-13 ENCOUNTER — Telehealth: Payer: Self-pay | Admitting: Cardiovascular Disease

## 2015-10-13 DIAGNOSIS — E785 Hyperlipidemia, unspecified: Secondary | ICD-10-CM

## 2015-10-13 DIAGNOSIS — Z79899 Other long term (current) drug therapy: Secondary | ICD-10-CM

## 2015-10-13 NOTE — Telephone Encounter (Signed)
Pt called in stating that she wanted to know if Dr.C wanted her to have labs done prior to coming in to her appt on 11/17. Please f/u with her  Thanks

## 2015-10-13 NOTE — Telephone Encounter (Signed)
Lipid and CMET ordered.  Patient aware she should have labs done 11/14 or 11/15 if she wants to have the results in by the time of her office visit.

## 2015-10-17 DIAGNOSIS — E785 Hyperlipidemia, unspecified: Secondary | ICD-10-CM | POA: Diagnosis not present

## 2015-10-17 DIAGNOSIS — Z79899 Other long term (current) drug therapy: Secondary | ICD-10-CM | POA: Diagnosis not present

## 2015-10-18 LAB — COMPREHENSIVE METABOLIC PANEL
ALT: 14 U/L (ref 6–29)
AST: 22 U/L (ref 10–35)
Albumin: 3.8 g/dL (ref 3.6–5.1)
Alkaline Phosphatase: 47 U/L (ref 33–130)
BUN: 16 mg/dL (ref 7–25)
CO2: 29 mmol/L (ref 20–31)
Calcium: 9.7 mg/dL (ref 8.6–10.4)
Chloride: 104 mmol/L (ref 98–110)
Creat: 0.86 mg/dL (ref 0.60–0.93)
Glucose, Bld: 89 mg/dL (ref 65–99)
Potassium: 4.5 mmol/L (ref 3.5–5.3)
Sodium: 140 mmol/L (ref 135–146)
Total Bilirubin: 0.6 mg/dL (ref 0.2–1.2)
Total Protein: 6.5 g/dL (ref 6.1–8.1)

## 2015-10-18 LAB — LIPID PANEL
Cholesterol: 156 mg/dL (ref 125–200)
HDL: 68 mg/dL (ref >=46)
LDL Cholesterol: 76 mg/dL (ref <130)
Total CHOL/HDL Ratio: 2.3 Ratio (ref <=5.0)
Triglycerides: 62 mg/dL (ref <150)
VLDL: 12 mg/dL (ref <30)

## 2015-10-19 ENCOUNTER — Encounter: Payer: Self-pay | Admitting: Cardiovascular Disease

## 2015-10-19 ENCOUNTER — Ambulatory Visit (INDEPENDENT_AMBULATORY_CARE_PROVIDER_SITE_OTHER): Payer: Commercial Managed Care - HMO | Admitting: Cardiovascular Disease

## 2015-10-19 ENCOUNTER — Ambulatory Visit (INDEPENDENT_AMBULATORY_CARE_PROVIDER_SITE_OTHER): Payer: Commercial Managed Care - HMO | Admitting: Pharmacist Clinician (PhC)/ Clinical Pharmacy Specialist

## 2015-10-19 VITALS — BP 137/75 | HR 76 | Ht 66.0 in | Wt 131.0 lb

## 2015-10-19 DIAGNOSIS — I442 Atrioventricular block, complete: Secondary | ICD-10-CM

## 2015-10-19 DIAGNOSIS — I712 Thoracic aortic aneurysm, without rupture: Secondary | ICD-10-CM | POA: Diagnosis not present

## 2015-10-19 DIAGNOSIS — I471 Supraventricular tachycardia: Secondary | ICD-10-CM

## 2015-10-19 DIAGNOSIS — Z7901 Long term (current) use of anticoagulants: Secondary | ICD-10-CM

## 2015-10-19 DIAGNOSIS — I4719 Other supraventricular tachycardia: Secondary | ICD-10-CM

## 2015-10-19 DIAGNOSIS — Z950 Presence of cardiac pacemaker: Secondary | ICD-10-CM

## 2015-10-19 DIAGNOSIS — Z952 Presence of prosthetic heart valve: Secondary | ICD-10-CM

## 2015-10-19 DIAGNOSIS — Z954 Presence of other heart-valve replacement: Secondary | ICD-10-CM

## 2015-10-19 DIAGNOSIS — R0789 Other chest pain: Secondary | ICD-10-CM | POA: Diagnosis not present

## 2015-10-19 DIAGNOSIS — I7121 Aneurysm of the ascending aorta, without rupture: Secondary | ICD-10-CM

## 2015-10-19 DIAGNOSIS — E785 Hyperlipidemia, unspecified: Secondary | ICD-10-CM

## 2015-10-19 LAB — POCT INR: INR: 2.7

## 2015-10-19 NOTE — Progress Notes (Signed)
Patient ID: [name] [name], female   DOB: 1946/09/16, 69 y.o.   MRN: [id number]      Cardiology Office Note   Date:  10/19/2015   ID:  [name] [name], DOB [birth year], MRN [id number]  PCP:  Lottie Dawson, MD  Cardiologist:   Sanda Klein, MD   Chief Complaint  Patient presents with  . Annual Exam    patient reports occasional chest discomfort,       History of Present Illness: [name] [name] is a 69 y.o. female who presents for Followup status post aortic valve replacement , aortic aneurysm repair, pacemaker, atrial tachycardia  It's been 4-1/2 years since [name] [name] underwent a Bentall repair for severe aortic stenosis and ascending aortic aneurysm associated with a bicuspid aortic valve. She feels well and denies problems with shortness of breath. She has occasional positional dizziness. She denies chest pain, syncope or palpitations or lower extremity edema. She's not had any bleeding complications and has not had any symptoms of stroke/TIA. She has normal coronary arteries by angiography. Her pacemaker check shows occasional ectopic atrial tachycardia. Of note her pacemaker was implanted for what appeared to be permanent heart block but this resolved late after surgery.  Pacemaker check today shows normal function. She has roughly 69% atrial pacing and never requires ventricular pacing. Heart rate histogram shows normal heart rate distribution. She has only had 2 episodes of mode switch, less than 1 minute in duration. Lead parameters are excellent and generator longevity is estimated at around 10 years.     Past Medical History  Diagnosis Date  . Atrial fibrillation ([place])   . Hypertension   . S/P AAA repair 03/07/2010  . Aortic stenosis 03/07/2010    replaced mechanical valve Bentall procedure\  . Systemic hypertension   . Dyslipidemia   . Intermittent complete heart block ([place])     H/O  . Coronary artery disease     Dr. Avon Gully  . Presence of permanent cardiac pacemaker       Past Surgical History  Procedure Laterality Date  . Pacemaker insertion  03/13/2010    Medtronic Adapta  . Abdominal aortic aneurysm repair  03/07/2010  . Aortic valve replacement  03/07/2010    mechanical valve Bentall procedure  . US echocardiography  08/09/2011    mod. concentric LVH,LA mildly dilated,ca+ MV,trace TR and AI,mechanical AOV  . Cardiac catheterization  08/25/2009    severe AS, mild CAD  . Colonoscopy with propofol N/A 09/12/2015    Procedure: COLONOSCOPY WITH PROPOFOL;  Surgeon: Juanita Craver, MD;  Location: [facility] END[facility];  Service: Endoscopy;  Laterality: N/A;     Current Outpatient Prescriptions  Medication Sig Dispense Refill  . aspirin 81 MG chewable tablet Chew 81 mg by mouth daily.    Marland Kitchen atorvastatin (LIPITOR) 40 MG tablet TAKE 1 TABLET EVERY DAY 90 tablet 0  . Calcium Carbonate (CALCARB 600 PO) Take 1 tablet by mouth daily.    . clindamycin (CLEOCIN) 150 MG capsule Take 3 capsules by mouth as directed. For dental procedure/cleanings    . hydrochlorothiazide (HYDRODIURIL) 25 MG tablet TAKE 1 TABLET EVERY OTHER DAY 45 tablet 0  . lisinopril (PRINIVIL,ZESTRIL) 20 MG tablet TAKE 1 TABLET EVERY MORNING 90 tablet 0  . metoprolol (LOPRESSOR) 50 MG tablet TAKE 1 TABLET TWICE DAILY 180 tablet 0  . Multiple Vitamin (MULITIVITAMIN WITH MINERALS) TABS Take 1 tablet by mouth daily.    Marland Kitchen warfarin (COUMADIN) 2.5 MG tablet TAKE 1 AND 1/2 TABLETS EVERY DAY OR AS DIRECTED (Patient taking  differently: On Monday take 2.5mg . Tues to [name] take 3.75 mg) 135 tablet 2   No current facility-administered medications for this visit.    Allergies:   Crestor; Penicillins; and Latex    Social History:  The patient  reports that she has quit smoking. She does not have any smokeless tobacco history on file. She reports that she does not drink alcohol or use illicit drugs.   Family History:  The patient's family history includes Diabetes in her brother and mother; Heart failure in her brother;  Hypertension in her brother and mother.    ROS:  Please see the history of present illness.    Otherwise, review of systems positive for none.   All other systems are reviewed and negative.    PHYSICAL EXAM: VS:  BP 137/75 mmHg  Pulse 76  Ht 5\' 6"  (1.676 m)  Wt 131 lb (59.421 kg)  BMI 21.15 kg/m2 , BMI Body mass index is 21.15 kg/(m^2).  General: Alert, oriented x3, no distress Head: no evidence of trauma, PERRL, EOMI, no exophtalmos or lid lag, no myxedema, no xanthelasma; normal ears, nose and oropharynx Neck: normal jugular venous pulsations and no hepatojugular reflux; brisk carotid pulses without delay and no carotid bruits Chest: clear to auscultation, no signs of consolidation by percussion or palpation, normal fremitus, symmetrical and full respiratory excursions; left subclavian pacemaker scar and sternotomy scar Cardiovascular: normal position and quality of the apical impulse, regular rhythm, normal first and widely split second heart sounds with crisp prosthetic valve clicks, no murmurs, rubs or gallops Abdomen: no tenderness or distention, no masses by palpation, no abnormal pulsatility or arterial bruits, normal bowel sounds, no hepatosplenomegaly Extremities: no clubbing, cyanosis or edema; 2+ radial, ulnar and brachial pulses bilaterally; 2+ right femoral, posterior tibial and dorsalis pedis pulses; 2+ left femoral, posterior tibial and dorsalis pedis pulses; no subclavian or femoral bruits Neurological: grossly nonfocal Psych: euthymic mood, full affect   EKG:  EKG is ordered today. 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 As before she has a very long AV delay   Recent Labs: 10/21/2014: Hemoglobin 11.3*; Platelets 202; TSH 1.459 10/17/2015: ALT 14; BUN 16; Creat 0.86; Potassium 4.5; Sodium 140    Lipid Panel    Component Value Date/Time    CHOL 156 10/17/2015 1115   TRIG 62 10/17/2015 1115   HDL 68 10/17/2015 1115   CHOLHDL 2.3 10/17/2015 1115   VLDL 12 10/17/2015 1115   LDLCALC 76 10/17/2015 1115      Wt Readings from Last 3 Encounters:  10/19/15 131 lb (59.421 kg)  09/12/15 143 lb (64.864 kg)  10/25/14 143 lb 8 oz (65.091 kg)      ASSESSMENT AND PLAN:  H/O aortic valve replacement, 23 mm Carbomedics No problems with anticoagulation, reminded her of need for endocarditis prevention. Normal valve function by echocardiogram 07/2013.  Ascending aortic aneurysm s/p Bentall repair 2011  "Complete heart block" Now, virtually no ventricular pacing is noted with MVP on. 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  Pacemaker, dual chamber Medtronic Adapta April 2011 Normal device function. She is enrolled in the [facility] remote device monitoring system. Occasional episodes of atrial tachycardia are recorded. No convincing evidence of atrial fibrillation. Normal battery and lead parameters.  Paroxysmal atrial tachycardia Very little atrial tachycardia recently.  Hyperlipidemia She does not have CAD. Excellent lipid profile.    Current medicines are reviewed at length with the patient today.  The patient does not have concerns regarding medicines.  The following changes have been made:  no change  Labs/ tests ordered today include:  Orders Placed This Encounter  Procedures  . EKG 12-Lead    Patient Instructions  Remote monitoring is used to monitor your Pacemaker of ICD from home. This monitoring reduces the number of office visits required to check your device to one time per year. It allows [place] to keep an eye on the functioning of your device to ensure it is working properly. You are scheduled for a device check from home on February 18th, 2017. You may send your transmission at any time that day. If you  have a wireless device, the transmission will be sent automatically. After your physician reviews your transmission, you will receive a postcard with your next transmission date.  Dr Sallyanne Kuster recommends that you schedule a follow-up appointment in 12 months with a device check. You will receive a reminder letter in the mail two months in advance. If you don't receive a letter, please call our office to schedule the follow-up appointment.  If you need a refill on your cardiac medications before your next appointment, please call your pharmacy.     Mikael Spray, MD  10/19/2015 9:08 PM    Sanda Klein, MD, [facility] [phone] office [phone] pager

## 2015-10-19 NOTE — Patient Instructions (Signed)
Remote monitoring is used to monitor your Pacemaker of ICD from home. This monitoring reduces the number of office visits required to check your device to one time per year. It allows [place] to keep an eye on the functioning of your device to ensure it is working properly. You are scheduled for a device check from home on February 18th, 2017. You may send your transmission at any time that day. If you have a wireless device, the transmission will be sent automatically. After your physician reviews your transmission, you will receive a postcard with your next transmission date.  Dr Sallyanne Kuster recommends that you schedule a follow-up appointment in 12 months with a device check. You will receive a reminder letter in the mail two months in advance. If you don't receive a letter, please call our office to schedule the follow-up appointment.  If you need a refill on your cardiac medications before your next appointment, please call your pharmacy.

## 2015-10-25 LAB — CUP PACEART INCLINIC DEVICE CHECK
Battery Impedance: 295 Ohm
Battery Remaining Longevity: 119 mo
Battery Voltage: 2.79 V
Brady Statistic AP VP Percent: 0 %
Brady Statistic AP VS Percent: 69 %
Brady Statistic AS VP Percent: 0 %
Brady Statistic AS VS Percent: 31 %
Date Time Interrogation Session: 20161117153139
Implantable Lead Implant Date: 20110412
Implantable Lead Implant Date: 20110412
Implantable Lead Location: 753859
Implantable Lead Location: 753860
Implantable Lead Model: 5076
Implantable Lead Model: 5076
Lead Channel Impedance Value: 420 Ohm
Lead Channel Impedance Value: 558 Ohm
Lead Channel Pacing Threshold Amplitude: 0.375 V
Lead Channel Pacing Threshold Amplitude: 0.75 V
Lead Channel Pacing Threshold Pulse Width: 0.4 ms
Lead Channel Pacing Threshold Pulse Width: 0.4 ms
Lead Channel Sensing Intrinsic Amplitude: 1.4 mV
Lead Channel Sensing Intrinsic Amplitude: 5.6 mV
Lead Channel Setting Pacing Amplitude: 2 V
Lead Channel Setting Pacing Amplitude: 2 V
Lead Channel Setting Pacing Pulse Width: 0.4 ms
Lead Channel Setting Sensing Sensitivity: 2 mV

## 2015-10-27 ENCOUNTER — Other Ambulatory Visit: Payer: Self-pay | Admitting: Cardiovascular Disease

## 2015-10-30 MED ORDER — WARFARIN SODIUM 2.5 MG PO TABS: ORAL_TABLET | ORAL | Status: DC

## 2015-10-30 NOTE — Telephone Encounter (Signed)
Coumadin deferred to Erasmo Downer, PharmD for authorization.

## 2015-10-30 NOTE — Addendum Note (Signed)
Addended by: Rockne Menghini on: 10/30/2015 03:50 PM   Modules accepted: Orders

## 2015-11-03 ENCOUNTER — Encounter: Payer: Self-pay | Admitting: Cardiovascular Disease

## 2015-11-14 DIAGNOSIS — Z780 Asymptomatic menopausal state: Secondary | ICD-10-CM | POA: Diagnosis not present

## 2015-12-01 ENCOUNTER — Ambulatory Visit (INDEPENDENT_AMBULATORY_CARE_PROVIDER_SITE_OTHER): Payer: Commercial Managed Care - HMO | Admitting: Pharmacist Clinician (PhC)/ Clinical Pharmacy Specialist

## 2015-12-01 DIAGNOSIS — Z952 Presence of prosthetic heart valve: Secondary | ICD-10-CM

## 2015-12-01 DIAGNOSIS — Z954 Presence of other heart-valve replacement: Secondary | ICD-10-CM | POA: Diagnosis not present

## 2015-12-01 DIAGNOSIS — Z7901 Long term (current) use of anticoagulants: Secondary | ICD-10-CM

## 2015-12-01 LAB — POCT INR: INR: 4.3

## 2015-12-22 ENCOUNTER — Ambulatory Visit (INDEPENDENT_AMBULATORY_CARE_PROVIDER_SITE_OTHER): Payer: Commercial Managed Care - HMO | Admitting: Pharmacist Clinician (PhC)/ Clinical Pharmacy Specialist

## 2015-12-22 DIAGNOSIS — Z7901 Long term (current) use of anticoagulants: Secondary | ICD-10-CM | POA: Diagnosis not present

## 2015-12-22 DIAGNOSIS — Z954 Presence of other heart-valve replacement: Secondary | ICD-10-CM

## 2015-12-22 DIAGNOSIS — Z952 Presence of prosthetic heart valve: Secondary | ICD-10-CM

## 2015-12-22 LAB — POCT INR: INR: 3.4

## 2016-01-08 ENCOUNTER — Other Ambulatory Visit: Payer: Self-pay

## 2016-01-08 DIAGNOSIS — Z1231 Encounter for screening mammogram for malignant neoplasm of breast: Secondary | ICD-10-CM

## 2016-01-19 ENCOUNTER — Ambulatory Visit (INDEPENDENT_AMBULATORY_CARE_PROVIDER_SITE_OTHER): Payer: Commercial Managed Care - HMO | Admitting: Pharmacist Clinician (PhC)/ Clinical Pharmacy Specialist

## 2016-01-19 DIAGNOSIS — Z7901 Long term (current) use of anticoagulants: Secondary | ICD-10-CM | POA: Diagnosis not present

## 2016-01-19 DIAGNOSIS — Z954 Presence of other heart-valve replacement: Secondary | ICD-10-CM

## 2016-01-19 DIAGNOSIS — Z952 Presence of prosthetic heart valve: Secondary | ICD-10-CM

## 2016-01-19 LAB — POCT INR: INR: 2.7

## 2016-01-22 ENCOUNTER — Ambulatory Visit (INDEPENDENT_AMBULATORY_CARE_PROVIDER_SITE_OTHER): Payer: Commercial Managed Care - HMO | Admitting: *Deleted

## 2016-01-22 ENCOUNTER — Telehealth: Payer: Self-pay | Admitting: Cardiology

## 2016-01-22 DIAGNOSIS — I442 Atrioventricular block, complete: Secondary | ICD-10-CM

## 2016-01-22 NOTE — Telephone Encounter (Signed)
Spoke with pt and reminded pt of remote transmission that is due today. Pt verbalized understanding.   

## 2016-01-24 NOTE — Progress Notes (Signed)
Remote pacemaker transmission.   

## 2016-02-02 ENCOUNTER — Ambulatory Visit
Admission: RE | Admit: 2016-02-02 | Discharge: 2016-02-02 | Disposition: A | Discharge status: Home / Self Care | Payer: Commercial Managed Care - HMO | Source: Ambulatory Visit

## 2016-02-02 DIAGNOSIS — Z1231 Encounter for screening mammogram for malignant neoplasm of breast: Secondary | ICD-10-CM

## 2016-02-04 ENCOUNTER — Encounter: Payer: Self-pay | Admitting: Cardiology

## 2016-02-04 LAB — CUP PACEART REMOTE DEVICE CHECK
Battery Impedance: 319 Ohm
Battery Remaining Longevity: 116 mo
Battery Voltage: 2.79 V
Brady Statistic AP VP Percent: 0 %
Brady Statistic AP VS Percent: 68 %
Brady Statistic AS VP Percent: 0 %
Brady Statistic AS VS Percent: 32 %
Date Time Interrogation Session: 20170220230551
Implantable Lead Implant Date: 20110412
Implantable Lead Implant Date: 20110412
Implantable Lead Location: 753859
Implantable Lead Location: 753860
Implantable Lead Model: 5076
Implantable Lead Model: 5076
Lead Channel Impedance Value: 414 Ohm
Lead Channel Impedance Value: 558 Ohm
Lead Channel Setting Pacing Amplitude: 2 V
Lead Channel Setting Pacing Amplitude: 2 V
Lead Channel Setting Pacing Pulse Width: 0.4 ms
Lead Channel Setting Sensing Sensitivity: 2 mV

## 2016-02-04 NOTE — Progress Notes (Signed)
Normal remote reviewed.  Next Carelink 04/22/16 

## 2016-02-16 ENCOUNTER — Ambulatory Visit (INDEPENDENT_AMBULATORY_CARE_PROVIDER_SITE_OTHER): Payer: Commercial Managed Care - HMO | Admitting: Pharmacist Clinician (PhC)/ Clinical Pharmacy Specialist

## 2016-02-16 DIAGNOSIS — Z954 Presence of other heart-valve replacement: Secondary | ICD-10-CM | POA: Diagnosis not present

## 2016-02-16 DIAGNOSIS — Z7901 Long term (current) use of anticoagulants: Secondary | ICD-10-CM | POA: Diagnosis not present

## 2016-02-16 DIAGNOSIS — Z952 Presence of prosthetic heart valve: Secondary | ICD-10-CM

## 2016-02-16 LAB — POCT INR: INR: 4.8

## 2016-02-29 DIAGNOSIS — H04123 Dry eye syndrome of bilateral lacrimal glands: Secondary | ICD-10-CM | POA: Diagnosis not present

## 2016-02-29 DIAGNOSIS — H2513 Age-related nuclear cataract, bilateral: Secondary | ICD-10-CM | POA: Diagnosis not present

## 2016-02-29 DIAGNOSIS — T1511XA Foreign body in conjunctival sac, right eye, initial encounter: Secondary | ICD-10-CM | POA: Diagnosis not present

## 2016-02-29 DIAGNOSIS — H10413 Chronic giant papillary conjunctivitis, bilateral: Secondary | ICD-10-CM | POA: Diagnosis not present

## 2016-03-01 ENCOUNTER — Ambulatory Visit (INDEPENDENT_AMBULATORY_CARE_PROVIDER_SITE_OTHER): Payer: Commercial Managed Care - HMO | Admitting: Pharmacist Clinician (PhC)/ Clinical Pharmacy Specialist

## 2016-03-01 DIAGNOSIS — Z7901 Long term (current) use of anticoagulants: Secondary | ICD-10-CM | POA: Diagnosis not present

## 2016-03-01 DIAGNOSIS — Z954 Presence of other heart-valve replacement: Secondary | ICD-10-CM | POA: Diagnosis not present

## 2016-03-01 DIAGNOSIS — Z952 Presence of prosthetic heart valve: Secondary | ICD-10-CM

## 2016-03-01 LAB — POCT INR: INR: 3

## 2016-03-29 ENCOUNTER — Ambulatory Visit (INDEPENDENT_AMBULATORY_CARE_PROVIDER_SITE_OTHER): Payer: Commercial Managed Care - HMO | Admitting: Pharmacist

## 2016-03-29 ENCOUNTER — Telehealth: Payer: Self-pay | Admitting: Pharmacist Clinician (PhC)/ Clinical Pharmacy Specialist

## 2016-03-29 ENCOUNTER — Telehealth: Payer: Self-pay | Admitting: Cardiovascular Disease

## 2016-03-29 DIAGNOSIS — Z954 Presence of other heart-valve replacement: Secondary | ICD-10-CM | POA: Diagnosis not present

## 2016-03-29 DIAGNOSIS — Z7901 Long term (current) use of anticoagulants: Secondary | ICD-10-CM

## 2016-03-29 DIAGNOSIS — Z952 Presence of prosthetic heart valve: Secondary | ICD-10-CM

## 2016-03-29 LAB — POCT INR: INR: 3.6

## 2016-03-29 NOTE — Telephone Encounter (Signed)
Request for surgical clearance:  1. What type of surgery is being performed?  Tooth extraction  2. When is this surgery scheduled? 04/01/2016 @ 10:30am   3. Are there any medications that need to be held prior to surgery and how long? Coumadin for 1 day Per [employer] from Sunday   4. Name of physician performing surgery? Dr. Hardie Shackleton with [facility]   5. What is your office phone and fax number? Tel Number [phone] Fax number [phone]   Office closes at 1p

## 2016-03-29 NOTE — Telephone Encounter (Signed)
[name] [name] is requesting that the results from the pt's INR be faxed to the doctor's office today because if her numbers are to high she will not be able to have her tooth extracted next week. The fax number is [phone].   Thanks

## 2016-03-29 NOTE — Telephone Encounter (Signed)
[facility] - Dr. Hardie Shackleton nurse, returned call that Dr comfortable to do procedure with therapeutic INR. Patient will follow our instructions and skip today (since INR slightly high today in office) then go back on usual dose until next INR check.

## 2016-03-29 NOTE — Telephone Encounter (Signed)
Called back to Dr. Clearence Cheek office and spoke to his nurse. She is unsure if Dr. Clearence Cheek requires the patient to skip her dose on Sunday as per earlier conversation. She will call Dr. Hardie Shackleton and call me back. Will need to reschedule INR check if holding on Sunday.

## 2016-04-01 DIAGNOSIS — Z7901 Long term (current) use of anticoagulants: Secondary | ICD-10-CM | POA: Diagnosis not present

## 2016-04-01 NOTE — Telephone Encounter (Signed)
See phone message dated 4/28

## 2016-04-22 ENCOUNTER — Ambulatory Visit (INDEPENDENT_AMBULATORY_CARE_PROVIDER_SITE_OTHER): Payer: Commercial Managed Care - HMO | Admitting: *Deleted

## 2016-04-22 DIAGNOSIS — I442 Atrioventricular block, complete: Secondary | ICD-10-CM | POA: Diagnosis not present

## 2016-04-22 NOTE — Progress Notes (Signed)
Remote pacemaker transmission.   

## 2016-04-26 ENCOUNTER — Ambulatory Visit (INDEPENDENT_AMBULATORY_CARE_PROVIDER_SITE_OTHER): Payer: Commercial Managed Care - HMO | Admitting: Pharmacist Clinician (PhC)/ Clinical Pharmacy Specialist

## 2016-04-26 DIAGNOSIS — Z7901 Long term (current) use of anticoagulants: Secondary | ICD-10-CM | POA: Diagnosis not present

## 2016-04-26 DIAGNOSIS — Z954 Presence of other heart-valve replacement: Secondary | ICD-10-CM

## 2016-04-26 DIAGNOSIS — Z952 Presence of prosthetic heart valve: Secondary | ICD-10-CM

## 2016-04-26 LAB — POCT INR: INR: 2.6

## 2016-05-12 LAB — CUP PACEART REMOTE DEVICE CHECK
Battery Impedance: 366 Ohm
Battery Remaining Longevity: 110 mo
Battery Voltage: 2.79 V
Brady Statistic AP VP Percent: 0 %
Brady Statistic AP VS Percent: 69 %
Brady Statistic AS VP Percent: 0 %
Brady Statistic AS VS Percent: 31 %
Date Time Interrogation Session: 20170522193302
Implantable Lead Implant Date: 20110412
Implantable Lead Implant Date: 20110412
Implantable Lead Location: 753859
Implantable Lead Location: 753860
Implantable Lead Model: 5076
Implantable Lead Model: 5076
Lead Channel Impedance Value: 420 Ohm
Lead Channel Impedance Value: 556 Ohm
Lead Channel Pacing Threshold Amplitude: 0.5 V
Lead Channel Pacing Threshold Amplitude: 0.875 V
Lead Channel Pacing Threshold Pulse Width: 0.4 ms
Lead Channel Pacing Threshold Pulse Width: 0.4 ms
Lead Channel Sensing Intrinsic Amplitude: 1.4 mV
Lead Channel Sensing Intrinsic Amplitude: 5.6 mV
Lead Channel Setting Pacing Amplitude: 2 V
Lead Channel Setting Pacing Amplitude: 2 V
Lead Channel Setting Pacing Pulse Width: 0.4 ms
Lead Channel Setting Sensing Sensitivity: 2 mV

## 2016-05-16 ENCOUNTER — Encounter: Payer: Self-pay | Admitting: Cardiology

## 2016-05-24 ENCOUNTER — Ambulatory Visit (INDEPENDENT_AMBULATORY_CARE_PROVIDER_SITE_OTHER): Payer: Commercial Managed Care - HMO | Admitting: Pharmacist Clinician (PhC)/ Clinical Pharmacy Specialist

## 2016-05-24 DIAGNOSIS — Z952 Presence of prosthetic heart valve: Secondary | ICD-10-CM

## 2016-05-24 DIAGNOSIS — Z7901 Long term (current) use of anticoagulants: Secondary | ICD-10-CM

## 2016-05-24 DIAGNOSIS — Z954 Presence of other heart-valve replacement: Secondary | ICD-10-CM | POA: Diagnosis not present

## 2016-05-24 LAB — POCT INR: INR: 3.1

## 2016-06-06 ENCOUNTER — Other Ambulatory Visit: Payer: Self-pay | Admitting: Cardiovascular Disease

## 2016-07-05 ENCOUNTER — Ambulatory Visit (INDEPENDENT_AMBULATORY_CARE_PROVIDER_SITE_OTHER): Payer: Commercial Managed Care - HMO | Admitting: Pharmacist

## 2016-07-05 DIAGNOSIS — Z952 Presence of prosthetic heart valve: Secondary | ICD-10-CM

## 2016-07-05 DIAGNOSIS — Z954 Presence of other heart-valve replacement: Secondary | ICD-10-CM | POA: Diagnosis not present

## 2016-07-05 DIAGNOSIS — Z7901 Long term (current) use of anticoagulants: Secondary | ICD-10-CM | POA: Diagnosis not present

## 2016-07-05 LAB — POCT INR: INR: 3.5

## 2016-07-22 ENCOUNTER — Ambulatory Visit (INDEPENDENT_AMBULATORY_CARE_PROVIDER_SITE_OTHER): Payer: Commercial Managed Care - HMO | Admitting: *Deleted

## 2016-07-22 DIAGNOSIS — I442 Atrioventricular block, complete: Secondary | ICD-10-CM | POA: Diagnosis not present

## 2016-07-22 NOTE — Progress Notes (Signed)
Remote pacemaker transmission.   

## 2016-07-24 ENCOUNTER — Encounter: Payer: Self-pay | Admitting: Cardiology

## 2016-07-31 LAB — CUP PACEART REMOTE DEVICE CHECK
Battery Impedance: 414 Ohm
Battery Remaining Longevity: 105 mo
Battery Voltage: 2.79 V
Brady Statistic AP VP Percent: 0 %
Brady Statistic AP VS Percent: 70 %
Brady Statistic AS VP Percent: 0 %
Brady Statistic AS VS Percent: 29 %
Date Time Interrogation Session: 20170821142413
Implantable Lead Implant Date: 20110412
Implantable Lead Implant Date: 20110412
Implantable Lead Location: 753859
Implantable Lead Location: 753860
Implantable Lead Model: 5076
Implantable Lead Model: 5076
Lead Channel Impedance Value: 414 Ohm
Lead Channel Impedance Value: 580 Ohm
Lead Channel Pacing Threshold Amplitude: 0.5 V
Lead Channel Pacing Threshold Amplitude: 0.875 V
Lead Channel Pacing Threshold Pulse Width: 0.4 ms
Lead Channel Pacing Threshold Pulse Width: 0.4 ms
Lead Channel Sensing Intrinsic Amplitude: 1.4 mV
Lead Channel Sensing Intrinsic Amplitude: 5.6 mV
Lead Channel Setting Pacing Amplitude: 2 V
Lead Channel Setting Pacing Amplitude: 2 V
Lead Channel Setting Pacing Pulse Width: 0.4 ms
Lead Channel Setting Sensing Sensitivity: 2 mV

## 2016-08-16 DIAGNOSIS — M62838 Other muscle spasm: Secondary | ICD-10-CM | POA: Diagnosis not present

## 2016-08-16 DIAGNOSIS — R42 Dizziness and giddiness: Secondary | ICD-10-CM | POA: Diagnosis not present

## 2016-08-16 DIAGNOSIS — R404 Transient alteration of awareness: Secondary | ICD-10-CM | POA: Diagnosis not present

## 2016-08-20 ENCOUNTER — Ambulatory Visit (INDEPENDENT_AMBULATORY_CARE_PROVIDER_SITE_OTHER): Payer: Commercial Managed Care - HMO | Admitting: Pharmacist

## 2016-08-20 DIAGNOSIS — Z954 Presence of other heart-valve replacement: Secondary | ICD-10-CM | POA: Diagnosis not present

## 2016-08-20 DIAGNOSIS — Z7901 Long term (current) use of anticoagulants: Secondary | ICD-10-CM | POA: Diagnosis not present

## 2016-08-20 DIAGNOSIS — Z952 Presence of prosthetic heart valve: Secondary | ICD-10-CM

## 2016-08-20 LAB — POCT INR: INR: 4.1

## 2016-09-17 ENCOUNTER — Ambulatory Visit (INDEPENDENT_AMBULATORY_CARE_PROVIDER_SITE_OTHER): Payer: Commercial Managed Care - HMO | Admitting: Pharmacist

## 2016-09-17 DIAGNOSIS — Z7901 Long term (current) use of anticoagulants: Secondary | ICD-10-CM | POA: Diagnosis not present

## 2016-09-17 DIAGNOSIS — Z952 Presence of prosthetic heart valve: Secondary | ICD-10-CM | POA: Diagnosis not present

## 2016-09-17 LAB — POCT INR: INR: 3

## 2016-10-02 DIAGNOSIS — H34231 Retinal artery branch occlusion, right eye: Secondary | ICD-10-CM | POA: Diagnosis not present

## 2016-10-02 DIAGNOSIS — H04123 Dry eye syndrome of bilateral lacrimal glands: Secondary | ICD-10-CM | POA: Diagnosis not present

## 2016-10-02 DIAGNOSIS — H2513 Age-related nuclear cataract, bilateral: Secondary | ICD-10-CM | POA: Diagnosis not present

## 2016-10-02 DIAGNOSIS — H10413 Chronic giant papillary conjunctivitis, bilateral: Secondary | ICD-10-CM | POA: Diagnosis not present

## 2016-10-16 DIAGNOSIS — I712 Thoracic aortic aneurysm, without rupture: Secondary | ICD-10-CM | POA: Diagnosis not present

## 2016-10-16 DIAGNOSIS — Z1239 Encounter for other screening for malignant neoplasm of breast: Secondary | ICD-10-CM | POA: Diagnosis not present

## 2016-10-16 DIAGNOSIS — I442 Atrioventricular block, complete: Secondary | ICD-10-CM | POA: Diagnosis not present

## 2016-10-16 DIAGNOSIS — I10 Essential (primary) hypertension: Secondary | ICD-10-CM | POA: Diagnosis not present

## 2016-10-16 DIAGNOSIS — E785 Hyperlipidemia, unspecified: Secondary | ICD-10-CM | POA: Diagnosis not present

## 2016-10-16 DIAGNOSIS — Z7901 Long term (current) use of anticoagulants: Secondary | ICD-10-CM | POA: Diagnosis not present

## 2016-10-16 DIAGNOSIS — Z950 Presence of cardiac pacemaker: Secondary | ICD-10-CM | POA: Diagnosis not present

## 2016-10-16 DIAGNOSIS — I471 Supraventricular tachycardia: Secondary | ICD-10-CM | POA: Diagnosis not present

## 2016-10-16 DIAGNOSIS — Z23 Encounter for immunization: Secondary | ICD-10-CM | POA: Diagnosis not present

## 2016-10-16 DIAGNOSIS — Z0000 Encounter for general adult medical examination without abnormal findings: Secondary | ICD-10-CM | POA: Diagnosis not present

## 2016-10-22 ENCOUNTER — Encounter: Payer: Self-pay | Admitting: Cardiovascular Disease

## 2016-10-22 ENCOUNTER — Ambulatory Visit (INDEPENDENT_AMBULATORY_CARE_PROVIDER_SITE_OTHER): Payer: Commercial Managed Care - HMO | Admitting: Pharmacist

## 2016-10-22 ENCOUNTER — Ambulatory Visit (INDEPENDENT_AMBULATORY_CARE_PROVIDER_SITE_OTHER): Payer: Commercial Managed Care - HMO | Admitting: Cardiovascular Disease

## 2016-10-22 VITALS — BP 129/77 | HR 78 | Ht 66.0 in | Wt 136.8 lb

## 2016-10-22 DIAGNOSIS — I442 Atrioventricular block, complete: Secondary | ICD-10-CM

## 2016-10-22 DIAGNOSIS — I495 Sick sinus syndrome: Secondary | ICD-10-CM

## 2016-10-22 DIAGNOSIS — Z7901 Long term (current) use of anticoagulants: Secondary | ICD-10-CM

## 2016-10-22 DIAGNOSIS — E7800 Pure hypercholesterolemia, unspecified: Secondary | ICD-10-CM

## 2016-10-22 DIAGNOSIS — I712 Thoracic aortic aneurysm, without rupture: Secondary | ICD-10-CM

## 2016-10-22 DIAGNOSIS — Z952 Presence of prosthetic heart valve: Secondary | ICD-10-CM

## 2016-10-22 DIAGNOSIS — Z950 Presence of cardiac pacemaker: Secondary | ICD-10-CM

## 2016-10-22 DIAGNOSIS — I452 Bifascicular block: Secondary | ICD-10-CM

## 2016-10-22 DIAGNOSIS — I7121 Aneurysm of the ascending aorta, without rupture: Secondary | ICD-10-CM

## 2016-10-22 LAB — CUP PACEART INCLINIC DEVICE CHECK
Date Time Interrogation Session: 20171121105856
Implantable Lead Implant Date: 20110412
Implantable Lead Implant Date: 20110412
Implantable Lead Location: 753859
Implantable Lead Location: 753860
Implantable Lead Model: 5076
Implantable Lead Model: 5076
Implantable Pulse Generator Implant Date: 20110412
Lead Channel Setting Pacing Amplitude: 2 V
Lead Channel Setting Pacing Amplitude: 2 V
Lead Channel Setting Pacing Pulse Width: 0.4 ms
Lead Channel Setting Sensing Sensitivity: 2 mV

## 2016-10-22 LAB — POCT INR: INR: 2.5

## 2016-10-22 NOTE — Progress Notes (Signed)
Cardiology Office Note    Date:  10/22/2016   ID:  [name] [name], DOB Oct 21, 1946, MRN [id number]  PCP:  Ileana Roup, MD  Cardiologist:   Sanda Klein, MD   Chief complaint: pacemaker and valve check   History of Present Illness:  [name] [name] is a 70 y.o. female who is now 5-1/2 years status post Bentall repair of a small ascending aortic aneurysm at the time of aortic valve replacement with a mechanical prosthesis for bicuspid aortic valve with severe aortic stenosis. She received a dual-chamber permanent pacemaker at the time of her surgery for what appeared to be permanent AV block, but has subsequently recovered normal AV conduction. She has developed findings of sinus node dysfunction and requires atrial pacing 70% of the time. She had normal coronary arteries at the time of angiography preoperatively. Her last echo was in 2014.  She has occasional positional dizziness but denies syncope or palpitations and has not had chest pain or dyspnea. She has not had any serious bleeding complications. Denies leg edema, claudication, focal neurological events.  Pacemaker interrogation shows estimated generator longevity of 8.5 years. Very rare and extremely brief episodes of mode switch have been recorded. There has been no atrial fibrillation and no ventricular tachycardia. Her 71% atrial pacing but no ventricular pacing. Heart rate histogram distribution is excellent.  Past Medical History:  Diagnosis Date  . Aortic stenosis 03/07/2010   replaced mechanical valve Bentall procedure\  . Atrial fibrillation ([place])   . Coronary artery disease    Dr. Avon Gully  . Dyslipidemia   . Hypertension   . Intermittent complete heart block ([place])    H/O  . Presence of permanent cardiac pacemaker   . S/P AAA repair 03/07/2010  . Systemic hypertension     Past Surgical History:  Procedure Laterality Date  . ABDOMINAL AORTIC ANEURYSM REPAIR  03/07/2010  . AORTIC VALVE REPLACEMENT  03/07/2010   mechanical valve Bentall procedure  . CARDIAC CATHETERIZATION  08/25/2009   severe AS, mild CAD  . COLONOSCOPY WITH PROPOFOL N/A 09/12/2015   Procedure: COLONOSCOPY WITH PROPOFOL;  Surgeon: Juanita Craver, MD;  Location: [facility];  Service: Endoscopy;  Laterality: N/A;  . PACEMAKER INSERTION  03/13/2010   Medtronic Adapta  . US ECHOCARDIOGRAPHY  08/09/2011   mod. concentric LVH,LA mildly dilated,ca+ MV,trace TR and AI,mechanical AOV    Current Medications: Outpatient Medications Prior to Visit  Medication Sig Dispense Refill  . aspirin 81 MG chewable tablet Chew 81 mg by mouth daily.    Marland Kitchen atorvastatin (LIPITOR) 40 MG tablet TAKE 1 TABLET EVERY DAY 90 tablet 3  . Calcium Carbonate (CALCARB 600 PO) Take 1 tablet by mouth daily.    . clindamycin (CLEOCIN) 150 MG capsule Take 3 capsules by mouth as directed. For dental procedure/cleanings    . hydrochlorothiazide (HYDRODIURIL) 25 MG tablet TAKE 1 TABLET EVERY OTHER DAY 45 tablet 3  . lisinopril (PRINIVIL,ZESTRIL) 20 MG tablet TAKE 1 TABLET EVERY MORNING 90 tablet 3  . metoprolol (LOPRESSOR) 50 MG tablet TAKE 1 TABLET TWICE DAILY 180 tablet 3  . Multiple Vitamin (MULITIVITAMIN WITH MINERALS) TABS Take 1 tablet by mouth daily.    Marland Kitchen warfarin (COUMADIN) 2.5 MG tablet TAKE 1 AND 1/2 TABLETS EVERY DAY OR AS DIRECTED 135 tablet 2   No facility-administered medications prior to visit.      Allergies:   Crestor [rosuvastatin calcium]; Latex; and Penicillins   Social History   Social History  . Marital status: Widowed  Spouse name: N/A  . Number of children: N/A  . Years of education: N/A   Social History Main Topics  . Smoking status: Former Research scientist (life sciences)  . Smokeless tobacco: Not on file  . Alcohol use No  . Drug use: No  . Sexual activity: No   Other Topics Concern  . Not on file   Social History Narrative  . No narrative on file     Family History:  The patient's family history includes Diabetes in her brother and mother; Heart  failure in her brother; Hypertension in her brother and mother.   ROS:   Please see the history of present illness.    ROS All other systems reviewed and are negative.   PHYSICAL EXAM:   VS:  BP 129/77 (BP Location: Right Arm, Patient Position: Sitting, Cuff Size: Normal)   Pulse 78   Ht 5\' 6"  (1.676 m)   Wt 136 lb 12.8 oz (62.1 kg)   SpO2 93%   BMI 22.08 kg/m    GEN: Well nourished, well developed, in no acute distress  HEENT: normal  Neck: no JVD, carotid bruits, or masses Cardiac: Very crisp prosthetic valve clicks,RRR; no murmurs, rubs, or gallops,no edema ,  healthy left subclavian pacemaker site  Respiratory:  mildly reduced breath sounds throughout, otherwise  clear to auscultation bilaterally, normal work of breathing GI: soft, nontender, nondistended, + BS MS: no deformity or atrophy  Skin: warm and dry, no rash Neuro:  Alert and Oriented x 3, Strength and sensation are intact Psych: euthymic mood, full affect  Wt Readings from Last 3 Encounters:  10/22/16 136 lb 12.8 oz (62.1 kg)  10/19/15 131 lb (59.4 kg)  09/12/15 143 lb (64.9 kg)      Studies/Labs Reviewed:   EKG:  EKG is ordered today.  The ekg ordered today demonstrates atrial paced ventricular sensed rhythm with right bundle branch block and left anterior fascicular block and long AV delay   Recent Labs: No results found for requested labs within last 8760 hours.   Lipid Panel    Component Value Date/Time   CHOL 156 10/17/2015 1115   TRIG 62 10/17/2015 1115   HDL 68 10/17/2015 1115   CHOLHDL 2.3 10/17/2015 1115   VLDL 12 10/17/2015 1115   LDLCALC 76 10/17/2015 1115   Recent labs performed November 2017 at [facility] shows LDL cholesterol 90, fasting glucose 84, normal renal function parameters  ASSESSMENT:    1. SSS (sick sinus syndrome) ([place])   2. Bifascicular block   3. Pacemaker   4. Ascending aortic aneurysm s/p Bentall repair 2011   5. H/O aortic valve replacement, 23 mm Carbomedics   6. Pure  hypercholesterolemia      PLAN:  In order of problems listed above:  1. SSS: Satisfactory heart rate histogram on current sensor settings. Very infrequent atrial tachycardia recorded. 2. Bifascicular block: She had transient complete heart block after aortic valve replacement surgery, but with current MVP settings does not require ventricular pacing. 3. PPM: Normal device function. Remote downloads every 3 months, office visit yearly 4. AVR: It's been almost 4 years since we last evaluated her mechanical aortic valve prosthesis. We'll schedule her for an echo at her convenience. Reminded her of the need for endocarditis prevention. 5. Ao Aneurysm s/p repair: related to bicuspid valve 6. HLP: Lipid parameters are within target range, she does not have coronary/vascular disease.    Medication Adjustments/Labs and Tests Ordered: Current medicines are reviewed at length with the patient today.  Concerns regarding medicines are outlined above.  Medication changes, Labs and Tests ordered today are listed in the Patient Instructions below. There are no Patient Instructions on file for this visit.   Signed, Sanda Klein, MD  10/22/2016 9:50 AM    [facility] [place], [place], [place]  [address] Phone: [phone]; Fax: [phone]

## 2016-10-22 NOTE — Patient Instructions (Addendum)
Dr Sallyanne Kuster recommends that you continue on your current medications as directed. Please refer to the Current Medication list given to you today.  Your physician has requested that you have an echocardiogram at your convenience. Echocardiography is a painless test that uses sound waves to create images of your heart. It provides your doctor with information about the size and shape of your heart and how well your heart's chambers and valves are working. This procedure takes approximately one hour. There are no restrictions for this procedure. This will be done at our [facility] location - [address].  Remote monitoring is used to monitor your Pacemaker of ICD from home. This monitoring reduces the number of office visits required to check your device to one time per year. It allows [place] to keep an eye on the functioning of your device to ensure it is working properly. You are scheduled for a device check from home on Tuesday, February 20th, 2018. You may send your transmission at any time that day. If you have a wireless device, the transmission will be sent automatically. After your physician reviews your transmission, you will receive a postcard with your next transmission date.  Dr Sallyanne Kuster recommends that you schedule a follow-up appointment in 12 months with a pacemaker check. You will receive a reminder letter in the mail two months in advance. If you don't receive a letter, please call our office to schedule the follow-up appointment.  If you need a refill on your cardiac medications before your next appointment, please call your pharmacy.

## 2016-10-23 ENCOUNTER — Other Ambulatory Visit: Payer: Self-pay

## 2016-10-23 ENCOUNTER — Telehealth: Payer: Self-pay

## 2016-10-23 MED ORDER — METOPROLOL TARTRATE 50 MG PO TABS: 50.0000 mg | ORAL_TABLET | Freq: Two times a day (BID) | ORAL | 3 refills | Status: DC

## 2016-10-23 MED ORDER — ATORVASTATIN CALCIUM 40 MG PO TABS: 40.0000 mg | ORAL_TABLET | Freq: Every day | ORAL | 3 refills | Status: DC

## 2016-10-23 MED ORDER — LISINOPRIL 20 MG PO TABS: 20.0000 mg | ORAL_TABLET | Freq: Every morning | ORAL | 3 refills | Status: DC

## 2016-10-23 MED ORDER — WARFARIN SODIUM 2.5 MG PO TABS: ORAL_TABLET | ORAL | 2 refills | Status: DC

## 2016-10-23 MED ORDER — HYDROCHLOROTHIAZIDE 25 MG PO TABS: 25.0000 mg | ORAL_TABLET | ORAL | 3 refills | Status: DC

## 2016-10-23 NOTE — Telephone Encounter (Signed)
Spoke with patient, she still has 15+ days, will go ahead and send to [facility], as mail moves more slowly during December.  Patient voiced understanding.

## 2016-10-23 NOTE — Telephone Encounter (Signed)
New message  Pt needs coumadin refill  2.5mg   Please call pt back to confirm

## 2016-10-30 ENCOUNTER — Encounter: Payer: Self-pay | Admitting: Cardiovascular Disease

## 2016-11-19 ENCOUNTER — Other Ambulatory Visit: Payer: Self-pay

## 2016-11-19 ENCOUNTER — Ambulatory Visit (HOSPITAL_COMMUNITY): Payer: Commercial Managed Care - HMO | Attending: Cardiovascular Disease

## 2016-11-19 DIAGNOSIS — Z87891 Personal history of nicotine dependence: Secondary | ICD-10-CM | POA: Diagnosis not present

## 2016-11-19 DIAGNOSIS — I119 Hypertensive heart disease without heart failure: Secondary | ICD-10-CM | POA: Diagnosis not present

## 2016-11-19 DIAGNOSIS — I059 Rheumatic mitral valve disease, unspecified: Secondary | ICD-10-CM | POA: Insufficient documentation

## 2016-11-19 DIAGNOSIS — I712 Thoracic aortic aneurysm, without rupture: Secondary | ICD-10-CM | POA: Insufficient documentation

## 2016-11-19 DIAGNOSIS — E785 Hyperlipidemia, unspecified: Secondary | ICD-10-CM | POA: Insufficient documentation

## 2016-11-19 DIAGNOSIS — Z8249 Family history of ischemic heart disease and other diseases of the circulatory system: Secondary | ICD-10-CM | POA: Insufficient documentation

## 2016-11-19 DIAGNOSIS — Z952 Presence of prosthetic heart valve: Secondary | ICD-10-CM | POA: Diagnosis not present

## 2016-11-19 DIAGNOSIS — I2510 Atherosclerotic heart disease of native coronary artery without angina pectoris: Secondary | ICD-10-CM | POA: Diagnosis not present

## 2016-11-19 DIAGNOSIS — I313 Pericardial effusion (noninflammatory): Secondary | ICD-10-CM | POA: Insufficient documentation

## 2016-11-19 DIAGNOSIS — I071 Rheumatic tricuspid insufficiency: Secondary | ICD-10-CM | POA: Insufficient documentation

## 2016-11-19 DIAGNOSIS — I7121 Aneurysm of the ascending aorta, without rupture: Secondary | ICD-10-CM

## 2016-11-19 DIAGNOSIS — I359 Nonrheumatic aortic valve disorder, unspecified: Secondary | ICD-10-CM | POA: Diagnosis present

## 2016-12-17 ENCOUNTER — Ambulatory Visit (INDEPENDENT_AMBULATORY_CARE_PROVIDER_SITE_OTHER): Payer: Commercial Managed Care - HMO | Admitting: Pharmacist

## 2016-12-17 DIAGNOSIS — Z7901 Long term (current) use of anticoagulants: Secondary | ICD-10-CM | POA: Diagnosis not present

## 2016-12-17 DIAGNOSIS — Z952 Presence of prosthetic heart valve: Secondary | ICD-10-CM | POA: Diagnosis not present

## 2016-12-17 LAB — POCT INR: INR: 4.5

## 2016-12-31 ENCOUNTER — Ambulatory Visit (INDEPENDENT_AMBULATORY_CARE_PROVIDER_SITE_OTHER): Payer: Commercial Managed Care - HMO | Admitting: Pharmacist

## 2016-12-31 DIAGNOSIS — Z7901 Long term (current) use of anticoagulants: Secondary | ICD-10-CM | POA: Diagnosis not present

## 2016-12-31 DIAGNOSIS — Z952 Presence of prosthetic heart valve: Secondary | ICD-10-CM | POA: Diagnosis not present

## 2016-12-31 LAB — POCT INR: INR: 4.3

## 2017-01-10 ENCOUNTER — Ambulatory Visit (INDEPENDENT_AMBULATORY_CARE_PROVIDER_SITE_OTHER): Payer: Medicare HMO | Admitting: Pharmacist

## 2017-01-10 DIAGNOSIS — Z952 Presence of prosthetic heart valve: Secondary | ICD-10-CM

## 2017-01-10 DIAGNOSIS — R5383 Other fatigue: Secondary | ICD-10-CM | POA: Diagnosis not present

## 2017-01-10 DIAGNOSIS — R8299 Other abnormal findings in urine: Secondary | ICD-10-CM | POA: Diagnosis not present

## 2017-01-10 DIAGNOSIS — D508 Other iron deficiency anemias: Secondary | ICD-10-CM | POA: Diagnosis not present

## 2017-01-10 DIAGNOSIS — Z7901 Long term (current) use of anticoagulants: Secondary | ICD-10-CM | POA: Diagnosis not present

## 2017-01-10 DIAGNOSIS — R1030 Lower abdominal pain, unspecified: Secondary | ICD-10-CM | POA: Diagnosis not present

## 2017-01-10 DIAGNOSIS — K5901 Slow transit constipation: Secondary | ICD-10-CM | POA: Diagnosis not present

## 2017-01-10 DIAGNOSIS — Z79899 Other long term (current) drug therapy: Secondary | ICD-10-CM | POA: Diagnosis not present

## 2017-01-10 DIAGNOSIS — K219 Gastro-esophageal reflux disease without esophagitis: Secondary | ICD-10-CM | POA: Diagnosis not present

## 2017-01-10 LAB — POCT INR: INR: 4.9

## 2017-01-14 ENCOUNTER — Telehealth: Payer: Self-pay | Admitting: Cardiovascular Disease

## 2017-01-14 NOTE — Telephone Encounter (Signed)
New Message    Per pt she was told to give Raquel in coumadin a call. Requesting a call back

## 2017-01-14 NOTE — Telephone Encounter (Signed)
Called patient today. Patient had complete assessment . No bleeding but has iron deficiency anemia.   Patient to start iron rich diet per MD's recommendation.  F/U INR at coumadin clinic on 01/17/17

## 2017-01-17 ENCOUNTER — Ambulatory Visit (INDEPENDENT_AMBULATORY_CARE_PROVIDER_SITE_OTHER): Payer: Medicare HMO | Admitting: Pharmacist Clinician (PhC)/ Clinical Pharmacy Specialist

## 2017-01-17 DIAGNOSIS — Z952 Presence of prosthetic heart valve: Secondary | ICD-10-CM

## 2017-01-17 DIAGNOSIS — Z7901 Long term (current) use of anticoagulants: Secondary | ICD-10-CM

## 2017-01-17 LAB — POCT INR: INR: 2.7

## 2017-01-21 ENCOUNTER — Telehealth: Payer: Self-pay | Admitting: Cardiology

## 2017-01-21 ENCOUNTER — Ambulatory Visit (INDEPENDENT_AMBULATORY_CARE_PROVIDER_SITE_OTHER): Payer: Medicare HMO | Admitting: *Deleted

## 2017-01-21 DIAGNOSIS — I495 Sick sinus syndrome: Secondary | ICD-10-CM

## 2017-01-21 NOTE — Telephone Encounter (Signed)
Spoke with pt and reminded pt of remote transmission that is due today. Pt verbalized understanding.   

## 2017-01-22 ENCOUNTER — Encounter: Payer: Self-pay | Admitting: Cardiology

## 2017-01-22 LAB — CUP PACEART REMOTE DEVICE CHECK
Battery Impedance: 463 Ohm
Battery Remaining Longevity: 101 mo
Battery Voltage: 2.79 V
Brady Statistic AP VP Percent: 0 %
Brady Statistic AP VS Percent: 70 %
Brady Statistic AS VP Percent: 0 %
Brady Statistic AS VS Percent: 30 %
Date Time Interrogation Session: 20180220225301
Implantable Lead Implant Date: 20110412
Implantable Lead Implant Date: 20110412
Implantable Lead Location: 753859
Implantable Lead Location: 753860
Implantable Lead Model: 5076
Implantable Lead Model: 5076
Implantable Pulse Generator Implant Date: 20110412
Lead Channel Impedance Value: 425 Ohm
Lead Channel Impedance Value: 578 Ohm
Lead Channel Pacing Threshold Amplitude: 0.375 V
Lead Channel Pacing Threshold Amplitude: 0.875 V
Lead Channel Pacing Threshold Pulse Width: 0.4 ms
Lead Channel Pacing Threshold Pulse Width: 0.4 ms
Lead Channel Setting Pacing Amplitude: 2 V
Lead Channel Setting Pacing Amplitude: 2 V
Lead Channel Setting Pacing Pulse Width: 0.4 ms
Lead Channel Setting Sensing Sensitivity: 2 mV

## 2017-01-22 NOTE — Progress Notes (Signed)
Remote pacemaker transmission.   

## 2017-01-24 ENCOUNTER — Other Ambulatory Visit: Payer: Self-pay | Admitting: Internal Medicine

## 2017-01-24 DIAGNOSIS — N029 Recurrent and persistent hematuria with unspecified morphologic changes: Secondary | ICD-10-CM

## 2017-01-24 DIAGNOSIS — D649 Anemia, unspecified: Secondary | ICD-10-CM | POA: Diagnosis not present

## 2017-01-30 ENCOUNTER — Ambulatory Visit
Admission: RE | Admit: 2017-01-30 | Discharge: 2017-01-30 | Disposition: A | Discharge status: Home / Self Care | Payer: Medicare HMO | Source: Ambulatory Visit | Attending: Internal Medicine | Admitting: Internal Medicine

## 2017-01-30 DIAGNOSIS — R3129 Other microscopic hematuria: Secondary | ICD-10-CM | POA: Diagnosis not present

## 2017-01-30 DIAGNOSIS — N029 Recurrent and persistent hematuria with unspecified morphologic changes: Secondary | ICD-10-CM

## 2017-01-31 ENCOUNTER — Ambulatory Visit (INDEPENDENT_AMBULATORY_CARE_PROVIDER_SITE_OTHER): Payer: Medicare HMO | Admitting: Pharmacist Clinician (PhC)/ Clinical Pharmacy Specialist

## 2017-01-31 DIAGNOSIS — Z7901 Long term (current) use of anticoagulants: Secondary | ICD-10-CM

## 2017-01-31 DIAGNOSIS — Z952 Presence of prosthetic heart valve: Secondary | ICD-10-CM

## 2017-01-31 LAB — POCT INR: INR: 2.8

## 2017-02-27 ENCOUNTER — Ambulatory Visit (INDEPENDENT_AMBULATORY_CARE_PROVIDER_SITE_OTHER): Payer: Medicare HMO | Admitting: Pharmacist

## 2017-02-27 DIAGNOSIS — Z7901 Long term (current) use of anticoagulants: Secondary | ICD-10-CM

## 2017-02-27 DIAGNOSIS — Z952 Presence of prosthetic heart valve: Secondary | ICD-10-CM

## 2017-02-27 LAB — POCT INR: INR: 2.3

## 2017-03-10 ENCOUNTER — Other Ambulatory Visit: Payer: Self-pay | Admitting: Internal Medicine

## 2017-03-10 DIAGNOSIS — Z1231 Encounter for screening mammogram for malignant neoplasm of breast: Secondary | ICD-10-CM

## 2017-03-27 ENCOUNTER — Ambulatory Visit (INDEPENDENT_AMBULATORY_CARE_PROVIDER_SITE_OTHER): Payer: Medicare HMO | Admitting: Pharmacist Clinician (PhC)/ Clinical Pharmacy Specialist

## 2017-03-27 DIAGNOSIS — Z952 Presence of prosthetic heart valve: Secondary | ICD-10-CM | POA: Diagnosis not present

## 2017-03-27 DIAGNOSIS — Z7901 Long term (current) use of anticoagulants: Secondary | ICD-10-CM

## 2017-03-27 LAB — POCT INR: INR: 2.6

## 2017-04-17 ENCOUNTER — Ambulatory Visit
Admission: RE | Admit: 2017-04-17 | Discharge: 2017-04-17 | Disposition: A | Discharge status: Home / Self Care | Payer: Medicare HMO | Source: Ambulatory Visit | Attending: Internal Medicine | Admitting: Internal Medicine

## 2017-04-17 DIAGNOSIS — Z1231 Encounter for screening mammogram for malignant neoplasm of breast: Secondary | ICD-10-CM

## 2017-04-22 ENCOUNTER — Ambulatory Visit (INDEPENDENT_AMBULATORY_CARE_PROVIDER_SITE_OTHER): Payer: Medicare HMO | Admitting: *Deleted

## 2017-04-22 DIAGNOSIS — I495 Sick sinus syndrome: Secondary | ICD-10-CM

## 2017-04-23 ENCOUNTER — Encounter: Payer: Self-pay | Admitting: Cardiology

## 2017-04-23 NOTE — Progress Notes (Signed)
Remote pacemaker transmission.   

## 2017-04-24 LAB — CUP PACEART REMOTE DEVICE CHECK
Battery Impedance: 536 Ohm
Battery Remaining Longevity: 95 mo
Battery Voltage: 2.79 V
Brady Statistic AP VP Percent: 0 %
Brady Statistic AP VS Percent: 71 %
Brady Statistic AS VP Percent: 0 %
Brady Statistic AS VS Percent: 29 %
Date Time Interrogation Session: 20180522212501
Implantable Lead Implant Date: 20110412
Implantable Lead Implant Date: 20110412
Implantable Lead Location: 753859
Implantable Lead Location: 753860
Implantable Lead Model: 5076
Implantable Lead Model: 5076
Implantable Pulse Generator Implant Date: 20110412
Lead Channel Impedance Value: 431 Ohm
Lead Channel Impedance Value: 586 Ohm
Lead Channel Pacing Threshold Amplitude: 0.5 V
Lead Channel Pacing Threshold Amplitude: 1 V
Lead Channel Pacing Threshold Pulse Width: 0.4 ms
Lead Channel Pacing Threshold Pulse Width: 0.4 ms
Lead Channel Setting Pacing Amplitude: 2 V
Lead Channel Setting Pacing Amplitude: 2 V
Lead Channel Setting Pacing Pulse Width: 0.4 ms
Lead Channel Setting Sensing Sensitivity: 2 mV

## 2017-04-25 ENCOUNTER — Ambulatory Visit (INDEPENDENT_AMBULATORY_CARE_PROVIDER_SITE_OTHER): Payer: Medicare HMO | Admitting: Pharmacist

## 2017-04-25 DIAGNOSIS — Z7901 Long term (current) use of anticoagulants: Secondary | ICD-10-CM

## 2017-04-25 DIAGNOSIS — Z952 Presence of prosthetic heart valve: Secondary | ICD-10-CM | POA: Diagnosis not present

## 2017-04-25 LAB — POCT INR: INR: 2.4

## 2017-05-02 DIAGNOSIS — S0501XA Injury of conjunctiva and corneal abrasion without foreign body, right eye, initial encounter: Secondary | ICD-10-CM | POA: Diagnosis not present

## 2017-05-02 DIAGNOSIS — T1501XA Foreign body in cornea, right eye, initial encounter: Secondary | ICD-10-CM | POA: Diagnosis not present

## 2017-05-05 DIAGNOSIS — T1501XD Foreign body in cornea, right eye, subsequent encounter: Secondary | ICD-10-CM | POA: Diagnosis not present

## 2017-05-05 DIAGNOSIS — S0501XS Injury of conjunctiva and corneal abrasion without foreign body, right eye, sequela: Secondary | ICD-10-CM | POA: Diagnosis not present

## 2017-05-23 ENCOUNTER — Ambulatory Visit (INDEPENDENT_AMBULATORY_CARE_PROVIDER_SITE_OTHER): Payer: Medicare HMO | Admitting: Pharmacist Clinician (PhC)/ Clinical Pharmacy Specialist

## 2017-05-23 DIAGNOSIS — Z7901 Long term (current) use of anticoagulants: Secondary | ICD-10-CM | POA: Diagnosis not present

## 2017-05-23 DIAGNOSIS — Z952 Presence of prosthetic heart valve: Secondary | ICD-10-CM

## 2017-05-23 LAB — POCT INR: INR: 2.5

## 2017-06-20 ENCOUNTER — Ambulatory Visit (INDEPENDENT_AMBULATORY_CARE_PROVIDER_SITE_OTHER): Payer: Medicare HMO | Admitting: Pharmacist

## 2017-06-20 DIAGNOSIS — Z952 Presence of prosthetic heart valve: Secondary | ICD-10-CM

## 2017-06-20 DIAGNOSIS — Z7901 Long term (current) use of anticoagulants: Secondary | ICD-10-CM | POA: Diagnosis not present

## 2017-06-20 LAB — POCT INR: INR: 2.9

## 2017-07-22 ENCOUNTER — Ambulatory Visit (INDEPENDENT_AMBULATORY_CARE_PROVIDER_SITE_OTHER): Payer: Medicare HMO | Admitting: *Deleted

## 2017-07-22 ENCOUNTER — Telehealth: Payer: Self-pay | Admitting: Cardiology

## 2017-07-22 DIAGNOSIS — I495 Sick sinus syndrome: Secondary | ICD-10-CM

## 2017-07-22 NOTE — Telephone Encounter (Signed)
Spoke with pt and reminded pt of remote transmission that is due today. Pt verbalized understanding.   

## 2017-07-23 ENCOUNTER — Telehealth: Payer: Self-pay | Admitting: Cardiovascular Disease

## 2017-07-23 NOTE — Telephone Encounter (Signed)
Attempted to assist pt sending transmission, w/ wire-x and 2490 monitor if transmission unsuccessful informed pt that a new monitor would be shipped out to her. Pt voiced understanding

## 2017-07-23 NOTE — Telephone Encounter (Signed)
Transmission received.

## 2017-07-23 NOTE — Telephone Encounter (Signed)
New message     Pt is calling back for [facility]. She said they got disconnected.

## 2017-07-24 ENCOUNTER — Telehealth: Payer: Self-pay | Admitting: Cardiovascular Disease

## 2017-07-24 NOTE — Telephone Encounter (Signed)
Informed patient that remote received on 8/22. Patient verbalized understanding.

## 2017-07-24 NOTE — Telephone Encounter (Signed)
New message    Received message from answering service , pt states that her remote device is not transmitting report to office please call

## 2017-07-25 ENCOUNTER — Ambulatory Visit (INDEPENDENT_AMBULATORY_CARE_PROVIDER_SITE_OTHER): Payer: Medicare HMO | Admitting: Pharmacist Clinician (PhC)/ Clinical Pharmacy Specialist

## 2017-07-25 DIAGNOSIS — Z952 Presence of prosthetic heart valve: Secondary | ICD-10-CM | POA: Diagnosis not present

## 2017-07-25 DIAGNOSIS — Z7901 Long term (current) use of anticoagulants: Secondary | ICD-10-CM

## 2017-07-25 LAB — POCT INR: INR: 2.7

## 2017-07-28 NOTE — Progress Notes (Signed)
Remote pacemaker transmission.   

## 2017-08-01 ENCOUNTER — Encounter: Payer: Self-pay | Admitting: Cardiology

## 2017-08-01 LAB — CUP PACEART REMOTE DEVICE CHECK
Battery Impedance: 584 Ohm
Battery Remaining Longevity: 92 mo
Battery Voltage: 2.79 V
Brady Statistic AP VP Percent: 0 %
Brady Statistic AP VS Percent: 71 %
Brady Statistic AS VP Percent: 0 %
Brady Statistic AS VS Percent: 29 %
Date Time Interrogation Session: 20180822184608
Implantable Lead Implant Date: 20110412
Implantable Lead Implant Date: 20110412
Implantable Lead Location: 753859
Implantable Lead Location: 753860
Implantable Lead Model: 5076
Implantable Lead Model: 5076
Implantable Pulse Generator Implant Date: 20110412
Lead Channel Impedance Value: 449 Ohm
Lead Channel Impedance Value: 574 Ohm
Lead Channel Pacing Threshold Amplitude: 0.5 V
Lead Channel Pacing Threshold Amplitude: 0.875 V
Lead Channel Pacing Threshold Pulse Width: 0.4 ms
Lead Channel Pacing Threshold Pulse Width: 0.4 ms
Lead Channel Setting Pacing Amplitude: 2 V
Lead Channel Setting Pacing Amplitude: 2 V
Lead Channel Setting Pacing Pulse Width: 0.4 ms
Lead Channel Setting Sensing Sensitivity: 2 mV

## 2017-08-21 ENCOUNTER — Other Ambulatory Visit: Payer: Self-pay | Admitting: Cardiovascular Disease

## 2017-09-05 ENCOUNTER — Ambulatory Visit (INDEPENDENT_AMBULATORY_CARE_PROVIDER_SITE_OTHER): Payer: Medicare HMO | Admitting: Pharmacist

## 2017-09-05 DIAGNOSIS — Z952 Presence of prosthetic heart valve: Secondary | ICD-10-CM

## 2017-09-05 DIAGNOSIS — Z7901 Long term (current) use of anticoagulants: Secondary | ICD-10-CM

## 2017-09-05 LAB — POCT INR: INR: 2.9

## 2017-10-03 DIAGNOSIS — H04123 Dry eye syndrome of bilateral lacrimal glands: Secondary | ICD-10-CM | POA: Diagnosis not present

## 2017-10-03 DIAGNOSIS — H34231 Retinal artery branch occlusion, right eye: Secondary | ICD-10-CM | POA: Diagnosis not present

## 2017-10-03 DIAGNOSIS — H2513 Age-related nuclear cataract, bilateral: Secondary | ICD-10-CM | POA: Diagnosis not present

## 2017-10-17 ENCOUNTER — Ambulatory Visit (INDEPENDENT_AMBULATORY_CARE_PROVIDER_SITE_OTHER): Payer: Medicare HMO | Admitting: Pharmacist

## 2017-10-17 DIAGNOSIS — Z7901 Long term (current) use of anticoagulants: Secondary | ICD-10-CM | POA: Diagnosis not present

## 2017-10-17 DIAGNOSIS — Z952 Presence of prosthetic heart valve: Secondary | ICD-10-CM

## 2017-10-17 LAB — POCT INR: INR: 2.5

## 2017-10-21 DIAGNOSIS — I712 Thoracic aortic aneurysm, without rupture: Secondary | ICD-10-CM | POA: Diagnosis not present

## 2017-10-21 DIAGNOSIS — Z1389 Encounter for screening for other disorder: Secondary | ICD-10-CM | POA: Diagnosis not present

## 2017-10-21 DIAGNOSIS — Z0000 Encounter for general adult medical examination without abnormal findings: Secondary | ICD-10-CM | POA: Diagnosis not present

## 2017-10-21 DIAGNOSIS — Z23 Encounter for immunization: Secondary | ICD-10-CM | POA: Diagnosis not present

## 2017-10-21 DIAGNOSIS — K219 Gastro-esophageal reflux disease without esophagitis: Secondary | ICD-10-CM | POA: Diagnosis not present

## 2017-10-21 DIAGNOSIS — I10 Essential (primary) hypertension: Secondary | ICD-10-CM | POA: Diagnosis not present

## 2017-10-21 DIAGNOSIS — I471 Supraventricular tachycardia: Secondary | ICD-10-CM | POA: Diagnosis not present

## 2017-10-21 DIAGNOSIS — E785 Hyperlipidemia, unspecified: Secondary | ICD-10-CM | POA: Diagnosis not present

## 2017-10-21 DIAGNOSIS — Z7189 Other specified counseling: Secondary | ICD-10-CM | POA: Diagnosis not present

## 2017-10-27 ENCOUNTER — Ambulatory Visit (INDEPENDENT_AMBULATORY_CARE_PROVIDER_SITE_OTHER): Payer: Medicare HMO | Admitting: *Deleted

## 2017-10-27 ENCOUNTER — Telehealth: Payer: Self-pay | Admitting: Cardiology

## 2017-10-27 DIAGNOSIS — I495 Sick sinus syndrome: Secondary | ICD-10-CM | POA: Diagnosis not present

## 2017-10-27 NOTE — Telephone Encounter (Signed)
Spoke with pt and reminded pt of remote transmission that is due today. Pt verbalized understanding.   

## 2017-10-28 NOTE — Progress Notes (Signed)
Remote pacemaker transmission.   

## 2017-10-29 ENCOUNTER — Other Ambulatory Visit: Payer: Self-pay | Admitting: Cardiology

## 2017-10-29 LAB — CUP PACEART REMOTE DEVICE CHECK
Battery Impedance: 658 Ohm
Battery Remaining Longevity: 87 mo
Battery Voltage: 2.79 V
Brady Statistic AP VP Percent: 0 %
Brady Statistic AP VS Percent: 71 %
Brady Statistic AS VP Percent: 0 %
Brady Statistic AS VS Percent: 29 %
Date Time Interrogation Session: 20181126223954
Implantable Lead Implant Date: 20110412
Implantable Lead Implant Date: 20110412
Implantable Lead Location: 753859
Implantable Lead Location: 753860
Implantable Lead Model: 5076
Implantable Lead Model: 5076
Implantable Pulse Generator Implant Date: 20110412
Lead Channel Impedance Value: 443 Ohm
Lead Channel Impedance Value: 584 Ohm
Lead Channel Pacing Threshold Amplitude: 0.5 V
Lead Channel Pacing Threshold Amplitude: 0.875 V
Lead Channel Pacing Threshold Pulse Width: 0.4 ms
Lead Channel Pacing Threshold Pulse Width: 0.4 ms
Lead Channel Sensing Intrinsic Amplitude: 1 mV
Lead Channel Sensing Intrinsic Amplitude: 5.6 mV
Lead Channel Setting Pacing Amplitude: 2 V
Lead Channel Setting Pacing Amplitude: 2 V
Lead Channel Setting Pacing Pulse Width: 0.4 ms
Lead Channel Setting Sensing Sensitivity: 2 mV

## 2017-10-31 ENCOUNTER — Encounter: Payer: Self-pay | Admitting: Cardiology

## 2017-11-12 ENCOUNTER — Encounter: Payer: Medicare HMO | Admitting: Cardiovascular Disease

## 2017-11-13 ENCOUNTER — Ambulatory Visit (INDEPENDENT_AMBULATORY_CARE_PROVIDER_SITE_OTHER): Payer: Medicare HMO | Admitting: Pharmacist Clinician (PhC)/ Clinical Pharmacy Specialist

## 2017-11-13 ENCOUNTER — Ambulatory Visit: Payer: Medicare HMO | Admitting: Cardiovascular Disease

## 2017-11-13 ENCOUNTER — Encounter: Payer: Self-pay | Admitting: Cardiovascular Disease

## 2017-11-13 VITALS — BP 119/70 | HR 81 | Ht 66.5 in | Wt 134.0 lb

## 2017-11-13 DIAGNOSIS — Z952 Presence of prosthetic heart valve: Secondary | ICD-10-CM | POA: Diagnosis not present

## 2017-11-13 DIAGNOSIS — I7121 Aneurysm of the ascending aorta, without rupture: Secondary | ICD-10-CM

## 2017-11-13 DIAGNOSIS — Z7901 Long term (current) use of anticoagulants: Secondary | ICD-10-CM

## 2017-11-13 DIAGNOSIS — Z950 Presence of cardiac pacemaker: Secondary | ICD-10-CM | POA: Diagnosis not present

## 2017-11-13 DIAGNOSIS — I712 Thoracic aortic aneurysm, without rupture: Secondary | ICD-10-CM | POA: Diagnosis not present

## 2017-11-13 DIAGNOSIS — I452 Bifascicular block: Secondary | ICD-10-CM

## 2017-11-13 DIAGNOSIS — I442 Atrioventricular block, complete: Secondary | ICD-10-CM

## 2017-11-13 DIAGNOSIS — I471 Supraventricular tachycardia: Secondary | ICD-10-CM | POA: Diagnosis not present

## 2017-11-13 DIAGNOSIS — E7800 Pure hypercholesterolemia, unspecified: Secondary | ICD-10-CM | POA: Diagnosis not present

## 2017-11-13 DIAGNOSIS — I495 Sick sinus syndrome: Secondary | ICD-10-CM

## 2017-11-13 LAB — POCT INR: INR: 3.7

## 2017-11-13 NOTE — Patient Instructions (Signed)

## 2017-11-13 NOTE — Progress Notes (Signed)
Cardiology Office Note    Date:  11/13/2017   ID:  [name] [name], DOB 08-10-1946, MRN [id number]  PCP:  Leeroy Cha, MD  Cardiologist:   Sanda Klein, MD   Chief complaint: pacemaker and prosthetic valve check   History of Present Illness:  [name] [name] is a 71 y.o. female who is now 7 years status post Bentall repair of a small ascending aortic aneurysm at the time of aortic valve replacement with a mechanical prosthesis for bicuspid aortic valve with severe aortic stenosis. She received a dual-chamber permanent pacemaker at the time of her surgery for what appeared to be permanent AV block, but has subsequently recovered normal AV conduction. She then developed findings of sinus node dysfunction and requires atrial pacing. She had normal coronary arteries at the time of angiography preoperatively. Her last echo was in 2014.  She has not had any cardiac problems since her last appointment.  She has been steadily losing weight and is borderline underweight now.  She does not always eat 3 meals a day.  The patient specifically denies any chest pain at rest or with exertion, dyspnea at rest or with exertion, orthopnea, paroxysmal nocturnal dyspnea, syncope, palpitations, focal neurological deficits, intermittent claudication, lower extremity edema, unexplained weight gain, cough, hemoptysis or wheezing.  The patient also denies abdominal pain, nausea, vomiting, dysphagia, diarrhea, constipation, polyuria, polydipsia, dysuria, hematuria, frequency, urgency, abnormal bleeding or bruising, fever, chills, unexpected weight changes, mood swings, change in skin or hair texture, change in voice quality, auditory or visual problems, allergic reactions or rashes, new musculoskeletal complaints other than usual "aches and pains".  She has occasional postural dizziness that can happen both when she stands up and when she lies down and is very brief.  She has not had any falls and has a steady  gait.  Pacemaker interrogation shows estimated generator longevity of 7years. There has been no atrial fibrillation and no ventricular tachycardia. Has 71% atrial pacing but only 0.1% ventricular pacing. Heart rate histogram distribution is excellent.  She has not had significant episodes of atrial or ventricular arrhythmia recorded by her device.  Past Medical History:  Diagnosis Date  . Aortic stenosis 03/07/2010   replaced mechanical valve Bentall procedure\  . Atrial fibrillation ([place])   . Coronary artery disease    Dr. Avon Gully  . Dyslipidemia   . Hypertension   . Intermittent complete heart block ([place])    H/O  . Presence of permanent cardiac pacemaker   . S/P AAA repair 03/07/2010  . Systemic hypertension     Past Surgical History:  Procedure Laterality Date  . ABDOMINAL AORTIC ANEURYSM REPAIR  03/07/2010  . AORTIC VALVE REPLACEMENT  03/07/2010   mechanical valve Bentall procedure  . CARDIAC CATHETERIZATION  08/25/2009   severe AS, mild CAD  . COLONOSCOPY WITH PROPOFOL N/A 09/12/2015   Procedure: COLONOSCOPY WITH PROPOFOL;  Surgeon: Juanita Craver, MD;  Location: [facility];  Service: Endoscopy;  Laterality: N/A;  . PACEMAKER INSERTION  03/13/2010   Medtronic Adapta  . US ECHOCARDIOGRAPHY  08/09/2011   mod. concentric LVH,LA mildly dilated,ca+ MV,trace TR and AI,mechanical AOV    Current Medications: Outpatient Medications Prior to Visit  Medication Sig Dispense Refill  . aspirin 81 MG chewable tablet Chew 81 mg by mouth daily.    Marland Kitchen atorvastatin (LIPITOR) 40 MG tablet TAKE 1 TABLET EVERY DAY 90 tablet 0  . Calcium Carbonate (CALCARB 600 PO) Take 1 tablet by mouth daily.    . clindamycin (CLEOCIN) 150 MG  capsule Take 3 capsules by mouth as directed. For dental procedure/cleanings    . hydrochlorothiazide (HYDRODIURIL) 25 MG tablet TAKE 1 TABLET EVERY OTHER DAY 45 tablet 0  . lisinopril (PRINIVIL,ZESTRIL) 20 MG tablet TAKE 1 TABLET EVERY MORNING 90 tablet 0  . metoprolol tartrate  (LOPRESSOR) 50 MG tablet TAKE 1 TABLET TWICE DAILY 180 tablet 0  . Multiple Vitamin (MULITIVITAMIN WITH MINERALS) TABS Take 1 tablet by mouth daily.    Marland Kitchen warfarin (COUMADIN) 2.5 MG tablet TAKE 1 TO 1 AND 1/2 TABLET(S) BY MOUTH DAILY AS DIRECTED BY COUMADIN CLINIC 135 tablet 1   No facility-administered medications prior to visit.      Allergies:   Crestor [rosuvastatin calcium]; Latex; and Penicillins   Social History   Socioeconomic History  . Marital status: Widowed    Spouse name: None  . Number of children: None  . Years of education: None  . Highest education level: None  Social Needs  . Financial resource strain: None  . Food insecurity - worry: None  . Food insecurity - inability: None  . Transportation needs - medical: None  . Transportation needs - non-medical: None  Occupational History  . None  Tobacco Use  . Smoking status: Former Research scientist (life sciences)  . Smokeless tobacco: Never Used  Substance and Sexual Activity  . Alcohol use: No  . Drug use: No  . Sexual activity: No  Other Topics Concern  . None  Social History Narrative  . None     Family History:  The patient's family history includes Diabetes in her brother and mother; Heart failure in her brother; Hypertension in her brother and mother.   ROS:   Please see the history of present illness.    ROS All other systems reviewed and are negative.   PHYSICAL EXAM:   VS:  BP 119/70   Pulse 81   Ht 5' 6.5" (1.689 m)   Wt 134 lb (60.8 kg)   BMI 21.30 kg/m     General: Alert, oriented x3, no distress, very slender Head: no evidence of trauma, PERRL, EOMI, no exophtalmos or lid lag, no myxedema, no xanthelasma; normal ears, nose and oropharynx Neck: normal jugular venous pulsations and no hepatojugular reflux; brisk carotid pulses without delay and no carotid bruits Chest: clear to auscultation, no signs of consolidation by percussion or palpation, normal fremitus, symmetrical and full respiratory excursions, healthy  left subclavian pacemaker site, prominent due to weight loss Cardiovascular: normal position and quality of the apical impulse, regular rhythm, n crisp prosthetic valve clicks, no murmurs, rubs or gallops Abdomen: no tenderness or distention, no masses by palpation, no abnormal pulsatility or arterial bruits, normal bowel sounds, no hepatosplenomegaly Extremities: no clubbing, cyanosis or edema; 2+ radial, ulnar and brachial pulses bilaterally; 2+ right femoral, posterior tibial and dorsalis pedis pulses; 2+ left femoral, posterior tibial and dorsalis pedis pulses; no subclavian or femoral bruits Neurological: grossly nonfocal Psych: Normal mood and affect   Wt Readings from Last 3 Encounters:  11/13/17 134 lb (60.8 kg)  10/22/16 136 lb 12.8 oz (62.1 kg)  10/19/15 131 lb (59.4 kg)      Studies/Labs Reviewed:   EKG:  EKG is ordered today.  The ekg ordered today demonstrates atrial paced ventricular sensed rhythm with long AV delay 296 ms, right bundle branch block and left anterior fascicular block, QTC 485 ms  Recent Labs: Labs from October 21, 2017 at [employer] Normal LFTs, BUN 19, creatinine 0.9 by, potassium 3.6, hemoglobin 11.5 Total cholesterol  179, HDL 79, LDL 86, triglycerides 68 glucose 92   ASSESSMENT:    1. SSS (sick sinus syndrome) ([place])   2. Bifascicular block   3. Pacemaker   4. History of mechanical aortic valve replacement   5. Ascending aortic aneurysm ([place])   6. Hypercholesterolemia   7. Paroxysmal atrial tachycardia ([facility])      PLAN:  In order of problems listed above:  1. SSS: Heart rate histogram shows appropriate sensor settings 2. Bifascicular block: She had transient complete heart block after aortic valve replacement surgery, but with current MVP settings does not require ventricular pacing. 3. PPM: Normal device function. Remote downloads every 3 months, office visit yearly 4. AVR: Echocardiogram performed in 2017 showed normal prosthetic  valve function with a mean gradient of only 9 mmHg 5. Ao Aneurysm s/p repair: related to bicuspid valve.  Normal aortic root size on echo. 6. HLP: All lipid parameters within desirable range on recent assay. 7. PAT: Brief and rare, none recorded on her pacemaker since September. 8. Underweight: Encouraged her to make sure she gets 3 meals a day, focusing on highly nutritious food rich in protein.  Medication Adjustments/Labs and Tests Ordered: Current medicines are reviewed at length with the patient today.  Concerns regarding medicines are outlined above.  Medication changes, Labs and Tests ordered today are listed in the Patient Instructions below. Patient Instructions  Dr Sallyanne Kuster recommends that you continue on your current medications as directed. Please refer to the Current Medication list given to you today.  Remote monitoring is used to monitor your Pacemaker or ICD from home. This monitoring reduces the number of office visits required to check your device to one time per year. It allows [place] to keep an eye on the functioning of your device to ensure it is working properly. You are scheduled for a device check from home on Monday, February 25th, 2019. You may send your transmission at any time that day. If you have a wireless device, the transmission will be sent automatically. After your physician reviews your transmission, you will receive a notification with your next transmission date.  Dr Sallyanne Kuster recommends that you schedule a follow-up appointment in 12 months with a pacemaker check. You will receive a reminder letter in the mail two months in advance. If you don't receive a letter, please call our office to schedule the follow-up appointment.  If you need a refill on your cardiac medications before your next appointment, please call your pharmacy.    Signed, Sanda Klein, MD  11/13/2017 11:57 AM    [facility] [place], [place], [place]   [address] Phone: [phone]; Fax: [phone]

## 2017-11-19 LAB — CUP PACEART INCLINIC DEVICE CHECK
Battery Impedance: 682 Ohm
Battery Remaining Longevity: 85 mo
Battery Voltage: 2.79 V
Brady Statistic AP VP Percent: 0 %
Brady Statistic AP VS Percent: 71 %
Brady Statistic AS VP Percent: 0 %
Brady Statistic AS VS Percent: 29 %
Date Time Interrogation Session: 20181213162617
Implantable Lead Implant Date: 20110412
Implantable Lead Implant Date: 20110412
Implantable Lead Location: 753859
Implantable Lead Location: 753860
Implantable Lead Model: 5076
Implantable Lead Model: 5076
Implantable Pulse Generator Implant Date: 20110412
Lead Channel Impedance Value: 443 Ohm
Lead Channel Impedance Value: 596 Ohm
Lead Channel Pacing Threshold Amplitude: 0.5 V
Lead Channel Pacing Threshold Amplitude: 0.875 V
Lead Channel Pacing Threshold Pulse Width: 0.4 ms
Lead Channel Pacing Threshold Pulse Width: 0.4 ms
Lead Channel Setting Pacing Amplitude: 2 V
Lead Channel Setting Pacing Amplitude: 2 V
Lead Channel Setting Pacing Pulse Width: 0.4 ms
Lead Channel Setting Sensing Sensitivity: 2 mV

## 2017-12-05 ENCOUNTER — Ambulatory Visit (INDEPENDENT_AMBULATORY_CARE_PROVIDER_SITE_OTHER): Payer: Medicare HMO | Admitting: Pharmacist

## 2017-12-05 DIAGNOSIS — Z7901 Long term (current) use of anticoagulants: Secondary | ICD-10-CM

## 2017-12-05 DIAGNOSIS — Z952 Presence of prosthetic heart valve: Secondary | ICD-10-CM

## 2017-12-05 LAB — POCT INR: INR: 2.3

## 2017-12-26 ENCOUNTER — Ambulatory Visit (INDEPENDENT_AMBULATORY_CARE_PROVIDER_SITE_OTHER): Payer: Medicare HMO | Admitting: Pharmacist

## 2017-12-26 DIAGNOSIS — Z7901 Long term (current) use of anticoagulants: Secondary | ICD-10-CM | POA: Diagnosis not present

## 2017-12-26 DIAGNOSIS — Z952 Presence of prosthetic heart valve: Secondary | ICD-10-CM | POA: Diagnosis not present

## 2017-12-26 LAB — POCT INR: INR: 2.9

## 2018-01-01 ENCOUNTER — Other Ambulatory Visit: Payer: Self-pay | Admitting: Cardiovascular Disease

## 2018-01-12 ENCOUNTER — Other Ambulatory Visit: Payer: Self-pay | Admitting: Cardiovascular Disease

## 2018-01-26 ENCOUNTER — Ambulatory Visit (INDEPENDENT_AMBULATORY_CARE_PROVIDER_SITE_OTHER): Payer: Medicare HMO | Admitting: Pharmacist Clinician (PhC)/ Clinical Pharmacy Specialist

## 2018-01-26 ENCOUNTER — Telehealth: Payer: Self-pay | Admitting: Cardiology

## 2018-01-26 ENCOUNTER — Ambulatory Visit (INDEPENDENT_AMBULATORY_CARE_PROVIDER_SITE_OTHER): Payer: Medicare HMO | Admitting: *Deleted

## 2018-01-26 DIAGNOSIS — Z7901 Long term (current) use of anticoagulants: Secondary | ICD-10-CM

## 2018-01-26 DIAGNOSIS — Z952 Presence of prosthetic heart valve: Secondary | ICD-10-CM | POA: Diagnosis not present

## 2018-01-26 DIAGNOSIS — I495 Sick sinus syndrome: Secondary | ICD-10-CM | POA: Diagnosis not present

## 2018-01-26 LAB — POCT INR: INR: 2.4

## 2018-01-26 NOTE — Telephone Encounter (Signed)
LMOVM reminding pt to send remote transmission.   

## 2018-01-26 NOTE — Patient Instructions (Signed)
Description   Take 1.5 tablets today Monday Feb 25, then continue with 1 tablet each Monday, Wednesday and Friday, 1.5 tablets all other days.  Repeat INR in 4 weeks

## 2018-01-27 NOTE — Progress Notes (Signed)
Remote pacemaker transmission.   

## 2018-01-29 ENCOUNTER — Encounter: Payer: Self-pay | Admitting: Cardiology

## 2018-01-29 NOTE — Progress Notes (Signed)
Letter  

## 2018-02-10 LAB — CUP PACEART REMOTE DEVICE CHECK
Battery Impedance: 707 Ohm
Battery Remaining Longevity: 83 mo
Battery Voltage: 2.78 V
Brady Statistic AP VP Percent: 0 %
Brady Statistic AP VS Percent: 72 %
Brady Statistic AS VP Percent: 0 %
Brady Statistic AS VS Percent: 28 %
Date Time Interrogation Session: 20190226003659
Implantable Lead Implant Date: 20110412
Implantable Lead Implant Date: 20110412
Implantable Lead Location: 753859
Implantable Lead Location: 753860
Implantable Lead Model: 5076
Implantable Lead Model: 5076
Implantable Pulse Generator Implant Date: 20110412
Lead Channel Impedance Value: 437 Ohm
Lead Channel Impedance Value: 582 Ohm
Lead Channel Pacing Threshold Amplitude: 0.5 V
Lead Channel Pacing Threshold Amplitude: 0.875 V
Lead Channel Pacing Threshold Pulse Width: 0.4 ms
Lead Channel Pacing Threshold Pulse Width: 0.4 ms
Lead Channel Setting Pacing Amplitude: 2 V
Lead Channel Setting Pacing Amplitude: 2 V
Lead Channel Setting Pacing Pulse Width: 0.4 ms
Lead Channel Setting Sensing Sensitivity: 2 mV

## 2018-02-16 ENCOUNTER — Ambulatory Visit (INDEPENDENT_AMBULATORY_CARE_PROVIDER_SITE_OTHER): Payer: Medicare HMO | Admitting: Pharmacist

## 2018-02-16 DIAGNOSIS — Z952 Presence of prosthetic heart valve: Secondary | ICD-10-CM

## 2018-02-16 DIAGNOSIS — Z7901 Long term (current) use of anticoagulants: Secondary | ICD-10-CM | POA: Diagnosis not present

## 2018-02-16 LAB — POCT INR: INR: 3

## 2018-03-05 ENCOUNTER — Other Ambulatory Visit: Payer: Self-pay | Admitting: Cardiovascular Disease

## 2018-03-16 ENCOUNTER — Ambulatory Visit (INDEPENDENT_AMBULATORY_CARE_PROVIDER_SITE_OTHER): Payer: Medicare HMO | Admitting: Pharmacist Clinician (PhC)/ Clinical Pharmacy Specialist

## 2018-03-16 DIAGNOSIS — Z7901 Long term (current) use of anticoagulants: Secondary | ICD-10-CM | POA: Diagnosis not present

## 2018-03-16 DIAGNOSIS — Z952 Presence of prosthetic heart valve: Secondary | ICD-10-CM | POA: Diagnosis not present

## 2018-03-16 LAB — POCT INR: INR: 3.9

## 2018-03-16 NOTE — Patient Instructions (Signed)
Description   No warfarin today Monday April 15, then continue with 1 tablet each Monday, Wednesday and Friday, 1.5 tablets all other days. Repeat INR in 3 weeks.

## 2018-03-24 ENCOUNTER — Other Ambulatory Visit: Payer: Self-pay | Admitting: Internal Medicine

## 2018-03-24 DIAGNOSIS — Z1231 Encounter for screening mammogram for malignant neoplasm of breast: Secondary | ICD-10-CM

## 2018-04-06 ENCOUNTER — Ambulatory Visit (INDEPENDENT_AMBULATORY_CARE_PROVIDER_SITE_OTHER): Payer: Medicare HMO | Admitting: Pharmacist Clinician (PhC)/ Clinical Pharmacy Specialist

## 2018-04-06 DIAGNOSIS — I471 Supraventricular tachycardia: Secondary | ICD-10-CM | POA: Diagnosis not present

## 2018-04-06 DIAGNOSIS — Z7901 Long term (current) use of anticoagulants: Secondary | ICD-10-CM

## 2018-04-06 DIAGNOSIS — Z952 Presence of prosthetic heart valve: Secondary | ICD-10-CM

## 2018-04-06 LAB — POCT INR: INR: 3.6

## 2018-04-06 NOTE — Patient Instructions (Signed)
Description   Decrease dose to 1 tablet  Daily except 1.5 tablets each Sunday, Tuesday and Thursday. Repeat INR in 3 weeks.

## 2018-04-09 DIAGNOSIS — H04123 Dry eye syndrome of bilateral lacrimal glands: Secondary | ICD-10-CM | POA: Diagnosis not present

## 2018-04-09 DIAGNOSIS — H34231 Retinal artery branch occlusion, right eye: Secondary | ICD-10-CM | POA: Diagnosis not present

## 2018-04-09 DIAGNOSIS — H2513 Age-related nuclear cataract, bilateral: Secondary | ICD-10-CM | POA: Diagnosis not present

## 2018-04-20 ENCOUNTER — Ambulatory Visit
Admission: RE | Admit: 2018-04-20 | Discharge: 2018-04-20 | Disposition: A | Discharge status: Home / Self Care | Payer: Medicare HMO | Source: Ambulatory Visit | Attending: Internal Medicine | Admitting: Internal Medicine

## 2018-04-20 DIAGNOSIS — Z1231 Encounter for screening mammogram for malignant neoplasm of breast: Secondary | ICD-10-CM | POA: Diagnosis not present

## 2018-04-22 DIAGNOSIS — I10 Essential (primary) hypertension: Secondary | ICD-10-CM | POA: Diagnosis not present

## 2018-04-22 DIAGNOSIS — Z7901 Long term (current) use of anticoagulants: Secondary | ICD-10-CM | POA: Diagnosis not present

## 2018-04-22 DIAGNOSIS — Z1159 Encounter for screening for other viral diseases: Secondary | ICD-10-CM | POA: Diagnosis not present

## 2018-04-22 DIAGNOSIS — E785 Hyperlipidemia, unspecified: Secondary | ICD-10-CM | POA: Diagnosis not present

## 2018-04-22 DIAGNOSIS — K219 Gastro-esophageal reflux disease without esophagitis: Secondary | ICD-10-CM | POA: Diagnosis not present

## 2018-04-28 ENCOUNTER — Ambulatory Visit (INDEPENDENT_AMBULATORY_CARE_PROVIDER_SITE_OTHER): Payer: Medicare HMO | Admitting: Pharmacist Clinician (PhC)/ Clinical Pharmacy Specialist

## 2018-04-28 ENCOUNTER — Ambulatory Visit (INDEPENDENT_AMBULATORY_CARE_PROVIDER_SITE_OTHER): Payer: Medicare HMO | Admitting: *Deleted

## 2018-04-28 DIAGNOSIS — Z7901 Long term (current) use of anticoagulants: Secondary | ICD-10-CM | POA: Diagnosis not present

## 2018-04-28 DIAGNOSIS — Z952 Presence of prosthetic heart valve: Secondary | ICD-10-CM

## 2018-04-28 DIAGNOSIS — I495 Sick sinus syndrome: Secondary | ICD-10-CM | POA: Diagnosis not present

## 2018-04-28 LAB — POCT INR: INR: 2.1 (ref 2.0–3.0)

## 2018-04-28 NOTE — Patient Instructions (Signed)
Description   Increase dose to 1 tablet each Monday, Wednesday and Friday, 1.5 tablets all other days.  Repeat INR in 2 weeks     

## 2018-04-29 NOTE — Progress Notes (Signed)
Remote pacemaker transmission.   

## 2018-04-30 LAB — CUP PACEART REMOTE DEVICE CHECK
Battery Impedance: 807 Ohm
Battery Remaining Longevity: 78 mo
Battery Voltage: 2.78 V
Brady Statistic AP VP Percent: 0 %
Brady Statistic AP VS Percent: 72 %
Brady Statistic AS VP Percent: 0 %
Brady Statistic AS VS Percent: 28 %
Date Time Interrogation Session: 20190528235407
Implantable Lead Implant Date: 20110412
Implantable Lead Implant Date: 20110412
Implantable Lead Location: 753859
Implantable Lead Location: 753860
Implantable Lead Model: 5076
Implantable Lead Model: 5076
Implantable Pulse Generator Implant Date: 20110412
Lead Channel Impedance Value: 431 Ohm
Lead Channel Impedance Value: 565 Ohm
Lead Channel Pacing Threshold Amplitude: 0.5 V
Lead Channel Pacing Threshold Amplitude: 1 V
Lead Channel Pacing Threshold Pulse Width: 0.4 ms
Lead Channel Pacing Threshold Pulse Width: 0.4 ms
Lead Channel Setting Pacing Amplitude: 2 V
Lead Channel Setting Pacing Amplitude: 2 V
Lead Channel Setting Pacing Pulse Width: 0.4 ms
Lead Channel Setting Sensing Sensitivity: 2 mV

## 2018-05-01 ENCOUNTER — Encounter: Payer: Self-pay | Admitting: Cardiology

## 2018-05-12 ENCOUNTER — Ambulatory Visit (INDEPENDENT_AMBULATORY_CARE_PROVIDER_SITE_OTHER): Payer: Medicare HMO | Admitting: Pharmacist

## 2018-05-12 ENCOUNTER — Other Ambulatory Visit: Payer: Self-pay | Admitting: Cardiovascular Disease

## 2018-05-12 DIAGNOSIS — Z952 Presence of prosthetic heart valve: Secondary | ICD-10-CM

## 2018-05-12 DIAGNOSIS — Z7901 Long term (current) use of anticoagulants: Secondary | ICD-10-CM

## 2018-05-12 LAB — POCT INR: INR: 2.7 (ref 2.0–3.0)

## 2018-06-02 ENCOUNTER — Other Ambulatory Visit: Payer: Self-pay | Admitting: Cardiovascular Disease

## 2018-06-15 ENCOUNTER — Ambulatory Visit (INDEPENDENT_AMBULATORY_CARE_PROVIDER_SITE_OTHER): Payer: Medicare HMO | Admitting: Pharmacist

## 2018-06-15 DIAGNOSIS — Z952 Presence of prosthetic heart valve: Secondary | ICD-10-CM | POA: Diagnosis not present

## 2018-06-15 DIAGNOSIS — Z7901 Long term (current) use of anticoagulants: Secondary | ICD-10-CM | POA: Diagnosis not present

## 2018-06-15 LAB — POCT INR: INR: 3.4 — AB (ref 2.0–3.0)

## 2018-07-06 DIAGNOSIS — H2512 Age-related nuclear cataract, left eye: Secondary | ICD-10-CM | POA: Diagnosis not present

## 2018-07-13 ENCOUNTER — Ambulatory Visit (INDEPENDENT_AMBULATORY_CARE_PROVIDER_SITE_OTHER): Payer: Medicare HMO | Admitting: Pharmacist

## 2018-07-13 DIAGNOSIS — Z952 Presence of prosthetic heart valve: Secondary | ICD-10-CM

## 2018-07-13 DIAGNOSIS — Z7901 Long term (current) use of anticoagulants: Secondary | ICD-10-CM | POA: Diagnosis not present

## 2018-07-13 LAB — POCT INR: INR: 3 (ref 2.0–3.0)

## 2018-07-15 ENCOUNTER — Other Ambulatory Visit: Payer: Self-pay | Admitting: Cardiovascular Disease

## 2018-07-15 NOTE — Telephone Encounter (Signed)
Rx request sent to pharmacy.  

## 2018-07-20 ENCOUNTER — Other Ambulatory Visit: Payer: Self-pay | Admitting: Cardiovascular Disease

## 2018-07-20 NOTE — Telephone Encounter (Signed)
Rx sent to pharmacy   

## 2018-07-28 ENCOUNTER — Ambulatory Visit (INDEPENDENT_AMBULATORY_CARE_PROVIDER_SITE_OTHER): Payer: Medicare HMO | Admitting: *Deleted

## 2018-07-28 ENCOUNTER — Telehealth: Payer: Self-pay | Admitting: Cardiology

## 2018-07-28 DIAGNOSIS — I495 Sick sinus syndrome: Secondary | ICD-10-CM | POA: Diagnosis not present

## 2018-07-28 NOTE — Telephone Encounter (Signed)
Spoke with pt and reminded pt of remote transmission that is due today. Pt verbalized understanding.   

## 2018-07-29 NOTE — Progress Notes (Signed)
Remote pacemaker transmission.   

## 2018-07-30 ENCOUNTER — Encounter: Payer: Self-pay | Admitting: Cardiology

## 2018-08-10 ENCOUNTER — Ambulatory Visit (INDEPENDENT_AMBULATORY_CARE_PROVIDER_SITE_OTHER): Payer: Medicare HMO | Admitting: Pharmacist Clinician (PhC)/ Clinical Pharmacy Specialist

## 2018-08-10 DIAGNOSIS — Z952 Presence of prosthetic heart valve: Secondary | ICD-10-CM | POA: Diagnosis not present

## 2018-08-10 DIAGNOSIS — Z7901 Long term (current) use of anticoagulants: Secondary | ICD-10-CM | POA: Diagnosis not present

## 2018-08-10 LAB — POCT INR: INR: 2.3 (ref 2.0–3.0)

## 2018-08-10 NOTE — Patient Instructions (Signed)
Description   Take 1 tablet each Monday, Wednesday and Friday, 1.5 tablets all other days.  Repeat INR in 4 weeks

## 2018-08-18 LAB — CUP PACEART REMOTE DEVICE CHECK
Battery Impedance: 882 Ohm
Battery Remaining Longevity: 74 mo
Battery Voltage: 2.78 V
Brady Statistic AP VP Percent: 0 %
Brady Statistic AP VS Percent: 72 %
Brady Statistic AS VP Percent: 0 %
Brady Statistic AS VS Percent: 28 %
Date Time Interrogation Session: 20190827223350
Implantable Lead Implant Date: 20110412
Implantable Lead Implant Date: 20110412
Implantable Lead Location: 753859
Implantable Lead Location: 753860
Implantable Lead Model: 5076
Implantable Lead Model: 5076
Implantable Pulse Generator Implant Date: 20110412
Lead Channel Impedance Value: 449 Ohm
Lead Channel Impedance Value: 605 Ohm
Lead Channel Pacing Threshold Amplitude: 0.5 V
Lead Channel Pacing Threshold Amplitude: 0.875 V
Lead Channel Pacing Threshold Pulse Width: 0.4 ms
Lead Channel Pacing Threshold Pulse Width: 0.4 ms
Lead Channel Setting Pacing Amplitude: 2 V
Lead Channel Setting Pacing Amplitude: 2 V
Lead Channel Setting Pacing Pulse Width: 0.4 ms
Lead Channel Setting Sensing Sensitivity: 2 mV

## 2018-08-31 ENCOUNTER — Ambulatory Visit (INDEPENDENT_AMBULATORY_CARE_PROVIDER_SITE_OTHER): Payer: Medicare HMO | Admitting: Pharmacist Clinician (PhC)/ Clinical Pharmacy Specialist

## 2018-08-31 DIAGNOSIS — Z952 Presence of prosthetic heart valve: Secondary | ICD-10-CM | POA: Diagnosis not present

## 2018-08-31 DIAGNOSIS — Z7901 Long term (current) use of anticoagulants: Secondary | ICD-10-CM

## 2018-08-31 LAB — POCT INR: INR: 2.9 (ref 2.0–3.0)

## 2018-09-24 ENCOUNTER — Other Ambulatory Visit: Payer: Self-pay | Admitting: Cardiovascular Disease

## 2018-09-28 ENCOUNTER — Ambulatory Visit (INDEPENDENT_AMBULATORY_CARE_PROVIDER_SITE_OTHER): Payer: Medicare HMO | Admitting: Pharmacist

## 2018-09-28 DIAGNOSIS — Z7901 Long term (current) use of anticoagulants: Secondary | ICD-10-CM

## 2018-09-28 DIAGNOSIS — Z952 Presence of prosthetic heart valve: Secondary | ICD-10-CM | POA: Diagnosis not present

## 2018-09-28 LAB — POCT INR: INR: 3.8 — AB (ref 2.0–3.0)

## 2018-10-19 ENCOUNTER — Ambulatory Visit (INDEPENDENT_AMBULATORY_CARE_PROVIDER_SITE_OTHER): Payer: Medicare HMO | Admitting: Pharmacist

## 2018-10-19 DIAGNOSIS — Z952 Presence of prosthetic heart valve: Secondary | ICD-10-CM

## 2018-10-19 DIAGNOSIS — Z7901 Long term (current) use of anticoagulants: Secondary | ICD-10-CM | POA: Diagnosis not present

## 2018-10-19 LAB — POCT INR: INR: 3.3 — AB (ref 2.0–3.0)

## 2018-10-22 ENCOUNTER — Other Ambulatory Visit: Payer: Self-pay | Admitting: Cardiovascular Disease

## 2018-10-26 DIAGNOSIS — E785 Hyperlipidemia, unspecified: Secondary | ICD-10-CM | POA: Diagnosis not present

## 2018-10-26 DIAGNOSIS — N029 Recurrent and persistent hematuria with unspecified morphologic changes: Secondary | ICD-10-CM | POA: Diagnosis not present

## 2018-10-26 DIAGNOSIS — Z780 Asymptomatic menopausal state: Secondary | ICD-10-CM | POA: Diagnosis not present

## 2018-10-26 DIAGNOSIS — Z1159 Encounter for screening for other viral diseases: Secondary | ICD-10-CM | POA: Diagnosis not present

## 2018-10-26 DIAGNOSIS — I10 Essential (primary) hypertension: Secondary | ICD-10-CM | POA: Diagnosis not present

## 2018-10-26 DIAGNOSIS — I712 Thoracic aortic aneurysm, without rupture: Secondary | ICD-10-CM | POA: Diagnosis not present

## 2018-10-26 DIAGNOSIS — Z1389 Encounter for screening for other disorder: Secondary | ICD-10-CM | POA: Diagnosis not present

## 2018-10-26 DIAGNOSIS — Z23 Encounter for immunization: Secondary | ICD-10-CM | POA: Diagnosis not present

## 2018-10-26 DIAGNOSIS — Z0000 Encounter for general adult medical examination without abnormal findings: Secondary | ICD-10-CM | POA: Diagnosis not present

## 2018-10-26 DIAGNOSIS — I471 Supraventricular tachycardia: Secondary | ICD-10-CM | POA: Diagnosis not present

## 2018-10-27 ENCOUNTER — Ambulatory Visit (INDEPENDENT_AMBULATORY_CARE_PROVIDER_SITE_OTHER): Payer: Medicare HMO

## 2018-10-27 ENCOUNTER — Telehealth: Payer: Self-pay | Admitting: Cardiology

## 2018-10-27 DIAGNOSIS — I495 Sick sinus syndrome: Secondary | ICD-10-CM | POA: Diagnosis not present

## 2018-10-27 NOTE — Telephone Encounter (Signed)
Spoke with pt and reminded pt of remote transmission that is due today. Pt verbalized understanding.   

## 2018-10-28 ENCOUNTER — Encounter: Payer: Self-pay | Admitting: Cardiology

## 2018-10-28 NOTE — Progress Notes (Signed)
Remote pacemaker transmission.   

## 2018-11-04 DIAGNOSIS — H26492 Other secondary cataract, left eye: Secondary | ICD-10-CM | POA: Diagnosis not present

## 2018-11-04 DIAGNOSIS — H04123 Dry eye syndrome of bilateral lacrimal glands: Secondary | ICD-10-CM | POA: Diagnosis not present

## 2018-11-04 DIAGNOSIS — H34231 Retinal artery branch occlusion, right eye: Secondary | ICD-10-CM | POA: Diagnosis not present

## 2018-11-04 DIAGNOSIS — H2511 Age-related nuclear cataract, right eye: Secondary | ICD-10-CM | POA: Diagnosis not present

## 2018-11-04 DIAGNOSIS — Z961 Presence of intraocular lens: Secondary | ICD-10-CM | POA: Diagnosis not present

## 2018-11-06 ENCOUNTER — Ambulatory Visit (INDEPENDENT_AMBULATORY_CARE_PROVIDER_SITE_OTHER): Payer: Medicare HMO | Admitting: Cardiovascular Disease

## 2018-11-06 ENCOUNTER — Encounter: Payer: Self-pay | Admitting: Cardiovascular Disease

## 2018-11-06 ENCOUNTER — Ambulatory Visit (INDEPENDENT_AMBULATORY_CARE_PROVIDER_SITE_OTHER): Payer: Medicare HMO | Admitting: Pharmacist Clinician (PhC)/ Clinical Pharmacy Specialist

## 2018-11-06 VITALS — BP 132/72 | HR 89 | Ht 66.0 in | Wt 135.6 lb

## 2018-11-06 DIAGNOSIS — Z950 Presence of cardiac pacemaker: Secondary | ICD-10-CM

## 2018-11-06 DIAGNOSIS — Z8679 Personal history of other diseases of the circulatory system: Secondary | ICD-10-CM | POA: Diagnosis not present

## 2018-11-06 DIAGNOSIS — Z952 Presence of prosthetic heart valve: Secondary | ICD-10-CM

## 2018-11-06 DIAGNOSIS — I471 Supraventricular tachycardia: Secondary | ICD-10-CM

## 2018-11-06 DIAGNOSIS — I452 Bifascicular block: Secondary | ICD-10-CM | POA: Diagnosis not present

## 2018-11-06 DIAGNOSIS — Z7901 Long term (current) use of anticoagulants: Secondary | ICD-10-CM | POA: Diagnosis not present

## 2018-11-06 DIAGNOSIS — I495 Sick sinus syndrome: Secondary | ICD-10-CM | POA: Insufficient documentation

## 2018-11-06 DIAGNOSIS — Z9889 Other specified postprocedural states: Secondary | ICD-10-CM

## 2018-11-06 DIAGNOSIS — E7800 Pure hypercholesterolemia, unspecified: Secondary | ICD-10-CM

## 2018-11-06 LAB — POCT INR: INR: 3.5 — AB (ref 2.0–3.0)

## 2018-11-06 NOTE — Progress Notes (Signed)
Cardiology Office Note    Date:  11/06/2018   ID:  [name] [name], DOB [birth year], MRN [id number]  PCP:  Leeroy Cha, MD  Cardiologist:   Sanda Klein, MD   Chief complaint: pacemaker and prosthetic valve check   History of Present Illness:  [name] [name] is a 72 y.o. female who is now 8 years status post Bentall repair of a small ascending aortic aneurysm at the time of aortic valve replacement with a mechanical prosthesis for bicuspid aortic valve with severe aortic stenosis. She received a dual-chamber permanent pacemaker (Medtronic Adapta dual-chamber) at the time of her surgery for what appeared to be permanent AV block, but has subsequently recovered normal AV conduction. She then developed findings of sinus node dysfunction and requires atrial pacing. She had normal coronary arteries at the time of angiography preoperatively. Her last echo was in 2017.  She feels well and has no cardiovascular complaints. The patient specifically denies any chest pain at rest exertion, dyspnea at rest or with exertion, orthopnea, paroxysmal nocturnal dyspnea, syncope, palpitations, focal neurological deficits, intermittent claudication, lower extremity edema, unexplained weight gain, cough, hemoptysis or wheezing. Her weight loss has abated and she is actually gained back a little.  She is no longer officially underweight.  Pacemaker interrogation shows estimated generator longevity of 6 years. There has been no atrial fibrillation and no ventricular tachycardia. Has 72 % atrial pacing but only 0.1 % ventricular pacing. Heart rate histogram distribution is excellent.  She has very rare episodes of brief atrial tachycardia, the longest being 11 seconds.  Both leads are Medtronic 5076 leads and have excellent parameters.  Past Medical History:  Diagnosis Date  . Aortic stenosis 03/07/2010   replaced mechanical valve Bentall procedure\  . Atrial fibrillation ([place])   . Coronary artery disease    Dr. Avon Gully  . Dyslipidemia   . Hypertension   . Intermittent complete heart block ([place])    H/O  . Presence of permanent cardiac pacemaker   . S/P AAA repair 03/07/2010  . Systemic hypertension     Past Surgical History:  Procedure Laterality Date  . ABDOMINAL AORTIC ANEURYSM REPAIR  03/07/2010  . AORTIC VALVE REPLACEMENT  03/07/2010   mechanical valve Bentall procedure  . CARDIAC CATHETERIZATION  08/25/2009   severe AS, mild CAD  . COLONOSCOPY WITH PROPOFOL N/A 09/12/2015   Procedure: COLONOSCOPY WITH PROPOFOL;  Surgeon: Juanita Craver, MD;  Location: [facility];  Service: Endoscopy;  Laterality: N/A;  . PACEMAKER INSERTION  03/13/2010   Medtronic Adapta  . US ECHOCARDIOGRAPHY  08/09/2011   mod. concentric LVH,LA mildly dilated,ca+ MV,trace TR and AI,mechanical AOV    Current Medications: Outpatient Medications Prior to Visit  Medication Sig Dispense Refill  . aspirin 81 MG chewable tablet Chew 81 mg by mouth daily.    Marland Kitchen atorvastatin (LIPITOR) 40 MG tablet TAKE 1 TABLET EVERY DAY 90 tablet 0  . Calcium Carbonate (CALCARB 600 PO) Take 1 tablet by mouth daily.    . clindamycin (CLEOCIN) 150 MG capsule Take 3 capsules by mouth as directed. For dental procedure/cleanings    . hydrochlorothiazide (HYDRODIURIL) 25 MG tablet TAKE 1 TABLET EVERY OTHER DAY 45 tablet 0  . lisinopril (PRINIVIL,ZESTRIL) 20 MG tablet TAKE 1 TABLET EVERY MORNING NEED MD APPOINTMENT FOR REFILLS 90 tablet 1  . metoprolol tartrate (LOPRESSOR) 50 MG tablet TAKE 1 TABLET TWICE DAILY 180 tablet 0  . Multiple Vitamin (MULITIVITAMIN WITH MINERALS) TABS Take 1 tablet by mouth daily.    Marland Kitchen warfarin (  COUMADIN) 2.5 MG tablet TAKE 1 TO 1 AND 1/2 TABLETS DAILY AS DIRECTED BY COUMADIN CLINIC 135 tablet 0   No facility-administered medications prior to visit.      Allergies:   Crestor [rosuvastatin calcium]; Latex; and Penicillins   Social History   Socioeconomic History  . Marital status: Widowed    Spouse name: Not on file    . Number of children: Not on file  . Years of education: Not on file  . Highest education level: Not on file  Occupational History  . Not on file  Social Needs  . Financial resource strain: Not on file  . Food insecurity:    Worry: Not on file    Inability: Not on file  . Transportation needs:    Medical: Not on file    Non-medical: Not on file  Tobacco Use  . Smoking status: Former Research scientist (life sciences)  . Smokeless tobacco: Never Used  Substance and Sexual Activity  . Alcohol use: No  . Drug use: No  . Sexual activity: Never  Lifestyle  . Physical activity:    Days per week: Not on file    Minutes per session: Not on file  . Stress: Not on file  Relationships  . Social connections:    Talks on phone: Not on file    Gets together: Not on file    Attends religious service: Not on file    Active member of club or organization: Not on file    Attends meetings of clubs or organizations: Not on file    Relationship status: Not on file  Other Topics Concern  . Not on file  Social History Narrative  . Not on file     Family History:  The patient's family history includes Diabetes in her brother and mother; Heart failure in her brother; Hypertension in her brother and mother.   ROS:   Please see the history of present illness.    ROS all other systems are reviewed and are negative  PHYSICAL EXAM:   VS:  BP 132/72   Pulse 89   Ht 5\' 6"  (1.676 m)   Wt 135 lb 9.6 oz (61.5 kg)   BMI 21.89 kg/m      General: Alert, oriented x3, no distress,Slender.  Healthy left subclavian pacemaker site Head: no evidence of trauma, PERRL, EOMI, no exophtalmos or lid lag, no myxedema, no xanthelasma; normal ears, nose and oropharynx Neck: normal jugular venous pulsations and no hepatojugular reflux; brisk carotid pulses without delay and no carotid bruits Chest: clear to auscultation, no signs of consolidation by percussion or palpation, normal fremitus, symmetrical and full respiratory  excursions Cardiovascular: normal position and quality of the apical impulse, regular rhythm, normal first and second heart sounds with crisp prosthetic valve clicks, no murmurs, rubs or gallops Abdomen: no tenderness or distention, no masses by palpation, no abnormal pulsatility or arterial bruits, normal bowel sounds, no hepatosplenomegaly Extremities: no clubbing, cyanosis or edema; 2+ radial, ulnar and brachial pulses bilaterally; 2+ right femoral, posterior tibial and dorsalis pedis pulses; 2+ left femoral, posterior tibial and dorsalis pedis pulses; no subclavian or femoral bruits Neurological: grossly nonfocal Psych: Normal mood and affect    Wt Readings from Last 3 Encounters:  11/06/18 135 lb 9.6 oz (61.5 kg)  11/13/17 134 lb (60.8 kg)  10/22/16 136 lb 12.8 oz (62.1 kg)      Studies/Labs Reviewed:   EKG:  EKG is ordered today.  It shows sinus rhythm competing with atrial paced (  sensor driven) rhythm, native AV conduction with right bundle branch block and left anterior fascicular block.  QTc 481 ms  Recent Labs: Labs from October 21, 2017 at [facility] Normal LFTs, BUN 19, creatinine 0.9 by, potassium 3.6, hemoglobin 11.5 Total cholesterol 179, HDL 79, LDL 86, triglycerides 68 glucose 92   ASSESSMENT:    1. SSS (sick sinus syndrome) ([place])   2. Bifascicular block   3. Pacemaker   4. H/O mechanical aortic valve replacement   5. S/P ascending aortic aneurysm repair   6. Hypercholesterolemia   7. PAT (paroxysmal atrial tachycardia) ([facility])      PLAN:  In order of problems listed above:  1. SSS: Heart rate histogram distribution is appropriate. 2. Bifascicular block: She has recovered native AV conduction and almost never requires ventricular pacing. 3. PPM: Normal device function. Remote downloads every 3 months, office visit yearly 4. AVR: Echocardiogram performed in 2017 showed normal prosthetic valve function with a mean gradient of only 9 mmHg.  5. Ao  Aneurysm s/p repair: related to bicuspid valve.  Normal aortic root size on echo. 6. HLP: Lipid profile followed by PCP, recently checked we will get a copy of her records 7. PAT: Very brief and very infrequent no treatment necessary. 8. Underweight: Improved weight  Medication Adjustments/Labs and Tests Ordered: Current medicines are reviewed at length with the patient today.  Concerns regarding medicines are outlined above.  Medication changes, Labs and Tests ordered today are listed in the Patient Instructions below. Patient Instructions  Medication Instructions:  Dr Sallyanne Kuster recommends that you continue on your current medications as directed. Please refer to the Current Medication list given to you today.  If you need a refill on your cardiac medications before your next appointment, please call your pharmacy.   Testing/Procedures: Remote monitoring is used to monitor your Pacemaker of ICD from home. This monitoring reduces the number of office visits required to check your device to one time per year. It allows [place] to keep an eye on the functioning of your device to ensure it is working properly. You are scheduled for a device check from home on Tuesday, February 25th, 2020. You may send your transmission at any time that day. If you have a wireless device, the transmission will be sent automatically. After your physician reviews your transmission, you will receive a postcard with your next transmission date.  Follow-Up: At [facility], you and your health needs are our priority.  As part of our continuing mission to provide you with exceptional heart care, we have created designated Provider Care Teams.  These Care Teams include your primary Cardiologist (physician) and Advanced Practice Providers (APPs -  Physician Assistants and Nurse Practitioners) who all work together to provide you with the care you need, when you need it. You will need a follow up appointment in 12 months.  Please  call our office 2 months in advance to schedule this appointment.  You may see Sanda Klein, MD or one of the following Advanced Practice Providers on your designated Care Team: [place], [place] . Fabian Sharp, PA-C . You will receive a reminder letter in the mail two months in advance. If you don't receive a letter, please call our office to schedule the follow-up appointment.    Signed, Sanda Klein, MD  11/06/2018 3:58 PM    [address], [place], [place]  [address] Phone: [phone]; Fax: [phone]

## 2018-11-06 NOTE — Patient Instructions (Signed)
Medication Instructions:  Dr Sallyanne Kuster recommends that you continue on your current medications as directed. Please refer to the Current Medication list given to you today.  If you need a refill on your cardiac medications before your next appointment, please call your pharmacy.   Testing/Procedures: Remote monitoring is used to monitor your Pacemaker of ICD from home. This monitoring reduces the number of office visits required to check your device to one time per year. It allows [place] to keep an eye on the functioning of your device to ensure it is working properly. You are scheduled for a device check from home on Tuesday, February 25th, 2020. You may send your transmission at any time that day. If you have a wireless device, the transmission will be sent automatically. After your physician reviews your transmission, you will receive a postcard with your next transmission date.  Follow-Up: At [facility], you and your health needs are our priority.  As part of our continuing mission to provide you with exceptional heart care, we have created designated Provider Care Teams.  These Care Teams include your primary Cardiologist (physician) and Advanced Practice Providers (APPs -  Physician Assistants and Nurse Practitioners) who all work together to provide you with the care you need, when you need it. You will need a follow up appointment in 12 months.  Please call our office 2 months in advance to schedule this appointment.  You may see Sanda Klein, MD or one of the following Advanced Practice Providers on your designated Care Team: [place], [place] . Fabian Sharp, PA-C . You will receive a reminder letter in the mail two months in advance. If you don't receive a letter, please call our office to schedule the follow-up appointment.

## 2018-11-30 ENCOUNTER — Other Ambulatory Visit: Payer: Self-pay | Admitting: Cardiovascular Disease

## 2018-12-08 ENCOUNTER — Other Ambulatory Visit: Payer: Self-pay | Admitting: Cardiovascular Disease

## 2018-12-09 ENCOUNTER — Ambulatory Visit (INDEPENDENT_AMBULATORY_CARE_PROVIDER_SITE_OTHER): Payer: Medicare HMO | Admitting: Pharmacist

## 2018-12-09 DIAGNOSIS — Z7901 Long term (current) use of anticoagulants: Secondary | ICD-10-CM | POA: Diagnosis not present

## 2018-12-09 DIAGNOSIS — Z952 Presence of prosthetic heart valve: Secondary | ICD-10-CM

## 2018-12-09 LAB — POCT INR: INR: 3.6 — AB (ref 2.0–3.0)

## 2018-12-18 LAB — CUP PACEART REMOTE DEVICE CHECK
Battery Impedance: 983 Ohm
Battery Remaining Longevity: 70 mo
Battery Voltage: 2.78 V
Brady Statistic AP VP Percent: 0 %
Brady Statistic AP VS Percent: 72 %
Brady Statistic AS VP Percent: 0 %
Brady Statistic AS VS Percent: 28 %
Date Time Interrogation Session: 20191126234124
Implantable Lead Implant Date: 20110412
Implantable Lead Implant Date: 20110412
Implantable Lead Location: 753859
Implantable Lead Location: 753860
Implantable Lead Model: 5076
Implantable Lead Model: 5076
Implantable Pulse Generator Implant Date: 20110412
Lead Channel Impedance Value: 455 Ohm
Lead Channel Impedance Value: 601 Ohm
Lead Channel Pacing Threshold Amplitude: 0.375 V
Lead Channel Pacing Threshold Amplitude: 1 V
Lead Channel Pacing Threshold Pulse Width: 0.4 ms
Lead Channel Pacing Threshold Pulse Width: 0.4 ms
Lead Channel Setting Pacing Amplitude: 2 V
Lead Channel Setting Pacing Amplitude: 2 V
Lead Channel Setting Pacing Pulse Width: 0.4 ms
Lead Channel Setting Sensing Sensitivity: 2 mV

## 2018-12-22 LAB — CUP PACEART INCLINIC DEVICE CHECK
Date Time Interrogation Session: 20200121152910
Implantable Lead Implant Date: 20110412
Implantable Lead Implant Date: 20110412
Implantable Lead Location: 753859
Implantable Lead Location: 753860
Implantable Lead Model: 5076
Implantable Lead Model: 5076
Implantable Pulse Generator Implant Date: 20110412

## 2019-01-06 ENCOUNTER — Ambulatory Visit (INDEPENDENT_AMBULATORY_CARE_PROVIDER_SITE_OTHER): Payer: Medicare HMO | Admitting: Pharmacist

## 2019-01-06 DIAGNOSIS — Z952 Presence of prosthetic heart valve: Secondary | ICD-10-CM

## 2019-01-06 DIAGNOSIS — Z7901 Long term (current) use of anticoagulants: Secondary | ICD-10-CM | POA: Diagnosis not present

## 2019-01-06 LAB — POCT INR: INR: 3.3 — AB (ref 2.0–3.0)

## 2019-01-26 ENCOUNTER — Ambulatory Visit (INDEPENDENT_AMBULATORY_CARE_PROVIDER_SITE_OTHER): Payer: Medicare HMO | Admitting: *Deleted

## 2019-01-26 DIAGNOSIS — I495 Sick sinus syndrome: Secondary | ICD-10-CM

## 2019-01-27 LAB — CUP PACEART REMOTE DEVICE CHECK
Battery Impedance: 1140 Ohm
Battery Remaining Longevity: 64 mo
Battery Voltage: 2.78 V
Brady Statistic AP VP Percent: 0 %
Brady Statistic AP VS Percent: 72 %
Brady Statistic AS VP Percent: 0 %
Brady Statistic AS VS Percent: 27 %
Date Time Interrogation Session: 20200226005329
Implantable Lead Implant Date: 20110412
Implantable Lead Implant Date: 20110412
Implantable Lead Location: 753859
Implantable Lead Location: 753860
Implantable Lead Model: 5076
Implantable Lead Model: 5076
Implantable Pulse Generator Implant Date: 20110412
Lead Channel Impedance Value: 455 Ohm
Lead Channel Impedance Value: 609 Ohm
Lead Channel Pacing Threshold Amplitude: 0.5 V
Lead Channel Pacing Threshold Amplitude: 0.875 V
Lead Channel Pacing Threshold Pulse Width: 0.4 ms
Lead Channel Pacing Threshold Pulse Width: 0.4 ms
Lead Channel Setting Pacing Amplitude: 2 V
Lead Channel Setting Pacing Amplitude: 2 V
Lead Channel Setting Pacing Pulse Width: 0.4 ms
Lead Channel Setting Sensing Sensitivity: 2 mV

## 2019-02-03 ENCOUNTER — Encounter: Payer: Self-pay | Admitting: Cardiology

## 2019-02-03 ENCOUNTER — Ambulatory Visit (INDEPENDENT_AMBULATORY_CARE_PROVIDER_SITE_OTHER): Payer: Medicare HMO | Admitting: Pharmacist Clinician (PhC)/ Clinical Pharmacy Specialist

## 2019-02-03 DIAGNOSIS — Z7901 Long term (current) use of anticoagulants: Secondary | ICD-10-CM | POA: Diagnosis not present

## 2019-02-03 DIAGNOSIS — Z952 Presence of prosthetic heart valve: Secondary | ICD-10-CM

## 2019-02-03 LAB — POCT INR: INR: 3.1 — AB (ref 2.0–3.0)

## 2019-02-03 NOTE — Progress Notes (Signed)
Remote pacemaker transmission.   

## 2019-03-02 ENCOUNTER — Telehealth: Payer: Self-pay | Admitting: Pharmacist Clinician (PhC)/ Clinical Pharmacy Specialist

## 2019-03-02 NOTE — Telephone Encounter (Signed)

## 2019-03-03 ENCOUNTER — Ambulatory Visit (INDEPENDENT_AMBULATORY_CARE_PROVIDER_SITE_OTHER): Payer: Medicare HMO | Admitting: *Deleted

## 2019-03-03 ENCOUNTER — Other Ambulatory Visit: Payer: Self-pay

## 2019-03-03 DIAGNOSIS — Z7901 Long term (current) use of anticoagulants: Secondary | ICD-10-CM

## 2019-03-03 DIAGNOSIS — Z952 Presence of prosthetic heart valve: Secondary | ICD-10-CM

## 2019-03-03 LAB — POCT INR: INR: 3.5 — AB (ref 2.0–3.0)

## 2019-03-03 NOTE — Patient Instructions (Signed)
Description   Ccontinue taking 1 tablet each Monday, Wednesday and Friday, 1.5 tablets all other days.  Repeat INR in 6 weeks.

## 2019-04-13 ENCOUNTER — Telehealth: Payer: Self-pay

## 2019-04-13 NOTE — Telephone Encounter (Signed)

## 2019-04-14 ENCOUNTER — Ambulatory Visit (INDEPENDENT_AMBULATORY_CARE_PROVIDER_SITE_OTHER): Payer: Medicare HMO | Admitting: Pharmacist

## 2019-04-14 ENCOUNTER — Other Ambulatory Visit: Payer: Self-pay

## 2019-04-14 DIAGNOSIS — Z952 Presence of prosthetic heart valve: Secondary | ICD-10-CM | POA: Diagnosis not present

## 2019-04-14 DIAGNOSIS — Z7901 Long term (current) use of anticoagulants: Secondary | ICD-10-CM

## 2019-04-14 LAB — POCT INR: INR: 3.3 — AB (ref 2.0–3.0)

## 2019-04-15 NOTE — Patient Instructions (Signed)
Description   Continue taking 1 tablet each Monday, Wednesday and Friday, 1.5 tablets all other days.  Repeat INR in 6 weeks.

## 2019-04-19 ENCOUNTER — Other Ambulatory Visit: Payer: Self-pay | Admitting: Cardiovascular Disease

## 2019-04-27 ENCOUNTER — Ambulatory Visit (INDEPENDENT_AMBULATORY_CARE_PROVIDER_SITE_OTHER): Payer: Medicare HMO | Admitting: *Deleted

## 2019-04-27 DIAGNOSIS — I495 Sick sinus syndrome: Secondary | ICD-10-CM | POA: Diagnosis not present

## 2019-04-27 DIAGNOSIS — I471 Supraventricular tachycardia: Secondary | ICD-10-CM

## 2019-04-28 LAB — CUP PACEART REMOTE DEVICE CHECK
Battery Impedance: 1218 Ohm
Battery Remaining Longevity: 61 mo
Battery Voltage: 2.78 V
Brady Statistic AP VP Percent: 0 %
Brady Statistic AP VS Percent: 75 %
Brady Statistic AS VP Percent: 0 %
Brady Statistic AS VS Percent: 24 %
Date Time Interrogation Session: 20200527000131
Implantable Lead Implant Date: 20110412
Implantable Lead Implant Date: 20110412
Implantable Lead Location: 753859
Implantable Lead Location: 753860
Implantable Lead Model: 5076
Implantable Lead Model: 5076
Implantable Pulse Generator Implant Date: 20110412
Lead Channel Impedance Value: 455 Ohm
Lead Channel Impedance Value: 603 Ohm
Lead Channel Pacing Threshold Amplitude: 0.5 V
Lead Channel Pacing Threshold Amplitude: 1 V
Lead Channel Pacing Threshold Pulse Width: 0.4 ms
Lead Channel Pacing Threshold Pulse Width: 0.4 ms
Lead Channel Setting Pacing Amplitude: 2 V
Lead Channel Setting Pacing Amplitude: 2 V
Lead Channel Setting Pacing Pulse Width: 0.4 ms
Lead Channel Setting Sensing Sensitivity: 2 mV

## 2019-05-07 NOTE — Progress Notes (Signed)
Remote pacemaker transmission.   

## 2019-05-12 DIAGNOSIS — Z7901 Long term (current) use of anticoagulants: Secondary | ICD-10-CM | POA: Diagnosis not present

## 2019-05-12 DIAGNOSIS — I10 Essential (primary) hypertension: Secondary | ICD-10-CM | POA: Diagnosis not present

## 2019-05-12 DIAGNOSIS — E785 Hyperlipidemia, unspecified: Secondary | ICD-10-CM | POA: Diagnosis not present

## 2019-05-12 DIAGNOSIS — Z950 Presence of cardiac pacemaker: Secondary | ICD-10-CM | POA: Diagnosis not present

## 2019-05-24 ENCOUNTER — Telehealth: Payer: Self-pay

## 2019-05-24 NOTE — Telephone Encounter (Signed)

## 2019-05-28 ENCOUNTER — Other Ambulatory Visit: Payer: Self-pay

## 2019-05-28 ENCOUNTER — Ambulatory Visit (INDEPENDENT_AMBULATORY_CARE_PROVIDER_SITE_OTHER): Payer: Medicare HMO | Admitting: *Deleted

## 2019-05-28 DIAGNOSIS — Z952 Presence of prosthetic heart valve: Secondary | ICD-10-CM | POA: Diagnosis not present

## 2019-05-28 DIAGNOSIS — Z7901 Long term (current) use of anticoagulants: Secondary | ICD-10-CM | POA: Diagnosis not present

## 2019-05-28 LAB — POCT INR: INR: 3.1 — AB (ref 2.0–3.0)

## 2019-05-28 NOTE — Patient Instructions (Signed)
Description   Continue taking 1 tablet each Monday, Wednesday and Friday, 1.5 tablets all other days.  Repeat INR in 6 weeks.

## 2019-06-17 ENCOUNTER — Other Ambulatory Visit: Payer: Self-pay | Admitting: Internal Medicine

## 2019-06-17 DIAGNOSIS — Z1231 Encounter for screening mammogram for malignant neoplasm of breast: Secondary | ICD-10-CM

## 2019-06-26 ENCOUNTER — Other Ambulatory Visit: Payer: Self-pay | Admitting: Cardiovascular Disease

## 2019-07-09 ENCOUNTER — Ambulatory Visit (INDEPENDENT_AMBULATORY_CARE_PROVIDER_SITE_OTHER): Payer: Medicare HMO | Admitting: Pharmacist Clinician (PhC)/ Clinical Pharmacy Specialist

## 2019-07-09 ENCOUNTER — Other Ambulatory Visit: Payer: Self-pay

## 2019-07-09 DIAGNOSIS — Z952 Presence of prosthetic heart valve: Secondary | ICD-10-CM

## 2019-07-09 DIAGNOSIS — Z7901 Long term (current) use of anticoagulants: Secondary | ICD-10-CM

## 2019-07-09 LAB — POCT INR: INR: 2.9 (ref 2.0–3.0)

## 2019-07-28 ENCOUNTER — Ambulatory Visit (INDEPENDENT_AMBULATORY_CARE_PROVIDER_SITE_OTHER): Payer: Medicare HMO | Admitting: *Deleted

## 2019-07-28 DIAGNOSIS — I495 Sick sinus syndrome: Secondary | ICD-10-CM

## 2019-07-29 LAB — CUP PACEART REMOTE DEVICE CHECK
Battery Impedance: 1323 Ohm
Battery Remaining Longevity: 57 mo
Battery Voltage: 2.77 V
Brady Statistic AP VP Percent: 0 %
Brady Statistic AP VS Percent: 76 %
Brady Statistic AS VP Percent: 0 %
Brady Statistic AS VS Percent: 24 %
Date Time Interrogation Session: 20200826231023
Implantable Lead Implant Date: 20110412
Implantable Lead Implant Date: 20110412
Implantable Lead Location: 753859
Implantable Lead Location: 753860
Implantable Lead Model: 5076
Implantable Lead Model: 5076
Implantable Pulse Generator Implant Date: 20110412
Lead Channel Impedance Value: 456 Ohm
Lead Channel Impedance Value: 603 Ohm
Lead Channel Pacing Threshold Amplitude: 0.5 V
Lead Channel Pacing Threshold Amplitude: 0.875 V
Lead Channel Pacing Threshold Pulse Width: 0.4 ms
Lead Channel Pacing Threshold Pulse Width: 0.4 ms
Lead Channel Setting Pacing Amplitude: 2 V
Lead Channel Setting Pacing Amplitude: 2 V
Lead Channel Setting Pacing Pulse Width: 0.4 ms
Lead Channel Setting Sensing Sensitivity: 2 mV

## 2019-07-30 ENCOUNTER — Ambulatory Visit
Admission: RE | Admit: 2019-07-30 | Discharge: 2019-07-30 | Disposition: A | Discharge status: Home / Self Care | Payer: Medicare HMO | Source: Ambulatory Visit | Attending: Internal Medicine | Admitting: Internal Medicine

## 2019-07-30 ENCOUNTER — Other Ambulatory Visit: Payer: Self-pay

## 2019-07-30 DIAGNOSIS — Z1231 Encounter for screening mammogram for malignant neoplasm of breast: Secondary | ICD-10-CM | POA: Diagnosis not present

## 2019-08-06 ENCOUNTER — Encounter: Payer: Self-pay | Admitting: Cardiology

## 2019-08-06 NOTE — Progress Notes (Signed)
Remote pacemaker transmission.   

## 2019-08-20 ENCOUNTER — Ambulatory Visit (INDEPENDENT_AMBULATORY_CARE_PROVIDER_SITE_OTHER): Payer: Medicare HMO | Admitting: Pharmacist Clinician (PhC)/ Clinical Pharmacy Specialist

## 2019-08-20 ENCOUNTER — Other Ambulatory Visit: Payer: Self-pay

## 2019-08-20 DIAGNOSIS — Z7901 Long term (current) use of anticoagulants: Secondary | ICD-10-CM

## 2019-08-20 DIAGNOSIS — Z952 Presence of prosthetic heart valve: Secondary | ICD-10-CM

## 2019-08-20 LAB — POCT INR: INR: 3.3 — AB (ref 2.0–3.0)

## 2019-08-23 ENCOUNTER — Other Ambulatory Visit: Payer: Self-pay | Admitting: Cardiovascular Disease

## 2019-08-23 MED ORDER — WARFARIN SODIUM 2.5 MG PO TABS: ORAL_TABLET | ORAL | 0 refills | Status: DC

## 2019-08-23 NOTE — Telephone Encounter (Signed)
New message   *STAT* If patient is at the pharmacy, call can be transferred to refill team.   1. Which medications need to be refilled? (please list name of each medication and dose if known) [place], [place]  2. Which pharmacy/location (including street and city if local pharmacy) is medication to be sent to?[place], [place]  3. Do they need a 30 day or 90 day supply? [place]

## 2019-09-06 ENCOUNTER — Other Ambulatory Visit: Payer: Self-pay | Admitting: Cardiovascular Disease

## 2019-09-08 ENCOUNTER — Other Ambulatory Visit: Payer: Self-pay | Admitting: Cardiovascular Disease

## 2019-10-01 ENCOUNTER — Ambulatory Visit (INDEPENDENT_AMBULATORY_CARE_PROVIDER_SITE_OTHER): Payer: Medicare HMO | Admitting: Pharmacist Clinician (PhC)/ Clinical Pharmacy Specialist

## 2019-10-01 ENCOUNTER — Other Ambulatory Visit: Payer: Self-pay

## 2019-10-01 DIAGNOSIS — Z7901 Long term (current) use of anticoagulants: Secondary | ICD-10-CM

## 2019-10-01 DIAGNOSIS — Z952 Presence of prosthetic heart valve: Secondary | ICD-10-CM | POA: Diagnosis not present

## 2019-10-01 LAB — POCT INR: INR: 3.1 — AB (ref 2.0–3.0)

## 2019-10-18 ENCOUNTER — Other Ambulatory Visit: Payer: Self-pay | Admitting: Cardiovascular Disease

## 2019-10-27 ENCOUNTER — Ambulatory Visit (INDEPENDENT_AMBULATORY_CARE_PROVIDER_SITE_OTHER): Payer: Medicare HMO | Admitting: *Deleted

## 2019-10-27 DIAGNOSIS — I442 Atrioventricular block, complete: Secondary | ICD-10-CM | POA: Diagnosis not present

## 2019-10-27 LAB — CUP PACEART REMOTE DEVICE CHECK
Battery Impedance: 1404 Ohm
Battery Remaining Longevity: 55 mo
Battery Voltage: 2.77 V
Brady Statistic AP VP Percent: 0 %
Brady Statistic AP VS Percent: 77 %
Brady Statistic AS VP Percent: 0 %
Brady Statistic AS VS Percent: 23 %
Date Time Interrogation Session: 20201125163517
Implantable Lead Implant Date: 20110412
Implantable Lead Implant Date: 20110412
Implantable Lead Location: 753859
Implantable Lead Location: 753860
Implantable Lead Model: 5076
Implantable Lead Model: 5076
Implantable Pulse Generator Implant Date: 20110412
Lead Channel Impedance Value: 456 Ohm
Lead Channel Impedance Value: 614 Ohm
Lead Channel Pacing Threshold Amplitude: 0.5 V
Lead Channel Pacing Threshold Amplitude: 0.875 V
Lead Channel Pacing Threshold Pulse Width: 0.4 ms
Lead Channel Pacing Threshold Pulse Width: 0.4 ms
Lead Channel Setting Pacing Amplitude: 2 V
Lead Channel Setting Pacing Amplitude: 2 V
Lead Channel Setting Pacing Pulse Width: 0.4 ms
Lead Channel Setting Sensing Sensitivity: 2 mV

## 2019-11-08 ENCOUNTER — Ambulatory Visit (INDEPENDENT_AMBULATORY_CARE_PROVIDER_SITE_OTHER): Payer: Medicare HMO | Admitting: Pharmacist

## 2019-11-08 ENCOUNTER — Ambulatory Visit (INDEPENDENT_AMBULATORY_CARE_PROVIDER_SITE_OTHER): Payer: Medicare HMO | Admitting: Cardiovascular Disease

## 2019-11-08 ENCOUNTER — Encounter: Payer: Self-pay | Admitting: Cardiovascular Disease

## 2019-11-08 ENCOUNTER — Other Ambulatory Visit: Payer: Self-pay

## 2019-11-08 VITALS — BP 138/72 | HR 80 | Temp 96.4°F | Ht 66.5 in | Wt 126.9 lb

## 2019-11-08 DIAGNOSIS — Z7901 Long term (current) use of anticoagulants: Secondary | ICD-10-CM | POA: Diagnosis not present

## 2019-11-08 DIAGNOSIS — I471 Supraventricular tachycardia: Secondary | ICD-10-CM

## 2019-11-08 DIAGNOSIS — Z950 Presence of cardiac pacemaker: Secondary | ICD-10-CM

## 2019-11-08 DIAGNOSIS — I453 Trifascicular block: Secondary | ICD-10-CM | POA: Diagnosis not present

## 2019-11-08 DIAGNOSIS — E7800 Pure hypercholesterolemia, unspecified: Secondary | ICD-10-CM

## 2019-11-08 DIAGNOSIS — R636 Underweight: Secondary | ICD-10-CM | POA: Diagnosis not present

## 2019-11-08 DIAGNOSIS — Z8679 Personal history of other diseases of the circulatory system: Secondary | ICD-10-CM | POA: Diagnosis not present

## 2019-11-08 DIAGNOSIS — Z952 Presence of prosthetic heart valve: Secondary | ICD-10-CM | POA: Diagnosis not present

## 2019-11-08 DIAGNOSIS — I495 Sick sinus syndrome: Secondary | ICD-10-CM

## 2019-11-08 LAB — POCT INR: INR: 3.1 — AB (ref 2.0–3.0)

## 2019-11-08 NOTE — Progress Notes (Signed)
Cardiology Office Note    Date:  11/08/2019   ID:  Maly, Ervine 04-16-1946, MRN [id number]  PCP:  Leeroy Cha, MD  Cardiologist:   Sanda Klein, MD   Chief complaint: pacemaker and prosthetic valve check   History of Present Illness:  [name] [name] is a 73 y.o. female who is almost 10 years status post Bentall repair of a small ascending aortic aneurysm at the time of aortic valve replacement with a mechanical prosthesis for bicuspid aortic valve with severe aortic stenosis. She received a dual-chamber permanent pacemaker (Medtronic Adapta dual-chamber, 2011) at the time of her surgery for what appeared to be permanent AV block, but has subsequently recovered normal AV conduction. She then developed findings of sinus node dysfunction and requires atrial pacing. She had normal coronary arteries at the time of angiography preoperatively. Her last echo was in 2017.  The patient specifically denies any chest pain at rest exertion, dyspnea at rest or with exertion, orthopnea, paroxysmal nocturnal dyspnea, syncope, palpitations, focal neurological deficits, intermittent claudication, lower extremity edema, unexplained weight gain, cough, hemoptysis or wheezing.  She is starting to lose weight again and now has a BMI of 20.  She denies falls, injury or bleeding problems.  INR is generally in therapeutic range.  Pacemaker interrogation shows estimated generator longevity of 4.5 years. There has been no atrial fibrillation and no ventricular tachycardia. Has 77 % atrial pacing but <0.3 % ventricular pacing. Heart rate histogram distribution is excellent.  She has not had any meaningful episodes of high atrial or ventricular rates.  Both leads are Medtronic 5076 leads and have excellent parameters.  Past Medical History:  Diagnosis Date  . Aortic stenosis 03/07/2010   replaced mechanical valve Bentall procedure\  . Atrial fibrillation ([place])   . Coronary artery disease    Dr. Avon Gully  . Dyslipidemia   . Hypertension   . Intermittent complete heart block ([place])    H/O  . Presence of permanent cardiac pacemaker   . S/P AAA repair 03/07/2010  . Systemic hypertension     Past Surgical History:  Procedure Laterality Date  . ABDOMINAL AORTIC ANEURYSM REPAIR  03/07/2010  . AORTIC VALVE REPLACEMENT  03/07/2010   mechanical valve Bentall procedure  . CARDIAC CATHETERIZATION  08/25/2009   severe AS, mild CAD  . COLONOSCOPY WITH PROPOFOL N/A 09/12/2015   Procedure: COLONOSCOPY WITH PROPOFOL;  Surgeon: Juanita Craver, MD;  Location: [facility];  Service: Endoscopy;  Laterality: N/A;  . PACEMAKER INSERTION  03/13/2010   Medtronic Adapta  . US ECHOCARDIOGRAPHY  08/09/2011   mod. concentric LVH,LA mildly dilated,ca+ MV,trace TR and AI,mechanical AOV    Current Medications: Outpatient Medications Prior to Visit  Medication Sig Dispense Refill  . aspirin 81 MG chewable tablet Chew 81 mg by mouth daily.    Marland Kitchen atorvastatin (LIPITOR) 40 MG tablet TAKE 1 TABLET EVERY DAY 90 tablet 0  . Calcium Carbonate (CALCARB 600 PO) Take 1 tablet by mouth daily.    . clindamycin (CLEOCIN) 150 MG capsule Take 3 capsules by mouth as directed. For dental procedure/cleanings    . hydrochlorothiazide (HYDRODIURIL) 25 MG tablet TAKE 1 TABLET EVERY OTHER DAY 45 tablet 0  . lisinopril (ZESTRIL) 20 MG tablet TAKE 1 TABLET EVERY MORNING (NEED MD APPOINTMENT FOR REFILLS) 90 tablet 0  . metoprolol tartrate (LOPRESSOR) 50 MG tablet TAKE 1 TABLET TWICE DAILY 180 tablet 0  . Multiple Vitamin (MULITIVITAMIN WITH MINERALS) TABS Take 1 tablet by mouth daily.    Marland Kitchen  warfarin (COUMADIN) 2.5 MG tablet TAKE 1 TO 1 AND 1/2 TABLETS DAILY AS DIRECTED BY COUMADIN CLINIC 135 tablet 0   No facility-administered medications prior to visit.      Allergies:   Crestor [rosuvastatin calcium], Latex, and Penicillins   Social History   Socioeconomic History  . Marital status: Widowed    Spouse name: Not on file  .  Number of children: Not on file  . Years of education: Not on file  . Highest education level: Not on file  Occupational History  . Not on file  Social Needs  . Financial resource strain: Not on file  . Food insecurity    Worry: Not on file    Inability: Not on file  . Transportation needs    Medical: Not on file    Non-medical: Not on file  Tobacco Use  . Smoking status: Former Smoker    Quit date: 12/02/2009    Years since quitting: 9.9  . Smokeless tobacco: Never Used  Substance and Sexual Activity  . Alcohol use: No  . Drug use: No  . Sexual activity: Never  Lifestyle  . Physical activity    Days per week: Not on file    Minutes per session: Not on file  . Stress: Not on file  Relationships  . Social Herbalist on phone: Not on file    Gets together: Not on file    Attends religious service: Not on file    Active member of club or organization: Not on file    Attends meetings of clubs or organizations: Not on file    Relationship status: Not on file  Other Topics Concern  . Not on file  Social History Narrative  . Not on file     Family History:  The patient's family history includes Diabetes in her brother and mother; Heart failure in her brother; Hypertension in her brother and mother.   ROS:   Please see the history of present illness.    ROS All other systems are reviewed and are negative.   PHYSICAL EXAM:   VS:  BP 138/72   Pulse 80   Temp (!) 96.4 F (35.8 C)   Ht 5' 6.5" (1.689 m)   Wt 126 lb 14.4 oz (57.6 kg)   BMI 20.18 kg/m      General: Alert, oriented x3, no distress, healthy left subclavian pacemaker site Head: no evidence of trauma, PERRL, EOMI, no exophtalmos or lid lag, no myxedema, no xanthelasma; normal ears, nose and oropharynx Neck: normal jugular venous pulsations and no hepatojugular reflux; brisk carotid pulses without delay and no carotid bruits Chest: clear to auscultation, no signs of consolidation by percussion or  palpation, normal fremitus, symmetrical and full respiratory excursions Cardiovascular: normal position and quality of the apical impulse, regular rhythm, mechanical prosthetic valve sounds, normal first and second heart sounds, 1/6 aortic ejection murmur, early peaking, no diastolic murmurs, rubs or gallops Abdomen: no tenderness or distention, no masses by palpation, no abnormal pulsatility or arterial bruits, normal bowel sounds, no hepatosplenomegaly Extremities: no clubbing, cyanosis or edema; 2+ radial, ulnar and brachial pulses bilaterally; 2+ right femoral, posterior tibial and dorsalis pedis pulses; 2+ left femoral, posterior tibial and dorsalis pedis pulses; no subclavian or femoral bruits Neurological: grossly nonfocal Psych: Normal mood and affect   Wt Readings from Last 3 Encounters:  11/08/19 126 lb 14.4 oz (57.6 kg)  11/06/18 135 lb 9.6 oz (61.5 kg)  11/13/17 134 lb (  60.8 kg)      Studies/Labs Reviewed:   EKG:  EKG is ordered today.  It shows atrial paced, ventricular sensed rhythm.  The she has "trifascicular block".  AV delay is prolonged at 290 ms and she has chronic right bundle branch block and left anterior fascicular block.  The QTC is normal (allowing for IVCD) at 484 ms Recent Labs: Labs from October 21, 2017 at [facility] Normal LFTs, BUN 19, creatinine 0.9 by, potassium 3.6, hemoglobin 11.5 Total cholesterol 179, HDL 79, LDL 86, triglycerides 68 glucose 92   10/26/2018 Total cholesterol 155, HDL 58, LDL 79, triglycerides 87 Hemoglobin 10.9, creatinine 0.97, potassium 4.8, normal liver function tests  ASSESSMENT:    1. H/O aortic valve replacement   2. SSS (sick sinus syndrome) ([place])   3. Trifascicular block   4. Pacemaker   5. History of aortic aneurysm   6. Hypercholesterolemia   7. PAT (paroxysmal atrial tachycardia) ([place])   8. Underweight     PLAN:  In order of problems listed above:  1. SSS: Mostly atrial paced rhythm, heart rate  histogram distribution appears appropriate, although there is a small second peak at about 100 bpm.  No changes were made to device settings today.. 2. Tri-/Bifascicular block: Although she has extensive evidence of intraventricular conduction abnormalities, she almost never requires ventricular pacing. 3. PPM: Normal device function.  Continue remote downloads every 3 months. 4. AVR: Her most recent echocardiogram was in 2017 and needs to be updated.  Will delay until the coronavirus pandemic is under little better control and check it in late 2021. 5. Ao Aneurysm s/p repair: related to bicuspid valve.  Reevaluate echo.  If there is any sign of root enlargement may need CT imaging.   6. HLP: On statin acceptable lipid profile 1 year ago.  Due for an update with her PCP soon.  She does not have known coronary or peripheral vascular disease. 7. PAT: Asymptomatic, very brief and very infrequent; no treatment necessary. 8. Underweight: Losing weight again.  Etiology uncertain.  Advised increased intake of calories and protein.  Medication Adjustments/Labs and Tests Ordered: Current medicines are reviewed at length with the patient today.  Concerns regarding medicines are outlined above.  Medication changes, Labs and Tests ordered today are listed in the Patient Instructions below. Patient Instructions  Medication Instructions:  No changes *If you need a refill on your cardiac medications before your next appointment, please call your pharmacy*  Lab Work: None ordered If you have labs (blood work) drawn today and your tests are completely normal, you will receive your results only by: Marland Kitchen MyChart Message (if you have MyChart) OR . A paper copy in the mail If you have any lab test that is abnormal or we need to change your treatment, we will call you to review the results.  Testing/Procedures: Your physician has requested that you have an echocardiogram in 12 months. Echocardiography is a painless  test that uses sound waves to create images of your heart. It provides your doctor with information about the size and shape of your heart and how well your heart's chambers and valves are working. You may receive an ultrasound enhancing agent through an IV if needed to better visualize your heart during the echo.This procedure takes approximately one hour. There are no restrictions for this procedure. This will take place at the [address].    Follow-Up: At [facility], you and your health needs are our priority.  As part of our continuing mission to provide you with exceptional heart care, we have created designated Provider Care Teams.  These Care Teams include your primary Cardiologist (physician) and Advanced Practice Providers (APPs -  Physician Assistants and Nurse Practitioners) who all work together to provide you with the care you need, when you need it.  Your next appointment:   12 month(s)  The format for your next appointment:   In Person  Provider:   Sanda Klein, MD       Signed, Sanda Klein, MD  11/08/2019 1:41 PM    [facility] [place], [place], [place]  [address] Phone: [phone]; Fax: [phone]

## 2019-11-08 NOTE — Patient Instructions (Signed)
Medication Instructions:  No changes *If you need a refill on your cardiac medications before your next appointment, please call your pharmacy*  Lab Work: None ordered If you have labs (blood work) drawn today and your tests are completely normal, you will receive your results only by: Marland Kitchen MyChart Message (if you have MyChart) OR . A paper copy in the mail If you have any lab test that is abnormal or we need to change your treatment, we will call you to review the results.  Testing/Procedures: Your physician has requested that you have an echocardiogram in 12 months. Echocardiography is a painless test that uses sound waves to create images of your heart. It provides your doctor with information about the size and shape of your heart and how well your heart's chambers and valves are working. You may receive an ultrasound enhancing agent through an IV if needed to better visualize your heart during the echo.This procedure takes approximately one hour. There are no restrictions for this procedure. This will take place at the [address], [address].    Follow-Up: At [facility], you and your health needs are our priority.  As part of our continuing mission to provide you with exceptional heart care, we have created designated Provider Care Teams.  These Care Teams include your primary Cardiologist (physician) and Advanced Practice Providers (APPs -  Physician Assistants and Nurse Practitioners) who all work together to provide you with the care you need, when you need it.  Your next appointment:   12 month(s)  The format for your next appointment:   In Person  Provider:   Sanda Klein, MD

## 2019-11-09 DIAGNOSIS — I712 Thoracic aortic aneurysm, without rupture: Secondary | ICD-10-CM | POA: Diagnosis not present

## 2019-11-09 DIAGNOSIS — D6859 Other primary thrombophilia: Secondary | ICD-10-CM | POA: Diagnosis not present

## 2019-11-09 DIAGNOSIS — Z950 Presence of cardiac pacemaker: Secondary | ICD-10-CM | POA: Diagnosis not present

## 2019-11-09 DIAGNOSIS — E785 Hyperlipidemia, unspecified: Secondary | ICD-10-CM | POA: Diagnosis not present

## 2019-11-09 DIAGNOSIS — Z0000 Encounter for general adult medical examination without abnormal findings: Secondary | ICD-10-CM | POA: Diagnosis not present

## 2019-11-09 DIAGNOSIS — Z1389 Encounter for screening for other disorder: Secondary | ICD-10-CM | POA: Diagnosis not present

## 2019-11-09 DIAGNOSIS — I471 Supraventricular tachycardia: Secondary | ICD-10-CM | POA: Diagnosis not present

## 2019-11-09 DIAGNOSIS — Z780 Asymptomatic menopausal state: Secondary | ICD-10-CM | POA: Diagnosis not present

## 2019-11-09 DIAGNOSIS — I10 Essential (primary) hypertension: Secondary | ICD-10-CM | POA: Diagnosis not present

## 2019-11-09 DIAGNOSIS — Z7901 Long term (current) use of anticoagulants: Secondary | ICD-10-CM | POA: Diagnosis not present

## 2019-11-10 ENCOUNTER — Other Ambulatory Visit (INDEPENDENT_AMBULATORY_CARE_PROVIDER_SITE_OTHER): Payer: Medicare HMO

## 2019-11-10 DIAGNOSIS — I495 Sick sinus syndrome: Secondary | ICD-10-CM

## 2019-11-10 DIAGNOSIS — Z7901 Long term (current) use of anticoagulants: Secondary | ICD-10-CM

## 2019-11-10 DIAGNOSIS — I453 Trifascicular block: Secondary | ICD-10-CM | POA: Diagnosis not present

## 2019-11-10 DIAGNOSIS — E7800 Pure hypercholesterolemia, unspecified: Secondary | ICD-10-CM

## 2019-11-10 DIAGNOSIS — Z8679 Personal history of other diseases of the circulatory system: Secondary | ICD-10-CM

## 2019-11-10 DIAGNOSIS — Z950 Presence of cardiac pacemaker: Secondary | ICD-10-CM

## 2019-11-10 DIAGNOSIS — I442 Atrioventricular block, complete: Secondary | ICD-10-CM

## 2019-11-10 DIAGNOSIS — I452 Bifascicular block: Secondary | ICD-10-CM

## 2019-11-10 DIAGNOSIS — I712 Thoracic aortic aneurysm, without rupture: Secondary | ICD-10-CM | POA: Diagnosis not present

## 2019-11-10 DIAGNOSIS — I471 Supraventricular tachycardia: Secondary | ICD-10-CM

## 2019-11-10 DIAGNOSIS — Z9889 Other specified postprocedural states: Secondary | ICD-10-CM

## 2019-11-10 DIAGNOSIS — Z952 Presence of prosthetic heart valve: Secondary | ICD-10-CM | POA: Diagnosis not present

## 2019-11-10 DIAGNOSIS — I7121 Aneurysm of the ascending aorta, without rupture: Secondary | ICD-10-CM

## 2019-11-15 ENCOUNTER — Telehealth: Payer: Self-pay | Admitting: Cardiovascular Disease

## 2019-11-15 ENCOUNTER — Other Ambulatory Visit: Payer: Self-pay | Admitting: Cardiovascular Disease

## 2019-11-15 NOTE — Telephone Encounter (Signed)
Pt aware ./cy 

## 2019-11-15 NOTE — Telephone Encounter (Signed)
I agree w stopping the ASA. Thanks

## 2019-11-15 NOTE — Telephone Encounter (Signed)
Per pt PCP stated pt does not need to take ASA 81 mg in addition to Coumadin Will forward to Dr Sallyanne Kuster for review and recommendation ./cy

## 2019-11-15 NOTE — Telephone Encounter (Signed)
New Message     Pt c/o medication issue:  1. Name of Medication: aspirin 81 MG chewable tablet  2. How are you currently taking this medication (dosage and times per day)? 1 x daily   3. Are you having a reaction (difficulty breathing--STAT)? no  4. What is your medication issue? Pt says her PC told her she didn't need to take the aspirin daily and she is calling to see if this is okay

## 2019-11-23 NOTE — Progress Notes (Signed)
PPM remote 

## 2019-12-20 ENCOUNTER — Ambulatory Visit (INDEPENDENT_AMBULATORY_CARE_PROVIDER_SITE_OTHER): Payer: Medicare HMO | Admitting: Pharmacist

## 2019-12-20 ENCOUNTER — Other Ambulatory Visit: Payer: Self-pay

## 2019-12-20 DIAGNOSIS — Z7901 Long term (current) use of anticoagulants: Secondary | ICD-10-CM | POA: Diagnosis not present

## 2019-12-20 DIAGNOSIS — Z952 Presence of prosthetic heart valve: Secondary | ICD-10-CM

## 2019-12-20 LAB — POCT INR: INR: 3.8 — AB (ref 2.0–3.0)

## 2019-12-27 ENCOUNTER — Other Ambulatory Visit: Payer: Self-pay | Admitting: Cardiovascular Disease

## 2019-12-27 DIAGNOSIS — H2511 Age-related nuclear cataract, right eye: Secondary | ICD-10-CM | POA: Diagnosis not present

## 2019-12-27 DIAGNOSIS — Z961 Presence of intraocular lens: Secondary | ICD-10-CM | POA: Diagnosis not present

## 2019-12-27 DIAGNOSIS — H04123 Dry eye syndrome of bilateral lacrimal glands: Secondary | ICD-10-CM | POA: Diagnosis not present

## 2019-12-27 DIAGNOSIS — H26492 Other secondary cataract, left eye: Secondary | ICD-10-CM | POA: Diagnosis not present

## 2019-12-27 DIAGNOSIS — H34231 Retinal artery branch occlusion, right eye: Secondary | ICD-10-CM | POA: Diagnosis not present

## 2020-01-17 ENCOUNTER — Ambulatory Visit (INDEPENDENT_AMBULATORY_CARE_PROVIDER_SITE_OTHER): Payer: Medicare HMO | Admitting: Pharmacist Clinician (PhC)/ Clinical Pharmacy Specialist

## 2020-01-17 ENCOUNTER — Other Ambulatory Visit: Payer: Self-pay

## 2020-01-17 DIAGNOSIS — Z7901 Long term (current) use of anticoagulants: Secondary | ICD-10-CM | POA: Diagnosis not present

## 2020-01-17 DIAGNOSIS — Z952 Presence of prosthetic heart valve: Secondary | ICD-10-CM | POA: Diagnosis not present

## 2020-01-17 LAB — POCT INR: INR: 3.2 — AB (ref 2.0–3.0)

## 2020-01-26 ENCOUNTER — Ambulatory Visit (INDEPENDENT_AMBULATORY_CARE_PROVIDER_SITE_OTHER): Payer: Medicare HMO | Admitting: *Deleted

## 2020-01-26 DIAGNOSIS — I442 Atrioventricular block, complete: Secondary | ICD-10-CM | POA: Diagnosis not present

## 2020-01-27 LAB — CUP PACEART REMOTE DEVICE CHECK
Battery Impedance: 1484 Ohm
Battery Remaining Longevity: 52 mo
Battery Voltage: 2.77 V
Brady Statistic AP VP Percent: 0 %
Brady Statistic AP VS Percent: 78 %
Brady Statistic AS VP Percent: 0 %
Brady Statistic AS VS Percent: 22 %
Date Time Interrogation Session: 20210224185229
Implantable Lead Implant Date: 20110412
Implantable Lead Implant Date: 20110412
Implantable Lead Location: 753859
Implantable Lead Location: 753860
Implantable Lead Model: 5076
Implantable Lead Model: 5076
Implantable Pulse Generator Implant Date: 20110412
Lead Channel Impedance Value: 456 Ohm
Lead Channel Impedance Value: 628 Ohm
Lead Channel Pacing Threshold Amplitude: 0.5 V
Lead Channel Pacing Threshold Amplitude: 0.875 V
Lead Channel Pacing Threshold Pulse Width: 0.4 ms
Lead Channel Pacing Threshold Pulse Width: 0.4 ms
Lead Channel Sensing Intrinsic Amplitude: 1.4 mV
Lead Channel Sensing Intrinsic Amplitude: 5.6 mV
Lead Channel Setting Pacing Amplitude: 2 V
Lead Channel Setting Pacing Amplitude: 2 V
Lead Channel Setting Pacing Pulse Width: 0.4 ms
Lead Channel Setting Sensing Sensitivity: 2 mV

## 2020-02-28 ENCOUNTER — Ambulatory Visit (INDEPENDENT_AMBULATORY_CARE_PROVIDER_SITE_OTHER): Payer: Medicare HMO | Admitting: Pharmacist Clinician (PhC)/ Clinical Pharmacy Specialist

## 2020-02-28 ENCOUNTER — Other Ambulatory Visit: Payer: Self-pay

## 2020-02-28 ENCOUNTER — Ambulatory Visit: Payer: Medicare HMO | Attending: Internal Medicine

## 2020-02-28 DIAGNOSIS — Z7901 Long term (current) use of anticoagulants: Secondary | ICD-10-CM | POA: Diagnosis not present

## 2020-02-28 DIAGNOSIS — Z23 Encounter for immunization: Secondary | ICD-10-CM

## 2020-02-28 DIAGNOSIS — Z952 Presence of prosthetic heart valve: Secondary | ICD-10-CM

## 2020-02-28 LAB — POCT INR: INR: 3.5 — AB (ref 2.0–3.0)

## 2020-02-28 NOTE — Progress Notes (Signed)
   Covid-19 Vaccination Clinic  Name:  Berea Nogueras    MRN: [id number] DOB: 1946/08/06  02/28/2020  Ms. Heinzmann was observed post Covid-19 immunization for 15 minutes without incident. She was provided with Vaccine Information Sheet and instruction to access the V-Safe system.   Ms. Dewilde was instructed to call 911 with any severe reactions post vaccine: Marland Kitchen Difficulty breathing  . Swelling of face and throat  . A fast heartbeat  . A bad rash all over body  . Dizziness and weakness   Immunizations Administered    Name Date Dose VIS Date Route   Pfizer COVID-19 Vaccine 02/28/2020 12:33 PM 0.3 mL 11/12/2019 Intramuscular   Manufacturer: [place]   Lot: [id number]   [place]: [id number]

## 2020-03-03 ENCOUNTER — Other Ambulatory Visit: Payer: Self-pay | Admitting: Cardiovascular Disease

## 2020-03-28 ENCOUNTER — Ambulatory Visit: Payer: Medicare HMO | Attending: Internal Medicine

## 2020-03-28 ENCOUNTER — Ambulatory Visit: Payer: Medicare HMO

## 2020-03-28 DIAGNOSIS — Z23 Encounter for immunization: Secondary | ICD-10-CM

## 2020-03-28 NOTE — Progress Notes (Signed)
   Covid-19 Vaccination Clinic  Name:  [name] [name]    MRN: [id number] DOB: 06/20/1946  03/28/2020  [name] was observed post Covid-19 immunization for 15 minutes without incident. She was provided with Vaccine Information Sheet and instruction to access the V-Safe system.   [name] was instructed to call 911 with any severe reactions post vaccine: Marland Kitchen Difficulty breathing  . Swelling of face and throat  . A fast heartbeat  . A bad rash all over body  . Dizziness and weakness   Immunizations Administered    Name Date Dose VIS Date Route   Pfizer COVID-19 Vaccine 03/28/2020 10:55 AM 0.3 mL 01/26/2019 Intramuscular   Manufacturer: [place]   Lot: [id number]   [place]: [id number]

## 2020-04-10 ENCOUNTER — Other Ambulatory Visit: Payer: Self-pay

## 2020-04-10 ENCOUNTER — Ambulatory Visit (INDEPENDENT_AMBULATORY_CARE_PROVIDER_SITE_OTHER): Payer: Medicare HMO | Admitting: Pharmacist Clinician (PhC)/ Clinical Pharmacy Specialist

## 2020-04-10 DIAGNOSIS — Z952 Presence of prosthetic heart valve: Secondary | ICD-10-CM

## 2020-04-10 DIAGNOSIS — E785 Hyperlipidemia, unspecified: Secondary | ICD-10-CM | POA: Diagnosis not present

## 2020-04-10 DIAGNOSIS — N029 Recurrent and persistent hematuria with unspecified morphologic changes: Secondary | ICD-10-CM | POA: Diagnosis not present

## 2020-04-10 DIAGNOSIS — I10 Essential (primary) hypertension: Secondary | ICD-10-CM | POA: Diagnosis not present

## 2020-04-10 DIAGNOSIS — Z7901 Long term (current) use of anticoagulants: Secondary | ICD-10-CM | POA: Diagnosis not present

## 2020-04-10 LAB — POCT INR: INR: 4.4 — AB (ref 2.0–3.0)

## 2020-04-26 ENCOUNTER — Ambulatory Visit (INDEPENDENT_AMBULATORY_CARE_PROVIDER_SITE_OTHER): Payer: Medicare HMO | Admitting: *Deleted

## 2020-04-26 DIAGNOSIS — I495 Sick sinus syndrome: Secondary | ICD-10-CM | POA: Diagnosis not present

## 2020-04-27 LAB — CUP PACEART REMOTE DEVICE CHECK
Battery Impedance: 1569 Ohm
Battery Remaining Longevity: 50 mo
Battery Voltage: 2.77 V
Brady Statistic AP VP Percent: 0 %
Brady Statistic AP VS Percent: 78 %
Brady Statistic AS VP Percent: 0 %
Brady Statistic AS VS Percent: 21 %
Date Time Interrogation Session: 20210526154338
Implantable Lead Implant Date: 20110412
Implantable Lead Implant Date: 20110412
Implantable Lead Location: 753859
Implantable Lead Location: 753860
Implantable Lead Model: 5076
Implantable Lead Model: 5076
Implantable Pulse Generator Implant Date: 20110412
Lead Channel Impedance Value: 462 Ohm
Lead Channel Impedance Value: 595 Ohm
Lead Channel Pacing Threshold Amplitude: 0.5 V
Lead Channel Pacing Threshold Amplitude: 1 V
Lead Channel Pacing Threshold Pulse Width: 0.4 ms
Lead Channel Pacing Threshold Pulse Width: 0.4 ms
Lead Channel Setting Pacing Amplitude: 2 V
Lead Channel Setting Pacing Amplitude: 2 V
Lead Channel Setting Pacing Pulse Width: 0.4 ms
Lead Channel Setting Sensing Sensitivity: 2 mV

## 2020-04-27 NOTE — Progress Notes (Signed)
Remote pacemaker transmission.   

## 2020-05-08 ENCOUNTER — Other Ambulatory Visit: Payer: Self-pay

## 2020-05-08 ENCOUNTER — Ambulatory Visit (INDEPENDENT_AMBULATORY_CARE_PROVIDER_SITE_OTHER): Payer: Medicare HMO | Admitting: Pharmacist Clinician (PhC)/ Clinical Pharmacy Specialist

## 2020-05-08 DIAGNOSIS — Z952 Presence of prosthetic heart valve: Secondary | ICD-10-CM

## 2020-05-08 DIAGNOSIS — Z7901 Long term (current) use of anticoagulants: Secondary | ICD-10-CM

## 2020-05-08 LAB — POCT INR: INR: 3 (ref 2.0–3.0)

## 2020-05-09 DIAGNOSIS — L989 Disorder of the skin and subcutaneous tissue, unspecified: Secondary | ICD-10-CM | POA: Diagnosis not present

## 2020-05-09 DIAGNOSIS — I10 Essential (primary) hypertension: Secondary | ICD-10-CM | POA: Diagnosis not present

## 2020-05-09 DIAGNOSIS — E785 Hyperlipidemia, unspecified: Secondary | ICD-10-CM | POA: Diagnosis not present

## 2020-06-09 DIAGNOSIS — L821 Other seborrheic keratosis: Secondary | ICD-10-CM | POA: Diagnosis not present

## 2020-06-19 ENCOUNTER — Ambulatory Visit (INDEPENDENT_AMBULATORY_CARE_PROVIDER_SITE_OTHER): Payer: Medicare HMO | Admitting: Pharmacist

## 2020-06-19 ENCOUNTER — Other Ambulatory Visit: Payer: Self-pay

## 2020-06-19 DIAGNOSIS — Z952 Presence of prosthetic heart valve: Secondary | ICD-10-CM | POA: Diagnosis not present

## 2020-06-19 DIAGNOSIS — Z7901 Long term (current) use of anticoagulants: Secondary | ICD-10-CM

## 2020-06-19 LAB — POCT INR: INR: 2.5 (ref 2.0–3.0)

## 2020-06-26 DIAGNOSIS — E785 Hyperlipidemia, unspecified: Secondary | ICD-10-CM | POA: Diagnosis not present

## 2020-06-26 DIAGNOSIS — N029 Recurrent and persistent hematuria with unspecified morphologic changes: Secondary | ICD-10-CM | POA: Diagnosis not present

## 2020-06-26 DIAGNOSIS — I10 Essential (primary) hypertension: Secondary | ICD-10-CM | POA: Diagnosis not present

## 2020-07-03 DIAGNOSIS — M79671 Pain in right foot: Secondary | ICD-10-CM | POA: Diagnosis not present

## 2020-07-03 DIAGNOSIS — B351 Tinea unguium: Secondary | ICD-10-CM | POA: Diagnosis not present

## 2020-07-17 ENCOUNTER — Other Ambulatory Visit: Payer: Self-pay | Admitting: Cardiovascular Disease

## 2020-07-26 ENCOUNTER — Ambulatory Visit (INDEPENDENT_AMBULATORY_CARE_PROVIDER_SITE_OTHER): Payer: Medicare HMO | Admitting: *Deleted

## 2020-07-26 DIAGNOSIS — I442 Atrioventricular block, complete: Secondary | ICD-10-CM | POA: Diagnosis not present

## 2020-07-27 LAB — CUP PACEART REMOTE DEVICE CHECK
Battery Impedance: 1707 Ohm
Battery Remaining Longevity: 46 mo
Battery Voltage: 2.77 V
Brady Statistic AP VP Percent: 0 %
Brady Statistic AP VS Percent: 80 %
Brady Statistic AS VP Percent: 0 %
Brady Statistic AS VS Percent: 19 %
Date Time Interrogation Session: 20210825183956
Implantable Lead Implant Date: 20110412
Implantable Lead Implant Date: 20110412
Implantable Lead Location: 753859
Implantable Lead Location: 753860
Implantable Lead Model: 5076
Implantable Lead Model: 5076
Implantable Pulse Generator Implant Date: 20110412
Lead Channel Impedance Value: 469 Ohm
Lead Channel Impedance Value: 621 Ohm
Lead Channel Pacing Threshold Amplitude: 0.5 V
Lead Channel Pacing Threshold Amplitude: 0.75 V
Lead Channel Pacing Threshold Pulse Width: 0.4 ms
Lead Channel Pacing Threshold Pulse Width: 0.4 ms
Lead Channel Setting Pacing Amplitude: 2 V
Lead Channel Setting Pacing Amplitude: 2 V
Lead Channel Setting Pacing Pulse Width: 0.4 ms
Lead Channel Setting Sensing Sensitivity: 2 mV

## 2020-07-31 ENCOUNTER — Other Ambulatory Visit: Payer: Self-pay

## 2020-07-31 ENCOUNTER — Ambulatory Visit (INDEPENDENT_AMBULATORY_CARE_PROVIDER_SITE_OTHER): Payer: Medicare HMO

## 2020-07-31 DIAGNOSIS — Z952 Presence of prosthetic heart valve: Secondary | ICD-10-CM | POA: Diagnosis not present

## 2020-07-31 DIAGNOSIS — E785 Hyperlipidemia, unspecified: Secondary | ICD-10-CM | POA: Diagnosis not present

## 2020-07-31 DIAGNOSIS — Z7901 Long term (current) use of anticoagulants: Secondary | ICD-10-CM

## 2020-07-31 DIAGNOSIS — I10 Essential (primary) hypertension: Secondary | ICD-10-CM | POA: Diagnosis not present

## 2020-07-31 DIAGNOSIS — N029 Recurrent and persistent hematuria with unspecified morphologic changes: Secondary | ICD-10-CM | POA: Diagnosis not present

## 2020-07-31 LAB — POCT INR: INR: 3.1 — AB (ref 2.0–3.0)

## 2020-07-31 NOTE — Patient Instructions (Signed)
Continue taking 1 tablet each Monday, Wednesday and Friday, 1.5 tablets all other days.  Repeat INR in 6 weeks.

## 2020-08-01 NOTE — Progress Notes (Signed)
Remote pacemaker transmission.   

## 2020-08-11 DIAGNOSIS — I10 Essential (primary) hypertension: Secondary | ICD-10-CM | POA: Diagnosis not present

## 2020-08-11 DIAGNOSIS — E785 Hyperlipidemia, unspecified: Secondary | ICD-10-CM | POA: Diagnosis not present

## 2020-08-11 DIAGNOSIS — N029 Recurrent and persistent hematuria with unspecified morphologic changes: Secondary | ICD-10-CM | POA: Diagnosis not present

## 2020-09-03 ENCOUNTER — Other Ambulatory Visit: Payer: Self-pay | Admitting: Cardiovascular Disease

## 2020-09-11 ENCOUNTER — Other Ambulatory Visit: Payer: Self-pay

## 2020-09-11 ENCOUNTER — Ambulatory Visit (INDEPENDENT_AMBULATORY_CARE_PROVIDER_SITE_OTHER): Payer: Medicare HMO

## 2020-09-11 DIAGNOSIS — Z7901 Long term (current) use of anticoagulants: Secondary | ICD-10-CM | POA: Diagnosis not present

## 2020-09-11 DIAGNOSIS — Z952 Presence of prosthetic heart valve: Secondary | ICD-10-CM

## 2020-09-11 LAB — POCT INR: INR: 7.6 — AB (ref 2.0–3.0)

## 2020-09-11 NOTE — Patient Instructions (Signed)
Hold today, tomorrow and Wednesday and then Continue taking 1 tablet each Monday, Wednesday and Friday, 1.5 tablets all other days.  Repeat INR in 1 week.

## 2020-09-18 ENCOUNTER — Ambulatory Visit (INDEPENDENT_AMBULATORY_CARE_PROVIDER_SITE_OTHER): Payer: Medicare HMO

## 2020-09-18 ENCOUNTER — Other Ambulatory Visit: Payer: Self-pay

## 2020-09-18 DIAGNOSIS — Z7901 Long term (current) use of anticoagulants: Secondary | ICD-10-CM | POA: Diagnosis not present

## 2020-09-18 DIAGNOSIS — Z952 Presence of prosthetic heart valve: Secondary | ICD-10-CM

## 2020-09-18 LAB — POCT INR: INR: 3.7 — AB (ref 2.0–3.0)

## 2020-09-18 NOTE — Patient Instructions (Signed)
Continue taking 1 tablet each Monday, Wednesday and Friday, 1.5 tablets all other days.  Repeat INR in 4 week. Eat more greens

## 2020-10-16 ENCOUNTER — Ambulatory Visit (INDEPENDENT_AMBULATORY_CARE_PROVIDER_SITE_OTHER): Payer: Medicare HMO

## 2020-10-16 DIAGNOSIS — Z7901 Long term (current) use of anticoagulants: Secondary | ICD-10-CM | POA: Diagnosis not present

## 2020-10-16 DIAGNOSIS — Z952 Presence of prosthetic heart valve: Secondary | ICD-10-CM

## 2020-10-16 LAB — POCT INR: INR: 3.2 — AB (ref 2.0–3.0)

## 2020-10-16 NOTE — Patient Instructions (Signed)
Continue taking 1 tablet each Monday, Wednesday and Friday, 1.5 tablets all other days.  Repeat INR in 3 week.

## 2020-10-19 DIAGNOSIS — K219 Gastro-esophageal reflux disease without esophagitis: Secondary | ICD-10-CM | POA: Diagnosis not present

## 2020-10-19 DIAGNOSIS — E785 Hyperlipidemia, unspecified: Secondary | ICD-10-CM | POA: Diagnosis not present

## 2020-10-19 DIAGNOSIS — N029 Recurrent and persistent hematuria with unspecified morphologic changes: Secondary | ICD-10-CM | POA: Diagnosis not present

## 2020-10-19 DIAGNOSIS — I10 Essential (primary) hypertension: Secondary | ICD-10-CM | POA: Diagnosis not present

## 2020-10-25 ENCOUNTER — Ambulatory Visit (INDEPENDENT_AMBULATORY_CARE_PROVIDER_SITE_OTHER): Payer: Medicare HMO

## 2020-10-25 DIAGNOSIS — I442 Atrioventricular block, complete: Secondary | ICD-10-CM

## 2020-10-30 ENCOUNTER — Other Ambulatory Visit: Payer: Self-pay | Admitting: Cardiovascular Disease

## 2020-11-01 ENCOUNTER — Other Ambulatory Visit: Payer: Self-pay | Admitting: *Deleted

## 2020-11-01 DIAGNOSIS — I495 Sick sinus syndrome: Secondary | ICD-10-CM

## 2020-11-01 DIAGNOSIS — E7800 Pure hypercholesterolemia, unspecified: Secondary | ICD-10-CM

## 2020-11-01 DIAGNOSIS — I442 Atrioventricular block, complete: Secondary | ICD-10-CM

## 2020-11-01 LAB — CUP PACEART REMOTE DEVICE CHECK
Battery Impedance: 1760 Ohm
Battery Remaining Longevity: 45 mo
Battery Voltage: 2.77 V
Brady Statistic AP VP Percent: 0 %
Brady Statistic AP VS Percent: 79 %
Brady Statistic AS VP Percent: 0 %
Brady Statistic AS VS Percent: 21 %
Date Time Interrogation Session: 20211129105155
Implantable Lead Implant Date: 20110412
Implantable Lead Implant Date: 20110412
Implantable Lead Location: 753859
Implantable Lead Location: 753860
Implantable Lead Model: 5076
Implantable Lead Model: 5076
Implantable Pulse Generator Implant Date: 20110412
Lead Channel Impedance Value: 457 Ohm
Lead Channel Impedance Value: 619 Ohm
Lead Channel Pacing Threshold Amplitude: 0.5 V
Lead Channel Pacing Threshold Amplitude: 0.875 V
Lead Channel Pacing Threshold Pulse Width: 0.4 ms
Lead Channel Pacing Threshold Pulse Width: 0.4 ms
Lead Channel Setting Pacing Amplitude: 2 V
Lead Channel Setting Pacing Amplitude: 2 V
Lead Channel Setting Pacing Pulse Width: 0.4 ms
Lead Channel Setting Sensing Sensitivity: 2 mV

## 2020-11-02 ENCOUNTER — Telehealth: Payer: Self-pay | Admitting: Cardiovascular Disease

## 2020-11-02 DIAGNOSIS — E7800 Pure hypercholesterolemia, unspecified: Secondary | ICD-10-CM

## 2020-11-02 DIAGNOSIS — I442 Atrioventricular block, complete: Secondary | ICD-10-CM

## 2020-11-02 DIAGNOSIS — I495 Sick sinus syndrome: Secondary | ICD-10-CM

## 2020-11-02 NOTE — Telephone Encounter (Signed)
Returned call to pt notified her that Dr Lurline Del RN called her to have her get fasting lab work done after having her ECHO done on Monday. Verbalized understanding. She will call while in the lab if there are any issues.

## 2020-11-02 NOTE — Progress Notes (Signed)
Remote pacemaker transmission.   

## 2020-11-02 NOTE — Telephone Encounter (Signed)
Patient is returning call to discuss most recent remote device check.

## 2020-11-06 ENCOUNTER — Ambulatory Visit (HOSPITAL_COMMUNITY): Payer: Medicare HMO | Attending: Cardiology

## 2020-11-06 ENCOUNTER — Other Ambulatory Visit: Payer: Self-pay

## 2020-11-06 ENCOUNTER — Other Ambulatory Visit: Payer: Medicare HMO | Admitting: *Deleted

## 2020-11-06 ENCOUNTER — Ambulatory Visit (INDEPENDENT_AMBULATORY_CARE_PROVIDER_SITE_OTHER): Payer: Medicare HMO | Admitting: *Deleted

## 2020-11-06 DIAGNOSIS — Z952 Presence of prosthetic heart valve: Secondary | ICD-10-CM

## 2020-11-06 DIAGNOSIS — Z5181 Encounter for therapeutic drug level monitoring: Secondary | ICD-10-CM | POA: Diagnosis not present

## 2020-11-06 DIAGNOSIS — I442 Atrioventricular block, complete: Secondary | ICD-10-CM | POA: Diagnosis not present

## 2020-11-06 DIAGNOSIS — Z7901 Long term (current) use of anticoagulants: Secondary | ICD-10-CM | POA: Diagnosis not present

## 2020-11-06 DIAGNOSIS — I495 Sick sinus syndrome: Secondary | ICD-10-CM | POA: Diagnosis not present

## 2020-11-06 DIAGNOSIS — E7800 Pure hypercholesterolemia, unspecified: Secondary | ICD-10-CM | POA: Diagnosis not present

## 2020-11-06 LAB — POCT INR: INR: 3.1 — AB (ref 2.0–3.0)

## 2020-11-06 LAB — ECHOCARDIOGRAM COMPLETE
AV Mean grad: 8 mmHg
AV Peak grad: 14.3 mmHg
Ao pk vel: 1.89 m/s
Area-P 1/2: 3.12 cm2
S' Lateral: 2.5 cm

## 2020-11-06 NOTE — Patient Instructions (Signed)
Description    Continue taking 1 tablet each Monday, Wednesday and Friday, 1.5 tablets all other days.  Repeat INR in 4 weeks.

## 2020-11-07 LAB — LIPID PANEL
Chol/HDL Ratio: 2.3 ratio (ref 0.0–4.4)
Cholesterol, Total: 178 mg/dL (ref 100–199)
HDL: 79 mg/dL (ref >39)
LDL Chol Calc (NIH): 88 mg/dL (ref 0–99)
Triglycerides: 58 mg/dL (ref 0–149)
VLDL Cholesterol Cal: 11 mg/dL (ref 5–40)

## 2020-11-07 LAB — COMPREHENSIVE METABOLIC PANEL
ALT: 20 IU/L (ref 0–32)
AST: 29 IU/L (ref 0–40)
Albumin/Globulin Ratio: 1.6 (ref 1.2–2.2)
Albumin: 4.5 g/dL (ref 3.7–4.7)
Alkaline Phosphatase: 59 IU/L (ref 44–121)
BUN/Creatinine Ratio: 14 (ref 12–28)
BUN: 14 mg/dL (ref 8–27)
Bilirubin Total: 0.6 mg/dL (ref 0.0–1.2)
CO2: 23 mmol/L (ref 20–29)
Calcium: 10.3 mg/dL (ref 8.7–10.3)
Chloride: 102 mmol/L (ref 96–106)
Creatinine, Ser: 0.98 mg/dL (ref 0.57–1.00)
GFR calc Af Amer: 65 mL/min/{1.73_m2} (ref >59)
GFR calc non Af Amer: 56 mL/min/{1.73_m2} — ABNORMAL LOW (ref >59)
Globulin, Total: 2.8 g/dL (ref 1.5–4.5)
Glucose: 98 mg/dL (ref 65–99)
Potassium: 4.2 mmol/L (ref 3.5–5.2)
Sodium: 142 mmol/L (ref 134–144)
Total Protein: 7.3 g/dL (ref 6.0–8.5)

## 2020-11-08 ENCOUNTER — Encounter: Payer: Self-pay | Admitting: *Deleted

## 2020-11-29 DIAGNOSIS — E785 Hyperlipidemia, unspecified: Secondary | ICD-10-CM | POA: Diagnosis not present

## 2020-11-29 DIAGNOSIS — I10 Essential (primary) hypertension: Secondary | ICD-10-CM | POA: Diagnosis not present

## 2020-11-29 DIAGNOSIS — K219 Gastro-esophageal reflux disease without esophagitis: Secondary | ICD-10-CM | POA: Diagnosis not present

## 2020-11-29 DIAGNOSIS — N029 Recurrent and persistent hematuria with unspecified morphologic changes: Secondary | ICD-10-CM | POA: Diagnosis not present

## 2020-12-06 ENCOUNTER — Ambulatory Visit (INDEPENDENT_AMBULATORY_CARE_PROVIDER_SITE_OTHER): Payer: Medicare HMO

## 2020-12-06 ENCOUNTER — Other Ambulatory Visit: Payer: Self-pay

## 2020-12-06 DIAGNOSIS — Z7901 Long term (current) use of anticoagulants: Secondary | ICD-10-CM

## 2020-12-06 DIAGNOSIS — Z952 Presence of prosthetic heart valve: Secondary | ICD-10-CM

## 2020-12-06 LAB — POCT INR: INR: 3.5 — AB (ref 2.0–3.0)

## 2020-12-06 NOTE — Patient Instructions (Signed)
Continue taking 1 tablet each Monday, Wednesday and Friday, 1.5 tablets all other days.  Repeat INR in 4 weeks.

## 2020-12-07 DIAGNOSIS — I10 Essential (primary) hypertension: Secondary | ICD-10-CM | POA: Diagnosis not present

## 2020-12-07 DIAGNOSIS — D6859 Other primary thrombophilia: Secondary | ICD-10-CM | POA: Diagnosis not present

## 2020-12-07 DIAGNOSIS — I471 Supraventricular tachycardia: Secondary | ICD-10-CM | POA: Diagnosis not present

## 2020-12-07 DIAGNOSIS — Z7901 Long term (current) use of anticoagulants: Secondary | ICD-10-CM | POA: Diagnosis not present

## 2020-12-07 DIAGNOSIS — Z1389 Encounter for screening for other disorder: Secondary | ICD-10-CM | POA: Diagnosis not present

## 2020-12-07 DIAGNOSIS — Z0000 Encounter for general adult medical examination without abnormal findings: Secondary | ICD-10-CM | POA: Diagnosis not present

## 2020-12-07 DIAGNOSIS — I712 Thoracic aortic aneurysm, without rupture: Secondary | ICD-10-CM | POA: Diagnosis not present

## 2020-12-07 DIAGNOSIS — E785 Hyperlipidemia, unspecified: Secondary | ICD-10-CM | POA: Diagnosis not present

## 2020-12-07 DIAGNOSIS — Z950 Presence of cardiac pacemaker: Secondary | ICD-10-CM | POA: Diagnosis not present

## 2020-12-07 DIAGNOSIS — Z7189 Other specified counseling: Secondary | ICD-10-CM | POA: Diagnosis not present

## 2020-12-13 ENCOUNTER — Other Ambulatory Visit: Payer: Self-pay | Admitting: Cardiovascular Disease

## 2020-12-16 ENCOUNTER — Other Ambulatory Visit: Payer: Self-pay | Admitting: Internal Medicine

## 2020-12-16 DIAGNOSIS — Z1231 Encounter for screening mammogram for malignant neoplasm of breast: Secondary | ICD-10-CM

## 2020-12-16 DIAGNOSIS — E2839 Other primary ovarian failure: Secondary | ICD-10-CM

## 2020-12-29 DIAGNOSIS — K219 Gastro-esophageal reflux disease without esophagitis: Secondary | ICD-10-CM | POA: Diagnosis not present

## 2020-12-29 DIAGNOSIS — E785 Hyperlipidemia, unspecified: Secondary | ICD-10-CM | POA: Diagnosis not present

## 2020-12-29 DIAGNOSIS — I10 Essential (primary) hypertension: Secondary | ICD-10-CM | POA: Diagnosis not present

## 2020-12-29 DIAGNOSIS — N029 Recurrent and persistent hematuria with unspecified morphologic changes: Secondary | ICD-10-CM | POA: Diagnosis not present

## 2021-01-01 ENCOUNTER — Ambulatory Visit (INDEPENDENT_AMBULATORY_CARE_PROVIDER_SITE_OTHER): Payer: Medicare HMO

## 2021-01-01 ENCOUNTER — Encounter: Payer: Self-pay | Admitting: Cardiovascular Disease

## 2021-01-01 ENCOUNTER — Ambulatory Visit (INDEPENDENT_AMBULATORY_CARE_PROVIDER_SITE_OTHER): Payer: Medicare HMO | Admitting: Cardiovascular Disease

## 2021-01-01 ENCOUNTER — Other Ambulatory Visit: Payer: Self-pay

## 2021-01-01 VITALS — BP 136/81 | HR 84 | Ht 66.0 in | Wt 123.4 lb

## 2021-01-01 DIAGNOSIS — Z952 Presence of prosthetic heart valve: Secondary | ICD-10-CM

## 2021-01-01 DIAGNOSIS — Z950 Presence of cardiac pacemaker: Secondary | ICD-10-CM | POA: Diagnosis not present

## 2021-01-01 DIAGNOSIS — I712 Thoracic aortic aneurysm, without rupture: Secondary | ICD-10-CM

## 2021-01-01 DIAGNOSIS — I484 Atypical atrial flutter: Secondary | ICD-10-CM

## 2021-01-01 DIAGNOSIS — R636 Underweight: Secondary | ICD-10-CM

## 2021-01-01 DIAGNOSIS — I495 Sick sinus syndrome: Secondary | ICD-10-CM

## 2021-01-01 DIAGNOSIS — Z7901 Long term (current) use of anticoagulants: Secondary | ICD-10-CM

## 2021-01-01 DIAGNOSIS — E7800 Pure hypercholesterolemia, unspecified: Secondary | ICD-10-CM | POA: Diagnosis not present

## 2021-01-01 DIAGNOSIS — I453 Trifascicular block: Secondary | ICD-10-CM

## 2021-01-01 DIAGNOSIS — I7121 Aneurysm of the ascending aorta, without rupture: Secondary | ICD-10-CM

## 2021-01-01 LAB — POCT INR: INR: 5.2 — AB (ref 2.0–3.0)

## 2021-01-01 NOTE — Progress Notes (Signed)
Cardiology Office Note    Date:  01/01/2021   ID:  [name], [name] [birth year], MRN [id number]  PCP:  Leeroy Cha, MD  Cardiologist:   Sanda Klein, MD   Chief complaint: pacemaker and prosthetic valve check  History of Present Illness:  [name] [name] is a 75 y.o. female who is almost 10 years status post Bentall repair of a small ascending aortic aneurysm at the time of aortic valve replacement with a mechanical prosthesis for bicuspid aortic valve with severe aortic stenosis. She received a dual-chamber permanent pacemaker (Medtronic Adapta dual-chamber, 2011) at the time of her surgery for what appeared to be permanent AV block, but has subsequently recovered normal AV conduction. She then developed findings of sinus node dysfunction and requires atrial pacing. She had normal coronary arteries at the time of angiography preoperatively. She just had a follow up echo in December with favorable findings.  The patient specifically denies any chest pain at rest exertion, dyspnea at rest or with exertion, orthopnea, paroxysmal nocturnal dyspnea, syncope, palpitations, focal neurological deficits, intermittent claudication, lower extremity edema, unexplained weight gain, cough, hemoptysis or wheezing. Still very lean, now BMI just under 20. Reports eating poorly because she worries about the poor health of her 18 year old dog. No falls or bleeding. INR is almost always in range, but today was high at 5.7.  Pacemaker interrogation shows estimated generator longevity of 3.5 years. There has been no atrial fibrillation and no ventricular tachycardia. Has 77 % atrial pacing but only 0.1 % ventricular pacing. Heart rate histogram distribution is good, with a slight second peak at 90-100, but overall acceptable.  She had a 5-minute episode of atrial flutter in July and had NSVT in September (device reports as a 14 second event, but actually two consecutive 11-beat and 8-beat bursts,  separated by a couple of normal beats) .  Both leads are Medtronic 5076 leads and have excellent parameters.  Past Medical History:  Diagnosis Date  . Aortic stenosis 03/07/2010   replaced mechanical valve Bentall procedure\  . Atrial fibrillation ([place])   . Coronary artery disease    Dr. Avon Gully  . Dyslipidemia   . Hypertension   . 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 Presence of permanent cardiac pacemaker   . S/P AAA repair 03/07/2010  . Systemic hypertension     Past Surgical History:  Procedure Laterality Date  . ABDOMINAL AORTIC ANEURYSM REPAIR  03/07/2010  . AORTIC VALVE REPLACEMENT  03/07/2010   mechanical valve Bentall procedure  . CARDIAC CATHETERIZATION  08/25/2009   severe AS, mild CAD  . COLONOSCOPY WITH PROPOFOL N/A 09/12/2015   Procedure: COLONOSCOPY WITH PROPOFOL;  Surgeon: Juanita Craver, MD;  Location: [facility];  Service: Endoscopy;  Laterality: N/A;  . PACEMAKER INSERTION  03/13/2010   Medtronic Adapta  . US ECHOCARDIOGRAPHY  08/09/2011   mod. concentric LVH,LA mildly dilated,ca+ MV,trace TR and AI,mechanical AOV    Current Medications: Outpatient Medications Prior to Visit  Medication Sig Dispense Refill  . atorvastatin (LIPITOR) 40 MG tablet TAKE 1 TABLET EVERY DAY (NEED AN APPOINTMENT FOR FUTURE REFILLS) 90 tablet 3  . Calcium Carbonate (CALCARB 600 PO) Take 1 tablet by mouth daily.    . clindamycin (CLEOCIN) 150 MG capsule Take 3 capsules by mouth as directed. For dental procedure/cleanings    . hydrochlorothiazide (HYDRODIURIL) 25 MG tablet TAKE 1 TABLET EVERY OTHER DAY 45 tablet 3  . lisinopril (ZESTRIL) 20 MG tablet TAKE 1 TABLET  EVERY DAY 90 tablet 3  . metoprolol tartrate (LOPRESSOR) 50 MG tablet TAKE 1 TABLET TWICE DAILY 180 tablet 3  . Multiple Vitamin (MULITIVITAMIN WITH MINERALS) TABS Take 1 tablet by mouth daily.    Marland Kitchen warfarin (COUMADIN) 2.5 MG tablet TAKE 1 TO 1 AND 1/2 TABLETS DAILY AS DIRECTED BY COUMADIN CLINIC 135 tablet 1   No  facility-administered medications prior to visit.     Allergies:   Crestor [rosuvastatin calcium], Latex, and Penicillins   Social History   Socioeconomic History  . Marital status: Widowed    Spouse name: Not on file  . Number of children: Not on file  . Years of education: Not on file  . Highest education level: Not on file  Occupational History  . Not on file  Tobacco Use  . Smoking status: Former Smoker    Quit date: 12/02/2009    Years since quitting: 11.0  . Smokeless tobacco: Never Used  Substance and Sexual Activity  . Alcohol use: No  . Drug use: No  . Sexual activity: Never  Other Topics Concern  . Not on file  Social History Narrative  . Not on file   Social Determinants of Health   Financial Resource Strain: Not on file  Food Insecurity: Not on file  Transportation Needs: Not on file  Physical Activity: Not on file  Stress: Not on file  Social Connections: Not on file     Family History:  The patient's family history includes Diabetes in her brother and mother; Heart failure in her brother; Hypertension in her brother and mother.   ROS:   Please see the history of present illness.    ROS All other systems are reviewed and are negative.   PHYSICAL EXAM:   VS:  BP 136/81   Pulse 84   Ht 5\' 6"  (1.676 m)   Wt 123 lb 6.4 oz (56 kg)   SpO2 97%   BMI 19.92 kg/m       General: Alert, oriented x3, no distress, very lean Head: no evidence of trauma, PERRL, EOMI, no exophtalmos or lid lag, no myxedema, no xanthelasma; normal ears, nose and oropharynx Neck: normal jugular venous pulsations and no hepatojugular reflux; brisk carotid pulses without delay and no carotid bruits Chest: clear to auscultation, no signs of consolidation by percussion or palpation, normal fremitus, symmetrical and full respiratory excursions Cardiovascular: normal position and quality of the apical impulse, regular rhythm, normal first and second heart sounds, no murmurs, rubs or  gallops Abdomen: no tenderness or distention, no masses by palpation, no abnormal pulsatility or arterial bruits, normal bowel sounds, no hepatosplenomegaly Extremities: no clubbing, cyanosis or edema; 2+ radial, ulnar and brachial pulses bilaterally; 2+ right femoral, posterior tibial and dorsalis pedis pulses; 2+ left femoral, posterior tibial and dorsalis pedis pulses; no subclavian or femoral bruits Neurological: grossly nonfocal Psych: Normal mood and affect   Wt Readings from Last 3 Encounters:  01/01/21 123 lb 6.4 oz (56 kg)  11/08/19 126 lb 14.4 oz (57.6 kg)  11/06/18 135 lb 9.6 oz (61.5 kg)      Studies/Labs Reviewed:   ECHO 11/06/2020: 1. Left ventricular ejection fraction, by estimation, is 65 to 70%. The  left ventricle has normal function. The left ventricle has no regional  wall motion abnormalities. There is mild left ventricular hypertrophy.  Left ventricular diastolic parameters  are consistent with Grade I diastolic dysfunction (impaired relaxation).  2. Right ventricular systolic function is mildly reduced. The right  ventricular  size is normal. There is mildly elevated pulmonary artery  systolic pressure. The estimated right ventricular systolic pressure is  46.2 mmHg.  3. The mitral valve is normal in structure. Trivial mitral valve  regurgitation. No evidence of mitral stenosis.  4. Mechanical aortic valve, 23 mm Carbomedics. Mean gradient 8 mmHg with  trivial regurgitation. Normally functioning prosthetic valve.  5. S/p Bentall. Aortic dilatation noted. There is mild dilatation of the  ascending aorta, measuring 38 mm.  6. Left atrial size was mildly dilated.  7. Right atrial size was mildly dilated.  8. The inferior vena cava is normal in size with <50% respiratory  variability, suggesting right atrial pressure of 8 mmHg.   Comparison(s): 11/19/16 EF 60-65%. PA pressure 39mmHg. AV 66mmHg mean PG,  47mmHg peak PG.  EKG:  EKG is ordered today.  It  shows A paced, V sensed rhythm with long AV delay (289 ms), RBBB+LAFB ("trifascicular block").QTc 486 ms Recent Labs: Labs from October 21, 2017 at [facility] Normal LFTs, BUN 19, creatinine 0.9 by, potassium 3.6, hemoglobin 11.5 Total cholesterol 179, HDL 79, LDL 86, triglycerides 68 glucose 92   10/26/2018 Total cholesterol 155, HDL 58, LDL 79, triglycerides 87 Hemoglobin 10.9, creatinine 0.97, potassium 4.8, normal liver function tests  11/06/2020 Total cholesterol 178, HDL 79, LDL 88, triglycerides 58 Hemoglobin 11.8, creatinine 0.98, potassium 4.2, normal liver function tests  ASSESSMENT:    1. SSS (sick sinus syndrome) ([place])   2. Trifascicular block   3. Pacemaker   4. H/O aortic valve replacement, 23 mm Carbomedics   5. Ascending aortic aneurysm s/p Bentall repair 2011   6. Hypercholesterolemia   7. Atypical atrial flutter ([place])   8. Underweight     PLAN:  In order of problems listed above:  1. SSS: Very similar to a year ago. Mostly atrial paced rhythm, heart rate histogram distribution appears appropriate, although there is a small second peak at about 100 bpm.  No changes were made to device settings today. 2. Tri-/Bifascicular block: with "MVP" programming, <0.2% V pacing. 3. PPM: Normal device function.  Continue remote downloads every 3 months. 4. AVR: Her most recent echocardiogram was in 2017 and needs to be updated.  Will delay until the coronavirus pandemic is under little better control and check it in late 2021. 5. Ao Aneurysm s/p repair: related to bicuspid valve.  Stable root size on echo. 6. HLP: on statin, all parameters acceptable.  She does not have known coronary or peripheral vascular disease. 7. AFlutter: rare, brief an asymptomatic. On warfarin for the AVR. 8. Underweight: Losing weight again. Reports that she got up to 129 lb this summer, then lost again.  Advised increased intake of calories and protein.  Medication Adjustments/Labs and  Tests Ordered: Current medicines are reviewed at length with the patient today.  Concerns regarding medicines are outlined above.  Medication changes, Labs and Tests ordered today are listed in the Patient Instructions below. Patient Instructions  Medication Instructions:  No changes *If you need a refill on your cardiac medications before your next appointment, please call your pharmacy*   Lab Work: None ordered If you have labs (blood work) drawn today and your tests are completely normal, you will receive your results only by: Marland Kitchen MyChart Message (if you have MyChart) OR . A paper copy in the mail If you have any lab test that is abnormal or we need to change your treatment, we will call you to review the results.   Testing/Procedures: None  ordered   Follow-Up: At [facility], you and your health needs are our priority.  As part of our continuing mission to provide you with exceptional heart care, we have created designated Provider Care Teams.  These Care Teams include your primary Cardiologist (physician) and Advanced Practice Providers (APPs -  Physician Assistants and Nurse Practitioners) who all work together to provide you with the care you need, when you need it.  We recommend signing up for the patient portal called "MyChart".  Sign up information is provided on this After Visit Summary.  MyChart is used to connect with patients for Virtual Visits (Telemedicine).  Patients are able to view lab/test results, encounter notes, upcoming appointments, etc.  Non-urgent messages can be sent to your provider as well.   To learn more about what you can do with MyChart, go to [email or web address].    Your next appointment:   12 month(s)  The format for your next appointment:   In Person  Provider:   Sanda Klein, MD       Signed, Sanda Klein, MD  01/01/2021 1:48 PM    [facility] [address], [place], [place]  [address] Phone: [phone]; Fax: [phone]

## 2021-01-01 NOTE — Patient Instructions (Signed)
Hold today and tomorrow and then Continue taking 1 tablet each Monday, Wednesday and Friday, 1.5 tablets all other days.  Repeat INR in 4 weeks.

## 2021-01-01 NOTE — Patient Instructions (Signed)

## 2021-01-24 ENCOUNTER — Ambulatory Visit (INDEPENDENT_AMBULATORY_CARE_PROVIDER_SITE_OTHER): Payer: Medicare HMO

## 2021-01-24 DIAGNOSIS — I495 Sick sinus syndrome: Secondary | ICD-10-CM | POA: Diagnosis not present

## 2021-01-25 LAB — CUP PACEART REMOTE DEVICE CHECK
Battery Impedance: 1848 Ohm
Battery Remaining Longevity: 44 mo
Battery Voltage: 2.76 V
Brady Statistic AP VP Percent: 0 %
Brady Statistic AP VS Percent: 69 %
Brady Statistic AS VP Percent: 0 %
Brady Statistic AS VS Percent: 31 %
Date Time Interrogation Session: 20220224013724
Implantable Lead Implant Date: 20110412
Implantable Lead Implant Date: 20110412
Implantable Lead Location: 753859
Implantable Lead Location: 753860
Implantable Lead Model: 5076
Implantable Lead Model: 5076
Implantable Pulse Generator Implant Date: 20110412
Lead Channel Impedance Value: 483 Ohm
Lead Channel Impedance Value: 650 Ohm
Lead Channel Pacing Threshold Amplitude: 0.5 V
Lead Channel Pacing Threshold Amplitude: 0.875 V
Lead Channel Pacing Threshold Pulse Width: 0.4 ms
Lead Channel Pacing Threshold Pulse Width: 0.4 ms
Lead Channel Setting Pacing Amplitude: 2 V
Lead Channel Setting Pacing Amplitude: 2 V
Lead Channel Setting Pacing Pulse Width: 0.4 ms
Lead Channel Setting Sensing Sensitivity: 2 mV

## 2021-01-26 DIAGNOSIS — E785 Hyperlipidemia, unspecified: Secondary | ICD-10-CM | POA: Diagnosis not present

## 2021-01-26 DIAGNOSIS — N029 Recurrent and persistent hematuria with unspecified morphologic changes: Secondary | ICD-10-CM | POA: Diagnosis not present

## 2021-01-26 DIAGNOSIS — I10 Essential (primary) hypertension: Secondary | ICD-10-CM | POA: Diagnosis not present

## 2021-01-26 DIAGNOSIS — K219 Gastro-esophageal reflux disease without esophagitis: Secondary | ICD-10-CM | POA: Diagnosis not present

## 2021-01-29 ENCOUNTER — Ambulatory Visit (INDEPENDENT_AMBULATORY_CARE_PROVIDER_SITE_OTHER): Payer: Medicare HMO

## 2021-01-29 ENCOUNTER — Other Ambulatory Visit: Payer: Self-pay

## 2021-01-29 DIAGNOSIS — Z7901 Long term (current) use of anticoagulants: Secondary | ICD-10-CM | POA: Diagnosis not present

## 2021-01-29 DIAGNOSIS — Z952 Presence of prosthetic heart valve: Secondary | ICD-10-CM | POA: Diagnosis not present

## 2021-01-29 LAB — POCT INR: INR: 4.3 — AB (ref 2.0–3.0)

## 2021-01-29 NOTE — Patient Instructions (Signed)
Hold today and then decrease to 1 tablet daily except 1.5 tablets on Tuesday and Thursday.  Repeat INR in 2 weeks.

## 2021-02-02 NOTE — Progress Notes (Signed)
Remote pacemaker transmission.   

## 2021-02-12 ENCOUNTER — Ambulatory Visit (INDEPENDENT_AMBULATORY_CARE_PROVIDER_SITE_OTHER): Payer: Medicare HMO

## 2021-02-12 ENCOUNTER — Other Ambulatory Visit: Payer: Self-pay

## 2021-02-12 DIAGNOSIS — Z952 Presence of prosthetic heart valve: Secondary | ICD-10-CM

## 2021-02-12 DIAGNOSIS — Z7901 Long term (current) use of anticoagulants: Secondary | ICD-10-CM | POA: Diagnosis not present

## 2021-02-12 LAB — POCT INR: INR: 3.6 — AB (ref 2.0–3.0)

## 2021-02-12 NOTE — Patient Instructions (Signed)
Continue 1 tablet daily except 1.5 tablets on Tuesday and Thursday.  Repeat INR in 3 weeks.

## 2021-02-20 ENCOUNTER — Other Ambulatory Visit: Payer: Self-pay

## 2021-02-20 ENCOUNTER — Ambulatory Visit
Admission: RE | Admit: 2021-02-20 | Discharge: 2021-02-20 | Disposition: A | Discharge status: Home / Self Care | Payer: Medicare HMO | Source: Ambulatory Visit | Attending: Internal Medicine | Admitting: Internal Medicine

## 2021-02-20 DIAGNOSIS — Z1231 Encounter for screening mammogram for malignant neoplasm of breast: Secondary | ICD-10-CM | POA: Diagnosis not present

## 2021-02-22 ENCOUNTER — Telehealth: Payer: Self-pay

## 2021-02-22 NOTE — Telephone Encounter (Signed)
medtronic is sending her a new cord for her monitor.

## 2021-03-05 ENCOUNTER — Ambulatory Visit (INDEPENDENT_AMBULATORY_CARE_PROVIDER_SITE_OTHER): Payer: Medicare HMO

## 2021-03-05 ENCOUNTER — Other Ambulatory Visit: Payer: Self-pay

## 2021-03-05 DIAGNOSIS — Z7901 Long term (current) use of anticoagulants: Secondary | ICD-10-CM

## 2021-03-05 DIAGNOSIS — Z952 Presence of prosthetic heart valve: Secondary | ICD-10-CM | POA: Diagnosis not present

## 2021-03-05 LAB — POCT INR: INR: 4.5 — AB (ref 2.0–3.0)

## 2021-03-05 NOTE — Patient Instructions (Signed)
Hold tonight only and then decrease to 1 tablet daily except 1.5 tablets on Tuesday.  Repeat INR in 3 weeks.

## 2021-03-13 DIAGNOSIS — E785 Hyperlipidemia, unspecified: Secondary | ICD-10-CM | POA: Diagnosis not present

## 2021-03-13 DIAGNOSIS — K219 Gastro-esophageal reflux disease without esophagitis: Secondary | ICD-10-CM | POA: Diagnosis not present

## 2021-03-13 DIAGNOSIS — N029 Recurrent and persistent hematuria with unspecified morphologic changes: Secondary | ICD-10-CM | POA: Diagnosis not present

## 2021-03-13 DIAGNOSIS — I10 Essential (primary) hypertension: Secondary | ICD-10-CM | POA: Diagnosis not present

## 2021-03-26 ENCOUNTER — Ambulatory Visit (INDEPENDENT_AMBULATORY_CARE_PROVIDER_SITE_OTHER): Payer: Medicare HMO

## 2021-03-26 ENCOUNTER — Other Ambulatory Visit: Payer: Self-pay

## 2021-03-26 DIAGNOSIS — Z952 Presence of prosthetic heart valve: Secondary | ICD-10-CM | POA: Diagnosis not present

## 2021-03-26 DIAGNOSIS — Z7901 Long term (current) use of anticoagulants: Secondary | ICD-10-CM

## 2021-03-26 LAB — POCT INR: INR: 7.3 — AB (ref 2.0–3.0)

## 2021-03-26 NOTE — Patient Instructions (Signed)
Hold Monday, Tuesday, Wednesday and Thursday and then continue taking 1 tablet daily except 1.5 tablets on Tuesday.  Repeat Friday 4/29.

## 2021-03-28 ENCOUNTER — Other Ambulatory Visit: Payer: Self-pay | Admitting: Cardiovascular Disease

## 2021-03-30 ENCOUNTER — Other Ambulatory Visit: Payer: Self-pay

## 2021-03-30 ENCOUNTER — Ambulatory Visit (INDEPENDENT_AMBULATORY_CARE_PROVIDER_SITE_OTHER): Payer: Medicare HMO

## 2021-03-30 DIAGNOSIS — Z952 Presence of prosthetic heart valve: Secondary | ICD-10-CM

## 2021-03-30 DIAGNOSIS — Z7901 Long term (current) use of anticoagulants: Secondary | ICD-10-CM | POA: Diagnosis not present

## 2021-03-30 LAB — POCT INR: INR: 4.6 — AB (ref 2.0–3.0)

## 2021-03-30 NOTE — Patient Instructions (Signed)
Hold tonight only and then decrease to 1 tablet daily. Repeat in 4 weeks.

## 2021-04-10 ENCOUNTER — Ambulatory Visit
Admission: RE | Admit: 2021-04-10 | Discharge: 2021-04-10 | Disposition: A | Discharge status: Home / Self Care | Payer: Medicare HMO | Source: Ambulatory Visit | Attending: Internal Medicine | Admitting: Internal Medicine

## 2021-04-10 ENCOUNTER — Other Ambulatory Visit: Payer: Self-pay | Admitting: Internal Medicine

## 2021-04-10 DIAGNOSIS — M546 Pain in thoracic spine: Secondary | ICD-10-CM

## 2021-04-10 DIAGNOSIS — M47814 Spondylosis without myelopathy or radiculopathy, thoracic region: Secondary | ICD-10-CM | POA: Diagnosis not present

## 2021-04-10 DIAGNOSIS — I10 Essential (primary) hypertension: Secondary | ICD-10-CM | POA: Diagnosis not present

## 2021-04-10 DIAGNOSIS — R634 Abnormal weight loss: Secondary | ICD-10-CM | POA: Diagnosis not present

## 2021-04-10 DIAGNOSIS — I471 Supraventricular tachycardia: Secondary | ICD-10-CM | POA: Diagnosis not present

## 2021-04-10 DIAGNOSIS — I712 Thoracic aortic aneurysm, without rupture: Secondary | ICD-10-CM | POA: Diagnosis not present

## 2021-04-10 DIAGNOSIS — Z7901 Long term (current) use of anticoagulants: Secondary | ICD-10-CM | POA: Diagnosis not present

## 2021-04-10 DIAGNOSIS — E785 Hyperlipidemia, unspecified: Secondary | ICD-10-CM | POA: Diagnosis not present

## 2021-04-10 DIAGNOSIS — R630 Anorexia: Secondary | ICD-10-CM | POA: Diagnosis not present

## 2021-04-10 DIAGNOSIS — G8929 Other chronic pain: Secondary | ICD-10-CM | POA: Diagnosis not present

## 2021-04-12 ENCOUNTER — Other Ambulatory Visit: Payer: Self-pay | Admitting: Internal Medicine

## 2021-04-12 DIAGNOSIS — E785 Hyperlipidemia, unspecified: Secondary | ICD-10-CM | POA: Diagnosis not present

## 2021-04-12 DIAGNOSIS — N029 Recurrent and persistent hematuria with unspecified morphologic changes: Secondary | ICD-10-CM | POA: Diagnosis not present

## 2021-04-12 DIAGNOSIS — I10 Essential (primary) hypertension: Secondary | ICD-10-CM | POA: Diagnosis not present

## 2021-04-12 DIAGNOSIS — K219 Gastro-esophageal reflux disease without esophagitis: Secondary | ICD-10-CM | POA: Diagnosis not present

## 2021-04-12 DIAGNOSIS — R748 Abnormal levels of other serum enzymes: Secondary | ICD-10-CM

## 2021-04-12 DIAGNOSIS — G8929 Other chronic pain: Secondary | ICD-10-CM | POA: Diagnosis not present

## 2021-04-17 ENCOUNTER — Other Ambulatory Visit: Payer: Self-pay

## 2021-04-17 ENCOUNTER — Encounter (HOSPITAL_COMMUNITY): Payer: Self-pay | Admitting: Emergency Medicine

## 2021-04-17 ENCOUNTER — Inpatient Hospital Stay (HOSPITAL_COMMUNITY)
Admission: EM | Admit: 2021-04-17 | Discharge: 2021-04-20 | DRG: 640 | Disposition: A | Discharge status: Home / Self Care | Payer: Medicare HMO | Attending: Internal Medicine | Admitting: Internal Medicine

## 2021-04-17 ENCOUNTER — Emergency Department (HOSPITAL_COMMUNITY): Payer: Medicare HMO

## 2021-04-17 DIAGNOSIS — E861 Hypovolemia: Secondary | ICD-10-CM | POA: Diagnosis present

## 2021-04-17 DIAGNOSIS — R1111 Vomiting without nausea: Secondary | ICD-10-CM | POA: Diagnosis not present

## 2021-04-17 DIAGNOSIS — C7989 Secondary malignant neoplasm of other specified sites: Secondary | ICD-10-CM | POA: Diagnosis not present

## 2021-04-17 DIAGNOSIS — Z952 Presence of prosthetic heart valve: Secondary | ICD-10-CM

## 2021-04-17 DIAGNOSIS — K5900 Constipation, unspecified: Secondary | ICD-10-CM

## 2021-04-17 DIAGNOSIS — Z950 Presence of cardiac pacemaker: Secondary | ICD-10-CM

## 2021-04-17 DIAGNOSIS — Z7901 Long term (current) use of anticoagulants: Secondary | ICD-10-CM | POA: Diagnosis not present

## 2021-04-17 DIAGNOSIS — R079 Chest pain, unspecified: Secondary | ICD-10-CM

## 2021-04-17 DIAGNOSIS — I10 Essential (primary) hypertension: Secondary | ICD-10-CM | POA: Diagnosis present

## 2021-04-17 DIAGNOSIS — K8689 Other specified diseases of pancreas: Secondary | ICD-10-CM | POA: Diagnosis not present

## 2021-04-17 DIAGNOSIS — I442 Atrioventricular block, complete: Secondary | ICD-10-CM | POA: Diagnosis present

## 2021-04-17 DIAGNOSIS — K769 Liver disease, unspecified: Secondary | ICD-10-CM | POA: Diagnosis not present

## 2021-04-17 DIAGNOSIS — R791 Abnormal coagulation profile: Secondary | ICD-10-CM | POA: Diagnosis not present

## 2021-04-17 DIAGNOSIS — I2510 Atherosclerotic heart disease of native coronary artery without angina pectoris: Secondary | ICD-10-CM | POA: Diagnosis present

## 2021-04-17 DIAGNOSIS — J984 Other disorders of lung: Secondary | ICD-10-CM | POA: Diagnosis not present

## 2021-04-17 DIAGNOSIS — R531 Weakness: Secondary | ICD-10-CM | POA: Diagnosis not present

## 2021-04-17 DIAGNOSIS — D259 Leiomyoma of uterus, unspecified: Secondary | ICD-10-CM | POA: Diagnosis not present

## 2021-04-17 DIAGNOSIS — E785 Hyperlipidemia, unspecified: Secondary | ICD-10-CM | POA: Diagnosis present

## 2021-04-17 DIAGNOSIS — N200 Calculus of kidney: Secondary | ICD-10-CM | POA: Diagnosis not present

## 2021-04-17 DIAGNOSIS — Z79899 Other long term (current) drug therapy: Secondary | ICD-10-CM | POA: Diagnosis not present

## 2021-04-17 DIAGNOSIS — C787 Secondary malignant neoplasm of liver and intrahepatic bile duct: Secondary | ICD-10-CM

## 2021-04-17 DIAGNOSIS — D735 Infarction of spleen: Secondary | ICD-10-CM | POA: Diagnosis not present

## 2021-04-17 DIAGNOSIS — Z515 Encounter for palliative care: Secondary | ICD-10-CM

## 2021-04-17 DIAGNOSIS — R195 Other fecal abnormalities: Secondary | ICD-10-CM | POA: Diagnosis not present

## 2021-04-17 DIAGNOSIS — C801 Malignant (primary) neoplasm, unspecified: Secondary | ICD-10-CM | POA: Diagnosis present

## 2021-04-17 DIAGNOSIS — Z888 Allergy status to other drugs, medicaments and biological substances status: Secondary | ICD-10-CM | POA: Diagnosis not present

## 2021-04-17 DIAGNOSIS — Z681 Body mass index (BMI) 19 or less, adult: Secondary | ICD-10-CM | POA: Diagnosis not present

## 2021-04-17 DIAGNOSIS — C799 Secondary malignant neoplasm of unspecified site: Secondary | ICD-10-CM

## 2021-04-17 DIAGNOSIS — J439 Emphysema, unspecified: Secondary | ICD-10-CM | POA: Diagnosis not present

## 2021-04-17 DIAGNOSIS — I471 Supraventricular tachycardia: Secondary | ICD-10-CM | POA: Diagnosis not present

## 2021-04-17 DIAGNOSIS — K7689 Other specified diseases of liver: Secondary | ICD-10-CM | POA: Diagnosis not present

## 2021-04-17 DIAGNOSIS — Z8249 Family history of ischemic heart disease and other diseases of the circulatory system: Secondary | ICD-10-CM

## 2021-04-17 DIAGNOSIS — E43 Unspecified severe protein-calorie malnutrition: Secondary | ICD-10-CM | POA: Diagnosis present

## 2021-04-17 DIAGNOSIS — E871 Hypo-osmolality and hyponatremia: Principal | ICD-10-CM | POA: Diagnosis present

## 2021-04-17 DIAGNOSIS — I82890 Acute embolism and thrombosis of other specified veins: Secondary | ICD-10-CM | POA: Diagnosis not present

## 2021-04-17 DIAGNOSIS — Z880 Allergy status to penicillin: Secondary | ICD-10-CM | POA: Diagnosis not present

## 2021-04-17 DIAGNOSIS — C259 Malignant neoplasm of pancreas, unspecified: Secondary | ICD-10-CM | POA: Diagnosis present

## 2021-04-17 DIAGNOSIS — Z87891 Personal history of nicotine dependence: Secondary | ICD-10-CM | POA: Diagnosis not present

## 2021-04-17 DIAGNOSIS — Z20822 Contact with and (suspected) exposure to covid-19: Secondary | ICD-10-CM | POA: Diagnosis not present

## 2021-04-17 DIAGNOSIS — I712 Thoracic aortic aneurysm, without rupture: Secondary | ICD-10-CM | POA: Diagnosis present

## 2021-04-17 DIAGNOSIS — Z91040 Latex allergy status: Secondary | ICD-10-CM

## 2021-04-17 LAB — COMPREHENSIVE METABOLIC PANEL
ALT: 28 U/L (ref 0–44)
AST: 40 U/L (ref 15–41)
Albumin: 2.9 g/dL — ABNORMAL LOW (ref 3.5–5.0)
Alkaline Phosphatase: 147 U/L — ABNORMAL HIGH (ref 38–126)
Anion gap: 10 (ref 5–15)
BUN: 15 mg/dL (ref 8–23)
CO2: 27 mmol/L (ref 22–32)
Calcium: 9.3 mg/dL (ref 8.9–10.3)
Chloride: 92 mmol/L — ABNORMAL LOW (ref 98–111)
Creatinine, Ser: 0.8 mg/dL (ref 0.44–1.00)
GFR, Estimated: >60 mL/min (ref >60)
Glucose, Bld: 120 mg/dL — ABNORMAL HIGH (ref 70–99)
Potassium: 4.4 mmol/L (ref 3.5–5.1)
Sodium: 129 mmol/L — ABNORMAL LOW (ref 135–145)
Total Bilirubin: 1.1 mg/dL (ref 0.3–1.2)
Total Protein: 6.3 g/dL — ABNORMAL LOW (ref 6.5–8.1)

## 2021-04-17 LAB — URINALYSIS, ROUTINE W REFLEX MICROSCOPIC
Bacteria, UA: NONE SEEN
Bilirubin Urine: NEGATIVE
Glucose, UA: NEGATIVE mg/dL
Hgb urine dipstick: NEGATIVE
Ketones, ur: NEGATIVE mg/dL
Leukocytes,Ua: NEGATIVE
Nitrite: NEGATIVE
Protein, ur: 30 mg/dL — AB
Specific Gravity, Urine: 1.019 (ref 1.005–1.030)
pH: 7 (ref 5.0–8.0)

## 2021-04-17 LAB — CBC WITH DIFFERENTIAL/PLATELET
Abs Immature Granulocytes: 0.18 10*3/uL — ABNORMAL HIGH (ref 0.00–0.07)
Basophils Absolute: 0.1 10*3/uL (ref 0.0–0.1)
Basophils Relative: 1 %
Eosinophils Absolute: 0.4 10*3/uL (ref 0.0–0.5)
Eosinophils Relative: 2 %
HCT: 34.6 % — ABNORMAL LOW (ref 36.0–46.0)
Hemoglobin: 11.2 g/dL — ABNORMAL LOW (ref 12.0–15.0)
Immature Granulocytes: 1 %
Lymphocytes Relative: 6 %
Lymphs Abs: 0.9 10*3/uL (ref 0.7–4.0)
MCH: 27.8 pg (ref 26.0–34.0)
MCHC: 32.4 g/dL (ref 30.0–36.0)
MCV: 85.9 fL (ref 80.0–100.0)
Monocytes Absolute: 1.1 10*3/uL — ABNORMAL HIGH (ref 0.1–1.0)
Monocytes Relative: 7 %
Neutro Abs: 12.8 10*3/uL — ABNORMAL HIGH (ref 1.7–7.7)
Neutrophils Relative %: 83 %
Platelets: 305 10*3/uL (ref 150–400)
RBC: 4.03 MIL/uL (ref 3.87–5.11)
RDW: 14.3 % (ref 11.5–15.5)
WBC: 15.4 10*3/uL — ABNORMAL HIGH (ref 4.0–10.5)
nRBC: 0 % (ref 0.0–0.2)

## 2021-04-17 LAB — LIPASE, BLOOD: Lipase: 37 U/L (ref 11–51)

## 2021-04-17 LAB — PROTIME-INR
INR: >10 (ref 0.8–1.2)
Prothrombin Time: >90 seconds — ABNORMAL HIGH (ref 11.4–15.2)

## 2021-04-17 NOTE — ED Provider Notes (Signed)
[facility] EMERGENCY DEPARTMENT Provider Note   CSN: [id number] Arrival date & time: 04/17/21  1751     History Chief Complaint  Patient presents with  . Weakness  . Loss of Appetite    Brylie Sneath is a 75 y.o. female.  39 75 year old female who presents the emerge department today secondary to weight loss.  Patient states that she has a decreased appetite for about the last month or month and a half.  She is lost approximate 20 pounds during that time.  She states this is some dark stools.  She came in earlier to be evaluated for this but then left.  She was called back because her INR was greater than 10.  She states compliance with her Coumadin and she did have high INR couple months ago but has since adjusted her medications. Last colonoscopy about 7-8 years ago.         Past Medical History:  Diagnosis Date  . Aortic stenosis 03/07/2010   replaced mechanical valve Bentall procedure\  . Atrial fibrillation ([place])   . Coronary artery disease    Dr. Avon Gully  . Dyslipidemia   . Hypertension   . Intermittent complete heart block ([place])    H/O  . Presence of permanent cardiac pacemaker   . S/P AAA repair 03/07/2010  . Systemic hypertension     Patient Active Problem List   Diagnosis Date Noted  . Liver lesion 04/18/2021  . Hyponatremia 04/18/2021  . Supratherapeutic INR 04/17/2021  . SSS (sick sinus syndrome) ([place]) 11/06/2018  . S/P ascending aortic aneurysm repair 11/06/2018  . Bifascicular block 10/22/2016  . Ascending aortic aneurysm s/p Bentall repair 2011 07/27/2013  . Complete heart block ([place]) 07/27/2013  . Pacemaker, dual chamber Medtronic Adapta April 2011 07/27/2013  . Paroxysmal atrial tachycardia ([place]) 07/27/2013  . Hypercholesterolemia 07/27/2013  . Long term current use of anticoagulant therapy 02/17/2013  . H/O aortic valve replacement, 23 mm Carbomedics 02/17/2013    Past Surgical History:  Procedure Laterality Date  .  ABDOMINAL AORTIC ANEURYSM REPAIR  03/07/2010  . AORTIC VALVE REPLACEMENT  03/07/2010   mechanical valve Bentall procedure  . CARDIAC CATHETERIZATION  08/25/2009   severe AS, mild CAD  . COLONOSCOPY WITH PROPOFOL N/A 09/12/2015   Procedure: COLONOSCOPY WITH PROPOFOL;  Surgeon: Juanita Craver, MD;  Location: [facility];  Service: Endoscopy;  Laterality: N/A;  . PACEMAKER INSERTION  03/13/2010   Medtronic Adapta  . US ECHOCARDIOGRAPHY  08/09/2011   mod. concentric LVH,LA mildly dilated,ca+ MV,trace TR and AI,mechanical AOV     OB History   No obstetric history on file.     Family History  Problem Relation Age of Onset  . Diabetes Mother   . Hypertension Mother   . Heart failure Brother   . Hypertension Brother   . Diabetes Brother     Social History   Tobacco Use  . Smoking status: Former Smoker    Quit date: 12/02/2009    Years since quitting: 11.3  . Smokeless tobacco: Never Used  Substance Use Topics  . Alcohol use: No  . Drug use: No    Home Medications Prior to Admission medications   Medication Sig Start Date End Date Taking? Authorizing Provider  acetaminophen (TYLENOL) 325 MG tablet Take 325 mg by mouth every 6 (six) hours as needed for moderate pain or headache.   Yes [provider]  atorvastatin (LIPITOR) 40 MG tablet Take 1 tablet (40 mg total) by mouth daily.  03/28/21  Yes Croitoru, Mihai, MD  Calcium Carbonate (CALCARB 600 PO) Take 1 tablet by mouth daily.   Yes [provider]  lisinopril (ZESTRIL) 20 MG tablet TAKE 1 TABLET EVERY DAY Patient taking differently: Take 20 mg by mouth daily. 09/04/20  Yes Croitoru, Mihai, MD  metoprolol tartrate (LOPRESSOR) 50 MG tablet TAKE 1 TABLET TWICE DAILY Patient taking differently: Take 50 mg by mouth 2 (two) times daily. 09/04/20  Yes Croitoru, Mihai, MD  mirtazapine (REMERON) 15 MG tablet Take 15 mg by mouth at bedtime.   Yes [provider]  Multiple Vitamin (MULITIVITAMIN WITH MINERALS) TABS Take 1  tablet by mouth daily.   Yes [provider]  warfarin (COUMADIN) 2.5 MG tablet TAKE 1 TO 1 AND 1/2 TABLETS DAILY AS DIRECTED BY COUMADIN CLINIC Patient taking differently: Take 2.5 mg by mouth daily. 12/14/20  Yes Croitoru, Mihai, MD  clindamycin (CLEOCIN) 150 MG capsule Take 3 capsules by mouth as directed. For dental procedure/cleanings Patient not taking: Reported on 04/17/2021 08/17/15   [provider]    Allergies    Tizanidine hcl, Crestor [rosuvastatin calcium], Latex, and Penicillins  Review of Systems   Review of Systems  All other systems reviewed and are negative.   Physical Exam Updated Vital Signs BP 121/77   Pulse 85   Temp 98.3 F (36.8 C)   Resp 13   Ht 5\' 6"  (1.676 m)   Wt 48.1 kg   SpO2 100%   BMI 17.11 kg/m   Physical Exam Vitals and nursing note reviewed.  Constitutional:      Appearance: She is well-developed.  HENT:     Head: Normocephalic and atraumatic.     Mouth/Throat:     Mouth: Mucous membranes are moist.     Pharynx: Oropharynx is clear.  Eyes:     Pupils: Pupils are equal, round, and reactive to light.  Cardiovascular:     Rate and Rhythm: Normal rate and regular rhythm.  Pulmonary:     Effort: No respiratory distress.     Breath sounds: No stridor.  Abdominal:     General: Abdomen is flat. There is no distension.  Musculoskeletal:        General: No swelling or tenderness. Normal range of motion.     Cervical back: Normal range of motion.  Skin:    General: Skin is warm and dry.     Coloration: Skin is not jaundiced.  Neurological:     General: No focal deficit present.     Mental Status: She is alert.     ED Results / Procedures / Treatments   Labs (all labs ordered are listed, but only abnormal results are displayed) Labs Reviewed  URINALYSIS, ROUTINE W REFLEX MICROSCOPIC - Abnormal; Notable for the following components:      Result Value   Color, Urine AMBER (*)    APPearance HAZY (*)    Protein, ur  30 (*)    All other components within normal limits  CBC WITH DIFFERENTIAL/PLATELET - Abnormal; Notable for the following components:   WBC 15.4 (*)    Hemoglobin 11.2 (*)    HCT 34.6 (*)    Neutro Abs 12.8 (*)    Monocytes Absolute 1.1 (*)    Abs Immature Granulocytes 0.18 (*)    All other components within normal limits  COMPREHENSIVE METABOLIC PANEL - Abnormal; Notable for the following components:   Sodium 129 (*)    Chloride 92 (*)    Glucose, Bld  120 (*)    Total Protein 6.3 (*)    Albumin 2.9 (*)    Alkaline Phosphatase 147 (*)    All other components within normal limits  PROTIME-INR - Abnormal; Notable for the following components:   Prothrombin Time >90.0 (*)    INR >10.0 (*)    All other components within normal limits  PROTIME-INR - Abnormal; Notable for the following components:   Prothrombin Time >90.0 (*)    INR >10.0 (*)    All other components within normal limits  COMPREHENSIVE METABOLIC PANEL - Abnormal; Notable for the following components:   Sodium 129 (*)    Chloride 93 (*)    Glucose, Bld 145 (*)    Total Protein 5.7 (*)    Albumin 2.6 (*)    Alkaline Phosphatase 133 (*)    All other components within normal limits  CBC - Abnormal; Notable for the following components:   WBC 14.3 (*)    RBC 3.81 (*)    Hemoglobin 10.7 (*)    HCT 32.8 (*)    All other components within normal limits  SARS CORONAVIRUS 2 (TAT 6-24 HRS)  LIPASE, BLOOD    EKG None  Radiology CT ABDOMEN PELVIS WO CONTRAST  Result Date: 04/17/2021 CLINICAL DATA:  Nausea and vomiting EXAM: CT ABDOMEN AND PELVIS WITHOUT CONTRAST TECHNIQUE: Multidetector CT imaging of the abdomen and pelvis was performed following the standard protocol without IV contrast. COMPARISON:  None. FINDINGS: LOWER CHEST: Normal. HEPATOBILIARY: There are multiple hypodense lesions within the liver, which are poorly characterized on this noncontrast study. The largest lesion is in the right hepatic lobe and  measures 3.3 cm. There is a 3.0 cm lesion in the left hepatic lobe. The gallbladder is normal. PANCREAS: Normal pancreas. No ductal dilatation or peripancreatic fluid collection. SPLEEN: Normal. ADRENALS/URINARY TRACT: The adrenal glands are normal. Nonobstructing calculus in the interpolar left kidney measuring 3 mm. The urinary bladder is normal for degree of distention STOMACH/BOWEL: There is no hiatal hernia. Normal duodenal course and caliber. No small bowel dilatation or inflammation. No focal colonic abnormality. Normal appendix. VASCULAR/LYMPHATIC: There is calcific atherosclerosis of the abdominal aorta. No lymphadenopathy. REPRODUCTIVE: Partially calcified uterine fibroid. MUSCULOSKELETAL. No bony spinal canal stenosis or focal osseous abnormality. OTHER: None. IMPRESSION: 1. No acute abnormality of the abdomen or pelvis. 2. Multiple hypodense lesions within the liver, poorly characterized on this noncontrast study. These are concerning for metastatic disease. Abdominal MRI with and without contrast is recommended for further characterization. 3. Nonobstructing 3 mm left renal calculus. Aortic Atherosclerosis (ICD10-I70.0). Electronically Signed   By: Ulyses Jarred M.D.   On: 04/17/2021 20:05   DG Chest 1 View  Result Date: 04/18/2021 CLINICAL DATA:  One month of loss of appetite worsening weakness EXAM: CHEST  1 VIEW COMPARISON:  May 07, 2010 FINDINGS: Left chest pacemaker with leads overlying the right atrium and right ventricle. Prior median sternotomy and aortic valve replacement. The heart size and mediastinal contours are within normal limits. No focal consolidation. Blunting of the right hemidiaphragm, similar prior. No visible pleural effusion or pneumothorax. The visualized skeletal structures are unremarkable. IMPRESSION: No acute cardiopulmonary findings. Electronically Signed   By: Dahlia Bailiff MD   On: 04/18/2021 00:43    Procedures Procedures   Medications Ordered in  ED Medications  0.9 %  sodium chloride infusion ( Intravenous New Bag/Given 04/18/21 0147)  atorvastatin (LIPITOR) tablet 40 mg (has no administration in time range)  lisinopril (ZESTRIL) tablet 20 mg (has  no administration in time range)  metoprolol tartrate (LOPRESSOR) tablet 50 mg (50 mg Oral Given 04/18/21 0309)  mirtazapine (REMERON) tablet 15 mg (15 mg Oral Given 04/18/21 0310)  acetaminophen (TYLENOL) tablet 650 mg (has no administration in time range)    Or  acetaminophen (TYLENOL) suppository 650 mg (has no administration in time range)  oxyCODONE (Oxy IR/ROXICODONE) immediate release tablet 5 mg (has no administration in time range)  ondansetron (ZOFRAN) tablet 4 mg (has no administration in time range)    Or  ondansetron (ZOFRAN) injection 4 mg (has no administration in time range)  senna-docusate (Senokot-S) tablet 1 tablet (has no administration in time range)  bisacodyl (DULCOLAX) suppository 10 mg (has no administration in time range)  phytonadione (VITAMIN K) tablet 2.5 mg (2.5 mg Oral Given 04/18/21 0309)    ED Course  I have reviewed the triage vital signs and the nursing notes.  Pertinent labs & imaging results that were available during my care of the patient were reviewed by me and considered in my medical decision making (see chart for details).    MDM Rules/Calculators/A&P                          Concern for cancer. Can't get MRI because of her specific pacemaker. can't fully reverse because of valve. Will admit to medicine for same.   Final Clinical Impression(s) / ED Diagnoses Final diagnoses:  Weakness    Rx / DC Orders ED Discharge Orders    None       Chaela Branscum, Corene Cornea, MD 04/18/21 [phone]

## 2021-04-17 NOTE — ED Triage Notes (Signed)
Pt reports loss of appetite since April 1st with generalized weakness and body aches. Pt denies any abd pain or n/v/d. Denies any urinary complaints. Pt reports she has lost about 20lbs in the past month and a half.

## 2021-04-17 NOTE — ED Notes (Signed)
Critical INR resulted, pt left, called pt at home and advised pt to return to the ED

## 2021-04-17 NOTE — ED Provider Notes (Signed)
Emergency Medicine Provider Triage Evaluation Note  [name] [name] , a 75 y.o. female  was evaluated in triage.  Pt complains of generalized weakness and loss of appetite.  Patient states this has been going on since April 1.  She reports gradual worsening weakness and body aches.  She states that she has difficulty swallowing food, and when she does, she has very early satiety.  No difficulty swallowing liquids.  No fevers, chills, chest pain, shortness of breath, cough, urinary symptoms.  Has been about a week since her last bowel movement, this is not atypical for her.  She does report worsening abdominal spasms as if she needs to have a BM, but is not doing so.  She has followed up with a primary care doctor about this, but does not what was done or what the results are. She reports ~20 lbs weight loss in the last 6 wks.   Review of Systems  Positive: Weight loss, weakness, loss of appetite Negative: Fever, cp, sob  Physical Exam  BP 140/83 (BP Location: Left Arm)   Pulse 95   Temp 98.3 F (36.8 C)   Resp 14   Ht 5\' 6"  (1.676 m)   Wt 48.1 kg   SpO2 100%   BMI 17.11 kg/m  Gen:   Awake, no distress   Resp:  Normal effort  MSK:   Moves extremities without difficulty  Other:  Mild tenderness palpation of lower abdomen.  No rigidity, guarding, distention.  Medical Decision Making  Medically screening exam initiated at 6:22 PM.  Appropriate orders placed.  Nabeeha Badertscher was informed that the remainder of the evaluation will be completed by another provider, this initial triage assessment does not replace that evaluation, and the importance of remaining in the ED until their evaluation is complete.  Labs, ekg, ua, and ct ordered   Franchot Heidelberg, PA-C 04/17/21 Ricky Ala, MD 04/18/21 [phone]

## 2021-04-18 ENCOUNTER — Observation Stay (HOSPITAL_COMMUNITY): Payer: Medicare HMO

## 2021-04-18 ENCOUNTER — Encounter (HOSPITAL_COMMUNITY): Payer: Self-pay | Admitting: Family Medicine

## 2021-04-18 DIAGNOSIS — I2510 Atherosclerotic heart disease of native coronary artery without angina pectoris: Secondary | ICD-10-CM | POA: Diagnosis not present

## 2021-04-18 DIAGNOSIS — D735 Infarction of spleen: Secondary | ICD-10-CM | POA: Diagnosis not present

## 2021-04-18 DIAGNOSIS — E871 Hypo-osmolality and hyponatremia: Secondary | ICD-10-CM | POA: Diagnosis present

## 2021-04-18 DIAGNOSIS — J984 Other disorders of lung: Secondary | ICD-10-CM | POA: Diagnosis not present

## 2021-04-18 DIAGNOSIS — J439 Emphysema, unspecified: Secondary | ICD-10-CM | POA: Diagnosis not present

## 2021-04-18 DIAGNOSIS — R791 Abnormal coagulation profile: Secondary | ICD-10-CM | POA: Diagnosis not present

## 2021-04-18 DIAGNOSIS — R531 Weakness: Secondary | ICD-10-CM | POA: Diagnosis not present

## 2021-04-18 DIAGNOSIS — K769 Liver disease, unspecified: Secondary | ICD-10-CM | POA: Diagnosis present

## 2021-04-18 DIAGNOSIS — I82890 Acute embolism and thrombosis of other specified veins: Secondary | ICD-10-CM | POA: Diagnosis not present

## 2021-04-18 DIAGNOSIS — C787 Secondary malignant neoplasm of liver and intrahepatic bile duct: Secondary | ICD-10-CM | POA: Diagnosis not present

## 2021-04-18 LAB — COMPREHENSIVE METABOLIC PANEL
ALT: 27 U/L (ref 0–44)
AST: 31 U/L (ref 15–41)
Albumin: 2.6 g/dL — ABNORMAL LOW (ref 3.5–5.0)
Alkaline Phosphatase: 133 U/L — ABNORMAL HIGH (ref 38–126)
Anion gap: 9 (ref 5–15)
BUN: 15 mg/dL (ref 8–23)
CO2: 27 mmol/L (ref 22–32)
Calcium: 8.9 mg/dL (ref 8.9–10.3)
Chloride: 93 mmol/L — ABNORMAL LOW (ref 98–111)
Creatinine, Ser: 0.8 mg/dL (ref 0.44–1.00)
GFR, Estimated: >60 mL/min (ref >60)
Glucose, Bld: 145 mg/dL — ABNORMAL HIGH (ref 70–99)
Potassium: 4 mmol/L (ref 3.5–5.1)
Sodium: 129 mmol/L — ABNORMAL LOW (ref 135–145)
Total Bilirubin: 1.2 mg/dL (ref 0.3–1.2)
Total Protein: 5.7 g/dL — ABNORMAL LOW (ref 6.5–8.1)

## 2021-04-18 LAB — PROTIME-INR
INR: >10 (ref 0.8–1.2)
Prothrombin Time: >90 seconds — ABNORMAL HIGH (ref 11.4–15.2)

## 2021-04-18 LAB — CBC
HCT: 32.8 % — ABNORMAL LOW (ref 36.0–46.0)
Hemoglobin: 10.7 g/dL — ABNORMAL LOW (ref 12.0–15.0)
MCH: 28.1 pg (ref 26.0–34.0)
MCHC: 32.6 g/dL (ref 30.0–36.0)
MCV: 86.1 fL (ref 80.0–100.0)
Platelets: 236 10*3/uL (ref 150–400)
RBC: 3.81 MIL/uL — ABNORMAL LOW (ref 3.87–5.11)
RDW: 14.5 % (ref 11.5–15.5)
WBC: 14.3 10*3/uL — ABNORMAL HIGH (ref 4.0–10.5)
nRBC: 0 % (ref 0.0–0.2)

## 2021-04-18 LAB — SARS CORONAVIRUS 2 (TAT 6-24 HRS): SARS Coronavirus 2: NEGATIVE

## 2021-04-18 MED ORDER — OXYCODONE HCL 5 MG PO TABS
5.0000 mg | ORAL_TABLET | Freq: Four times a day (QID) | ORAL | Status: DC | PRN
Start: 2021-04-18 — End: 2021-04-20
  Administered 2021-04-19: 5 mg via ORAL
  Filled 2021-04-18: qty 1

## 2021-04-18 MED ORDER — ACETAMINOPHEN 325 MG PO TABS
650.0000 mg | ORAL_TABLET | Freq: Four times a day (QID) | ORAL | Status: DC | PRN
  Administered 2021-04-19 – 2021-04-20 (×3): 650 mg via ORAL
  Filled 2021-04-18 (×4): qty 2

## 2021-04-18 MED ORDER — SODIUM CHLORIDE 0.9 % IV SOLN: INTRAVENOUS | Status: AC

## 2021-04-18 MED ORDER — ATORVASTATIN CALCIUM 40 MG PO TABS
40.0000 mg | ORAL_TABLET | Freq: Every day | ORAL | Status: DC
  Administered 2021-04-18 – 2021-04-20 (×3): 40 mg via ORAL
  Filled 2021-04-18 (×3): qty 1

## 2021-04-18 MED ORDER — LISINOPRIL 20 MG PO TABS
20.0000 mg | ORAL_TABLET | Freq: Every day | ORAL | Status: DC
  Administered 2021-04-18 – 2021-04-20 (×3): 20 mg via ORAL
  Filled 2021-04-18 (×3): qty 1

## 2021-04-18 MED ORDER — METOPROLOL TARTRATE 50 MG PO TABS
50.0000 mg | ORAL_TABLET | Freq: Two times a day (BID) | ORAL | Status: DC
  Administered 2021-04-18 – 2021-04-20 (×6): 50 mg via ORAL
  Filled 2021-04-18: qty 2
  Filled 2021-04-18 (×3): qty 1
  Filled 2021-04-18: qty 2
  Filled 2021-04-18: qty 1

## 2021-04-18 MED ORDER — ONDANSETRON HCL 4 MG/2ML IJ SOLN: 4.0000 mg | Freq: Four times a day (QID) | INTRAMUSCULAR | Status: DC | PRN

## 2021-04-18 MED ORDER — MIRTAZAPINE 15 MG PO TABS
15.0000 mg | ORAL_TABLET | Freq: Every day | ORAL | Status: DC
  Administered 2021-04-18 – 2021-04-19 (×3): 15 mg via ORAL
  Filled 2021-04-18 (×4): qty 1

## 2021-04-18 MED ORDER — BISACODYL 10 MG RE SUPP
10.0000 mg | Freq: Every day | RECTAL | Status: DC | PRN
Start: 2021-04-18 — End: 2021-04-20

## 2021-04-18 MED ORDER — ENSURE ENLIVE PO LIQD
237.0000 mL | Freq: Three times a day (TID) | ORAL | Status: DC
  Administered 2021-04-18 – 2021-04-20 (×6): 237 mL via ORAL
  Filled 2021-04-18: qty 237

## 2021-04-18 MED ORDER — PHYTONADIONE 5 MG PO TABS
2.5000 mg | ORAL_TABLET | Freq: Once | ORAL | Status: AC
  Administered 2021-04-18: 2.5 mg via ORAL
  Filled 2021-04-18: qty 1

## 2021-04-18 MED ORDER — ONDANSETRON HCL 4 MG PO TABS: 4.0000 mg | ORAL_TABLET | Freq: Four times a day (QID) | ORAL | Status: DC | PRN

## 2021-04-18 MED ORDER — ACETAMINOPHEN 650 MG RE SUPP: 650.0000 mg | Freq: Four times a day (QID) | RECTAL | Status: DC | PRN

## 2021-04-18 MED ORDER — SENNOSIDES-DOCUSATE SODIUM 8.6-50 MG PO TABS: 1.0000 | ORAL_TABLET | Freq: Every evening | ORAL | Status: DC | PRN

## 2021-04-18 MED ORDER — IOHEXOL 300 MG/ML  SOLN
75.0000 mL | Freq: Once | INTRAMUSCULAR | Status: AC | PRN
  Administered 2021-04-18: 75 mL via INTRAVENOUS

## 2021-04-18 NOTE — Evaluation (Addendum)
Physical Therapy Evaluation Patient Details Name: [name] [name] MRN: [id number] DOB: 1946-09-17 Today's Date: 04/18/2021   History of Present Illness  Pt is a 75 y/o female admitted 5/17 seondary to generalized weakness, loss of appetite, and unintentional weight loss.  Found to have supratherapeutic INR and imaging revealed liver lesions. PMH includes s/p AVR, s/p pacemaker, HTN, and tobacco use.  Clinical Impression  Pt admitted secondary to problem above with deficits below. MD cleared for participation with PT. Pt requiring min guard A to stand and take side steps at EOB. Pt asymptomatic throughout. Does report with longer distances, she fatigues easily. Anticipate pt will progress well during stay. Will determine appropriate DME recommendations as mobility progressed. Will continue to follow acutely.     Follow Up Recommendations Home health PT;Supervision for mobility/OOB (vs No PT follow up pending progression)    Equipment Recommendations  Other (comment) (TBD pending progression)    Recommendations for Other Services       Precautions / Restrictions Precautions Precautions: Fall Restrictions Weight Bearing Restrictions: No      Mobility  Bed Mobility Overal bed mobility: Needs Assistance Bed Mobility: Supine to Sit;Sit to Supine     Supine to sit: Supervision Sit to supine: Supervision   General bed mobility comments: Supervision for safety.    Transfers Overall transfer level: Needs assistance Equipment used: 1 person hand held assist Transfers: Sit to/from Stand Sit to Stand: Min guard         General transfer comment: Min guard for safety.  Ambulation/Gait Ambulation/Gait assistance: Min guard   Assistive device: 1 person hand held assist       General Gait Details: took multiple side steps at edge of stretcher this session. Pt with mild unsteadiness, but no LOB. Min guard for safety. Did not progress mobility further out of abundance of caution  given INR levels.  Stairs            Wheelchair Mobility    Modified Rankin (Stroke Patients Only)       Balance Overall balance assessment: Needs assistance Sitting-balance support: No upper extremity supported;Feet supported Sitting balance-Leahy Scale: Good     Standing balance support: Single extremity supported Standing balance-Leahy Scale: Fair                               Pertinent Vitals/Pain Pain Assessment: No/denies pain    Home Living Family/patient expects to be discharged to:: Private residence Living Arrangements: Children Available Help at Discharge: Family Type of Home: House Home Access: Stairs to enter Entrance Stairs-Rails: None Technical brewer of Steps: 3-4 Home Layout: One level Home Equipment: Tub bench      Prior Function Level of Independence: Independent               Hand Dominance        Extremity/Trunk Assessment   Upper Extremity Assessment Upper Extremity Assessment: Overall WFL for tasks assessed    Lower Extremity Assessment Lower Extremity Assessment: Generalized weakness    Cervical / Trunk Assessment Cervical / Trunk Assessment: Normal  Communication   Communication: No difficulties  Cognition Arousal/Alertness: Awake/alert Behavior During Therapy: WFL for tasks assessed/performed Overall Cognitive Status: Within Functional Limits for tasks assessed  General Comments General comments (skin integrity, edema, etc.): pt's son present during session.    Exercises     Assessment/Plan    PT Assessment Patient needs continued PT services  PT Problem List Decreased activity tolerance;Decreased balance;Decreased mobility;Decreased strength       PT Treatment Interventions DME instruction;Gait training;Functional mobility training;Therapeutic activities;Therapeutic exercise;Balance training;Stair training;Patient/family education     PT Goals (Current goals can be found in the Care Plan section)  Acute Rehab PT Goals Patient Stated Goal: to be independent PT Goal Formulation: With patient/family Time For Goal Achievement: 05/02/21 Potential to Achieve Goals: Good    Frequency Min 3X/week   Barriers to discharge        Co-evaluation               AM-PAC PT "6 Clicks" Mobility  Outcome Measure Help needed turning from your back to your side while in a flat bed without using bedrails?: None Help needed moving from lying on your back to sitting on the side of a flat bed without using bedrails?: A Little Help needed moving to and from a bed to a chair (including a wheelchair)?: A Little Help needed standing up from a chair using your arms (e.g., wheelchair or bedside chair)?: A Little Help needed to walk in hospital room?: A Little Help needed climbing 3-5 steps with a railing? : A Lot 6 Click Score: 18    End of Session Equipment Utilized During Treatment: Gait belt Activity Tolerance: Patient tolerated treatment well Patient left: in bed;with call bell/phone within reach;with family/visitor present (on stretcher in ED) Nurse Communication: Mobility status PT Visit Diagnosis: Unsteadiness on feet (R26.81);Other abnormalities of gait and mobility (R26.89)    Time: 4818-5631 PT Time Calculation (min) (ACUTE ONLY): 12 min   Charges:   PT Evaluation $PT Eval Low Complexity: 1 Low          Lou Miner, DPT  Acute Rehabilitation Services  Pager: [phone] Office: [phone]   Rudean Hitt 04/18/2021, 11:44 AM

## 2021-04-18 NOTE — ED Notes (Signed)
Pt back from CT

## 2021-04-18 NOTE — Progress Notes (Signed)
NEW ADMISSION NOTE New Admission Note:   Arrival Method: ED stretcher Mental Orientation: AAOx4 Telemetry: 5M19 Assessment: Completed Skin: Intact IV: LAC Pain: 0/10 Tubes: n/a Safety Measures: Safety Fall Prevention Plan has been given, discussed and signed Admission: Completed [facility] Orientation: Patient has been orientated to the room, unit and staff.  Family: Son at bedside  Orders have been reviewed and implemented. Will continue to monitor the patient. Call light has been placed within reach and bed alarm has been activated.   Vira Agar, RN

## 2021-04-18 NOTE — Progress Notes (Signed)
PT Cancellation Note  Patient Details Name: [name] [name] MRN: [id number] DOB: 1945-12-29   Cancelled Treatment:    Reason Eval/Treat Not Completed: Medical issues which prohibited therapy Pt with INR >10. Awaiting to hear from MD to see if clear to participate with PT. Will hold until medically appropriate.   Lou Miner, DPT  Acute Rehabilitation Services  Pager: [phone] Office: [phone]    Rudean Hitt 04/18/2021, 9:11 AM

## 2021-04-18 NOTE — ED Notes (Signed)
Opyd, MD made aware

## 2021-04-18 NOTE — Plan of Care (Signed)

## 2021-04-18 NOTE — Progress Notes (Signed)
PROGRESS NOTE    [name] [name]  OAC:[id number] DOB: 1946-03-27 DOA: 04/17/2021 PCP: Leeroy Cha, MD   Brief Narrative:  [name] [name] is a 75 y.o. 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  Initial symptoms began around January of this year with some 20 to 30 pound weight loss.  Patient's INR was incidentally noted to be greater than 10.  CT abdomen without contrast also shows questionable lesion x2 in the liver.  Oncology consulted requesting further imaging for possible staging while admitted for supratherapeutic INR.  Assessment & Plan:   Principal Problem:   Supratherapeutic INR Active Problems:   H/O aortic valve replacement, 23 mm [employer]   Complete heart block (HCC)   Paroxysmal atrial tachycardia (HCC)   Liver lesion   Hyponatremia   Supratherapeutic INR; mechanical aortic valve   - Patient with mechanical valve presents with loss of appetite, weakness, and wt-loss and is found to have INR >10 without bleeding  - 2.5 mg PO vit K was given, continue to hold warfarin and repeat INR in am   Incidentally noted liver lesions, concerning for metastatic disease of unknown primary - Non-contrast CT reveals multiple hypodense liver lesions concerning for metastases  - Per MRI tech, patient unable to undergo MRI due to her pacer  - Screening colonoscopy in 2016 notable for diverticulosis and hemorrhoids only; patient reports being up to date with mammograms  - Oncology consulted recommending AFP with morning labs, CT chest abdomen  pelvis with contrast per discussion with radiology, despite contrast shortage she does qualify for emergent use given initial staging, without which would delay her care    Hypovolemic hyponatremia  - Serum sodium is 129 on admission in setting of hypovolemia  - Hydrate gently with NS, monitor    Essential hypertension  - Continue lisinopril and metoprolol    DVT prophylaxis: NONE currently --> supratherapeutic INR  Code Status: Full  Family Communication: Son at bedside  Status is: Inpatient  Dispo: The patient is from: Home              Anticipated d/c is to: To be determined              Anticipated d/c date is: 48 to 72 hours not              Patient currently at medically stable for discharge  Consultants:   Oncology  Procedures:   None  Antimicrobials:  None indicated  Subjective: No acute issues or events overnight  Objective: Vitals:   04/17/21 1804 04/17/21 1814 04/18/21 0302  BP: 140/83  121/77  Pulse: 95  85  Resp: 14  13  Temp: 98.3 F (36.8 C)    SpO2: 100%  100%  Weight:  48.1 kg   Height:  5\' 6"  (1.676 m)    No intake or output data in the 24 hours ending 04/18/21 0753 Filed Weights   04/17/21 1814  Weight: 48.1 kg    Examination:  General:  Pleasantly resting in bed, No acute distress.  Profoundly cachectic in appearance HEENT:  Normocephalic atraumatic.  Sclerae nonicteric, noninjected.  Extraocular movements intact bilaterally.  Neck:  Without mass or deformity.  Trachea is midline. Lungs:  Clear to auscultate bilaterally without rhonchi, wheeze, or rales. Heart:  Regular rate and rhythm.  Without murmurs, rubs, or gallops. Abdomen:  Soft, nontender, nondistended.  Without guarding or rebound. Extremities: Without cyanosis, clubbing, edema, or obvious deformity. Vascular:  Dorsalis pedis and posterior tibial pulses palpable bilaterally. Skin:  Warm and dry, no erythema, no ulcerations.    Data Reviewed: I have personally reviewed  following labs and imaging studies  CBC: Recent Labs  Lab 04/17/21 1900 04/18/21 0229  WBC 15.4* 14.3*  NEUTROABS 12.8*  --   HGB 11.2* 10.7*  HCT 34.6* 32.8*  MCV 85.9 86.1  PLT 305 242   Basic Metabolic Panel: Recent Labs  Lab 04/17/21 1900 04/18/21 0229  NA 129* 129*  K 4.4 4.0  CL 92* 93*  CO2 27 27  GLUCOSE 120* 145*  BUN 15 15  CREATININE 0.80 0.80  CALCIUM 9.3 8.9   GFR: Estimated Creatinine Clearance: 44.7 mL/min (by C-G formula based on SCr of 0.8 mg/dL). Liver Function Tests: Recent Labs  Lab 04/17/21 1900 04/18/21 0229  AST 40 31  ALT 28 27  ALKPHOS 147* 133*  BILITOT 1.1 1.2  PROT 6.3* 5.7*  ALBUMIN 2.9* 2.6*   Recent Labs  Lab 04/17/21 1900  LIPASE 37   No results for input(s): AMMONIA in the last 168 hours. Coagulation Profile: Recent Labs  Lab 04/17/21 1900 04/18/21 0229  INR >10.0* >10.0*   Cardiac Enzymes: No results for input(s): CKTOTAL, CKMB, CKMBINDEX, TROPONINI in the last 168 hours. BNP (last 3 results) No results for input(s): PROBNP in the last 8760 hours. HbA1C: No results for input(s): HGBA1C in the last 72 hours. CBG: No results for input(s): GLUCAP in the last 168 hours. Lipid Profile: No results for input(s): CHOL, HDL, LDLCALC, TRIG, CHOLHDL, LDLDIRECT in the last 72 hours. Thyroid Function Tests: No results for input(s): TSH, T4TOTAL, FREET4, T3FREE, THYROIDAB in the last 72 hours. Anemia Panel: No results for input(s): VITAMINB12, FOLATE, FERRITIN, TIBC, IRON, RETICCTPCT in the last 72 hours. Sepsis Labs: No results for input(s): PROCALCITON, LATICACIDVEN in the last 168 hours.  Recent Results (from the past 240 hour(s))  SARS CORONAVIRUS 2 (TAT 6-24 HRS) Nasopharyngeal Nasopharyngeal Swab     Status: None   Collection Time: 04/18/21  1:45 AM   Specimen: Nasopharyngeal Swab  Result Value Ref Range Status   SARS Coronavirus 2 NEGATIVE NEGATIVE Final    Comment: (NOTE) SARS-CoV-2 target nucleic acids are NOT  DETECTED.  The SARS-CoV-2 RNA is generally detectable in upper and lower respiratory specimens during the acute phase of infection. Negative results do not preclude SARS-CoV-2 infection, do not rule out co-infections with other pathogens, and should not be used as the sole basis for treatment or other patient management decisions. Negative results must be combined with clinical observations, patient history, and epidemiological information. The expected result is Negative.  Fact Sheet for Patients: SugarRoll.be  Fact Sheet for Healthcare Providers: https://www.woods-mathews.com/  This test is not yet approved or cleared by the [place] FDA and  has been authorized for detection and/or diagnosis of SARS-CoV-2 by FDA under an Emergency Use Authorization (EUA). This EUA will remain  in effect (meaning this test can be used) for the duration of the COVID-19 declaration under Se ction 564(b)(1) of the Act, 21 U.S.C. section 360bbb-3(b)(1), unless the authorization is terminated or revoked sooner.  Performed at [facility], [place] [address],  [place], [place] [address]          Radiology Studies: CT ABDOMEN PELVIS WO CONTRAST  Result Date: 04/17/2021 CLINICAL DATA:  Nausea and vomiting EXAM: CT ABDOMEN AND PELVIS WITHOUT CONTRAST TECHNIQUE: Multidetector CT imaging of the abdomen and pelvis was performed following the standard protocol without IV contrast. COMPARISON:  None. FINDINGS: LOWER CHEST: Normal. HEPATOBILIARY: There are multiple hypodense lesions within the liver, which are poorly characterized on this noncontrast study. The largest lesion is in the right hepatic lobe and measures 3.3 cm. There is a 3.0 cm lesion in the left hepatic lobe. The gallbladder is normal. PANCREAS: Normal pancreas. No ductal dilatation or peripancreatic fluid collection. SPLEEN: Normal. ADRENALS/URINARY TRACT: The adrenal glands are normal.  Nonobstructing calculus in the interpolar left kidney measuring 3 mm. The urinary bladder is normal for degree of distention STOMACH/BOWEL: There is no hiatal hernia. Normal duodenal course and caliber. No small bowel dilatation or inflammation. No focal colonic abnormality. Normal appendix. VASCULAR/LYMPHATIC: There is calcific atherosclerosis of the abdominal aorta. No lymphadenopathy. REPRODUCTIVE: Partially calcified uterine fibroid. MUSCULOSKELETAL. No bony spinal canal stenosis or focal osseous abnormality. OTHER: None. IMPRESSION: 1. No acute abnormality of the abdomen or pelvis. 2. Multiple hypodense lesions within the liver, poorly characterized on this noncontrast study. These are concerning for metastatic disease. Abdominal MRI with and without contrast is recommended for further characterization. 3. Nonobstructing 3 mm left renal calculus. Aortic Atherosclerosis (ICD10-I70.0). Electronically Signed   By: Ulyses Jarred M.D.   On: 04/17/2021 20:05   DG Chest 1 View  Result Date: 04/18/2021 CLINICAL DATA:  One month of loss of appetite worsening weakness EXAM: CHEST  1 VIEW COMPARISON:  May 07, 2010 FINDINGS: Left chest pacemaker with leads overlying the right atrium and right ventricle. Prior median sternotomy and aortic valve replacement. The heart size and mediastinal contours are within normal limits. No focal consolidation. Blunting of the right hemidiaphragm, similar prior. No visible pleural effusion or pneumothorax. The visualized skeletal structures are unremarkable. IMPRESSION: No acute cardiopulmonary findings. Electronically Signed   By: Dahlia Bailiff MD   On: 04/18/2021 00:43   Scheduled Meds: . atorvastatin  40 mg Oral Daily  . lisinopril  20 mg Oral Daily  . metoprolol tartrate  50 mg Oral BID  . mirtazapine  15 mg Oral QHS   Continuous Infusions: . sodium chloride 100 mL/hr at 04/18/21 0147     LOS: 0 days   Time spent: 37min  Alexus Michael C Lucia Harm, DO [facility]  If 7PM-7AM, please contact night-coverage [email or web address]  04/18/2021, 7:53 AM

## 2021-04-18 NOTE — ED Notes (Signed)
Breakfast tray provided. 

## 2021-04-18 NOTE — H&P (Signed)
History and Physical    Tylene Quashie ZOX:[id number] DOB: Sep 21, 1946 DOA: 04/17/2021  PCP: Leeroy Cha, MD   Patient coming from: Home   Chief Complaint: weight loss, loss of appetite, general weakness   HPI: Zalayah Pizzuto is a 75 y.o. female with medical history significant for Bentall repair of small ascending aortic aneurysm with mechanical aortic valve replacement, heart block with pacer, rare paroxysmal atrial tachycardia not requiring treatment, hypertension, and remote tobacco abuse, now presenting to emergency department for evaluation of generalized weakness, loss of appetite, and unintentional weight loss.  Patient reports that she began to develop poor appetite in January 2022, and his continued to have poor appetite since then.  She has become progressively fatigued and generally weak and reports a 20 pound weight loss.  She denies nausea or vomiting but has not been hungry and feels full after just a few bites.  She has had increased general aches and pains over the past couple months, particularly in her low back.  Her general weakness and fatigue is currently at the point where she has trouble getting out of bed recently.  She denies any bleeding.  She had screening colonoscopy in 2016 notable for diverticulosis and internal hemorrhoids only.  She reports being up-to-date on mammogram.  ED Course: Upon arrival to the ED, patient is found to be afebrile, saturating well on room air, and with stable blood pressure.  EKG features sinus rhythm with first-degree AV nodal block, RBBB, and LAFB.  Chest x-ray negative for acute cardiopulmonary disease.  CT the abdomen and pelvis is negative for acute finding but notable for multiple hypodense liver lesions concerning for metastases.  Chemistry panel features a sodium of 129, alkaline phosphatase 147, and albumin 2.9.  CBC with leukocytosis to 15,400 and mild normocytic anemia.  INR is >10.   Review of Systems:  All other  systems reviewed and apart from HPI, are negative.  Past Medical History:  Diagnosis Date  . Aortic stenosis 03/07/2010   replaced mechanical valve Bentall procedure\  . Atrial fibrillation ([place])   . Coronary artery disease    Dr. Avon Gully  . Dyslipidemia   . Hypertension   . Intermittent complete heart block ([place])    H/O  . Presence of permanent cardiac pacemaker   . S/P AAA repair 03/07/2010  . Systemic hypertension     Past Surgical History:  Procedure Laterality Date  . ABDOMINAL AORTIC ANEURYSM REPAIR  03/07/2010  . AORTIC VALVE REPLACEMENT  03/07/2010   mechanical valve Bentall procedure  . CARDIAC CATHETERIZATION  08/25/2009   severe AS, mild CAD  . COLONOSCOPY WITH PROPOFOL N/A 09/12/2015   Procedure: COLONOSCOPY WITH PROPOFOL;  Surgeon: Juanita Craver, MD;  Location: [facility] END[facility];  Service: Endoscopy;  Laterality: N/A;  . PACEMAKER INSERTION  03/13/2010   [employer] Adapta  . US ECHOCARDIOGRAPHY  08/09/2011   mod. concentric LVH,LA mildly dilated,ca+ MV,trace TR and AI,mechanical AOV    Social History:   reports that she quit smoking about 11 years ago. She has never used smokeless tobacco. She reports that she does not drink alcohol and does not use drugs.  Allergies  Allergen Reactions  . Tizanidine Hcl     Other reaction(s): Hallucinations, nausea  . Crestor [Rosuvastatin Calcium] Other (See Comments)    Stomach cramps  . Latex Hives and Rash  . Penicillins Other (See Comments)    Has patient had a PCN reaction causing immediate rash, facial/tongue/throat swelling, SOB or lightheadedness with hypotension: unknown Has  patient had a PCN reaction causing severe rash involving mucus membranes or skin necrosis: unknown Has patient had a PCN reaction that required hospitalization already in the hospital when it happened Has patient had a PCN reaction occurring within the last 10 years: No If all of the above answers are "NO", then may proceed with Cephalosporin use.      Family History  Problem Relation Age of Onset  . Diabetes Mother   . Hypertension Mother   . Heart failure Brother   . Hypertension Brother   . Diabetes Brother      Prior to Admission medications   Medication Sig Start Date End Date Taking? Authorizing Provider  acetaminophen (TYLENOL) 325 MG tablet Take 325 mg by mouth every 6 (six) hours as needed for moderate pain or headache.   Yes [provider]  atorvastatin (LIPITOR) 40 MG tablet Take 1 tablet (40 mg total) by mouth daily. 03/28/21  Yes Croitoru, Mihai, MD  Calcium Carbonate (CALCARB 600 PO) Take 1 tablet by mouth daily.   Yes [provider]  lisinopril (ZESTRIL) 20 MG tablet TAKE 1 TABLET EVERY DAY Patient taking differently: Take 20 mg by mouth daily. 09/04/20  Yes Croitoru, Mihai, MD  metoprolol tartrate (LOPRESSOR) 50 MG tablet TAKE 1 TABLET TWICE DAILY Patient taking differently: Take 50 mg by mouth 2 (two) times daily. 09/04/20  Yes Croitoru, Mihai, MD  mirtazapine (REMERON) 15 MG tablet Take 15 mg by mouth at bedtime.   Yes [provider]  Multiple Vitamin (MULITIVITAMIN WITH MINERALS) TABS Take 1 tablet by mouth daily.   Yes [provider]  warfarin (COUMADIN) 2.5 MG tablet TAKE 1 TO 1 AND 1/2 TABLETS DAILY AS DIRECTED BY COUMADIN CLINIC Patient taking differently: Take 2.5 mg by mouth daily. 12/14/20  Yes Croitoru, Mihai, MD  clindamycin (CLEOCIN) 150 MG capsule Take 3 capsules by mouth as directed. For dental procedure/cleanings Patient not taking: Reported on 04/17/2021 08/17/15   [provider]    Physical Exam: Vitals:   04/17/21 1804 04/17/21 1814  BP: 140/83   Pulse: 95   Resp: 14   Temp: 98.3 F (36.8 C)   SpO2: 100%   Weight:  48.1 kg  Height:  5\' 6"  (1.676 m)    Constitutional: NAD, calm  Eyes: PERTLA, lids and conjunctivae normal ENMT: Mucous membranes are moist. Posterior pharynx clear of any exudate or lesions.   Neck: supple, no  thyromegaly Respiratory:  no wheezing, no crackles. No accessory muscle use.  Cardiovascular: S1 & S2 heard, regular rate and rhythm. No extremity edema.  Abdomen: No distension, no tenderness, soft. Bowel sounds active.  Musculoskeletal: no clubbing / cyanosis. No joint deformity upper and lower extremities.   Skin: no significant rashes, lesions, ulcers. Warm, dry, well-perfused. Neurologic: CN 2-12 grossly intact. Sensation intact. Moving all extremities.  Psychiatric: Alert and oriented to person, place, and situation. Very pleasant and cooperative.    Labs and Imaging on Admission: I have personally reviewed following labs and imaging studies  CBC: Recent Labs  Lab 04/17/21 1900  WBC 15.4*  NEUTROABS 12.8*  HGB 11.2*  HCT 34.6*  MCV 85.9  PLT 123456   Basic Metabolic Panel: Recent Labs  Lab 04/17/21 1900  NA 129*  K 4.4  CL 92*  CO2 27  GLUCOSE 120*  BUN 15  CREATININE 0.80  CALCIUM 9.3   GFR: Estimated Creatinine Clearance: 44.7 mL/min (by C-G formula based on SCr of 0.8 mg/dL). Liver Function Tests: Recent Labs  Lab 04/17/21 1900  AST 40  ALT 28  ALKPHOS 147*  BILITOT 1.1  PROT 6.3*  ALBUMIN 2.9*   Recent Labs  Lab 04/17/21 1900  LIPASE 37   No results for input(s): AMMONIA in the last 168 hours. Coagulation Profile: Recent Labs  Lab 04/17/21 1900  INR >10.0*   Cardiac Enzymes: No results for input(s): CKTOTAL, CKMB, CKMBINDEX, TROPONINI in the last 168 hours. BNP (last 3 results) No results for input(s): PROBNP in the last 8760 hours. HbA1C: No results for input(s): HGBA1C in the last 72 hours. CBG: No results for input(s): GLUCAP in the last 168 hours. Lipid Profile: No results for input(s): CHOL, HDL, LDLCALC, TRIG, CHOLHDL, LDLDIRECT in the last 72 hours. Thyroid Function Tests: No results for input(s): TSH, T4TOTAL, FREET4, T3FREE, THYROIDAB in the last 72 hours. Anemia Panel: No results for input(s): VITAMINB12, FOLATE, FERRITIN,  TIBC, IRON, RETICCTPCT in the last 72 hours. Urine analysis:    Component Value Date/Time   COLORURINE AMBER (A) 04/17/2021 1818   APPEARANCEUR HAZY (A) 04/17/2021 1818   LABSPEC 1.019 04/17/2021 1818   PHURINE 7.0 04/17/2021 1818   GLUCOSEU NEGATIVE 04/17/2021 1818   HGBUR NEGATIVE 04/17/2021 1818   BILIRUBINUR NEGATIVE 04/17/2021 [place] 04/17/2021 1818   PROTEINUR 30 (A) 04/17/2021 1818   UROBILINOGEN 0.2 03/05/2010 1126   NITRITE NEGATIVE 04/17/2021 1818   LEUKOCYTESUR NEGATIVE 04/17/2021 1818   Sepsis Labs: @LABRCNTIP (procalcitonin:4,lacticidven:4) )No results found for this or any previous visit (from the past 240 hour(s)).   Radiological Exams on Admission: CT ABDOMEN PELVIS WO CONTRAST  Result Date: 04/17/2021 CLINICAL DATA:  Nausea and vomiting EXAM: CT ABDOMEN AND PELVIS WITHOUT CONTRAST TECHNIQUE: Multidetector CT imaging of the abdomen and pelvis was performed following the standard protocol without IV contrast. COMPARISON:  None. FINDINGS: LOWER CHEST: Normal. HEPATOBILIARY: There are multiple hypodense lesions within the liver, which are poorly characterized on this noncontrast study. The largest lesion is in the right hepatic lobe and measures 3.3 cm. There is a 3.0 cm lesion in the left hepatic lobe. The gallbladder is normal. PANCREAS: Normal pancreas. No ductal dilatation or peripancreatic fluid collection. SPLEEN: Normal. ADRENALS/URINARY TRACT: The adrenal glands are normal. Nonobstructing calculus in the interpolar left kidney measuring 3 mm. The urinary bladder is normal for degree of distention STOMACH/BOWEL: There is no hiatal hernia. Normal duodenal course and caliber. No small bowel dilatation or inflammation. No focal colonic abnormality. Normal appendix. VASCULAR/LYMPHATIC: There is calcific atherosclerosis of the abdominal aorta. No lymphadenopathy. REPRODUCTIVE: Partially calcified uterine fibroid. MUSCULOSKELETAL. No bony spinal canal stenosis  or focal osseous abnormality. OTHER: None. IMPRESSION: 1. No acute abnormality of the abdomen or pelvis. 2. Multiple hypodense lesions within the liver, poorly characterized on this noncontrast study. These are concerning for metastatic disease. Abdominal MRI with and without contrast is recommended for further characterization. 3. Nonobstructing 3 mm left renal calculus. Aortic Atherosclerosis (ICD10-I70.0). Electronically Signed   By: Ulyses Jarred M.D.   On: 04/17/2021 20:05   DG Chest 1 View  Result Date: 04/18/2021 CLINICAL DATA:  One month of loss of appetite worsening weakness EXAM: CHEST  1 VIEW COMPARISON:  May 07, 2010 FINDINGS: Left chest pacemaker with leads overlying the right atrium and right ventricle. Prior median sternotomy and aortic valve replacement. The heart size and mediastinal contours are within normal limits. No focal consolidation. Blunting of the right hemidiaphragm, similar prior. No visible pleural effusion or pneumothorax. The visualized skeletal structures are unremarkable. IMPRESSION: No acute  cardiopulmonary findings. Electronically Signed   By: Dahlia Bailiff MD   On: 04/18/2021 00:43    EKG: Independently reviewed. Sinus rhythm, 1st degree AV block, RBBB, LAFB.   Assessment/Plan   1. Supratherapeutic INR; mechanical aortic valve   - Patient with mechanical valve presents with loss of appetite, weakness, and wt-loss and is found to have INR >10 without bleeding  - 2.5 mg PO vit K was given, continue to hold warfarin and repeat INR in am   2. Liver lesions  - Non-contrast CT reveals multiple hypodense liver lesions concerning for metastases  - Per MRI tech, patient unable to undergo MRI due to her pacer  - Screening colonoscopy in 2016 notable for diverticulosis and hemorrhoids only; patient reports being up to date with mammograms  - Could discuss next steps with oncology in am    3. Hyponatremia  - Serum sodium is 129 on admission in setting of hypovolemia   - Hydrate gently with NS, monitor    4. Hypertension  - Continue lisinopril and metoprolol    DVT prophylaxis: supratherapeutic INR  Code Status: Full  Level of Care: Level of care: Telemetry Medical Family Communication: Son updated at bedside Disposition Plan:  Patient is from: Home  Anticipated d/c is to: Home  Anticipated d/c date is: Possibly as early as 04/18/21 Patient currently: Pending repeat INR, PT eval, possibly additional imaging or biopsy  Consults called: none  Admission status: Observation     Vianne Bulls, MD [facility]  04/18/2021, 1:28 AM

## 2021-04-19 ENCOUNTER — Encounter (HOSPITAL_COMMUNITY): Payer: Self-pay | Admitting: Family Medicine

## 2021-04-19 DIAGNOSIS — Z20822 Contact with and (suspected) exposure to covid-19: Secondary | ICD-10-CM | POA: Diagnosis present

## 2021-04-19 DIAGNOSIS — Z880 Allergy status to penicillin: Secondary | ICD-10-CM | POA: Diagnosis not present

## 2021-04-19 DIAGNOSIS — I712 Thoracic aortic aneurysm, without rupture: Secondary | ICD-10-CM | POA: Diagnosis present

## 2021-04-19 DIAGNOSIS — J984 Other disorders of lung: Secondary | ICD-10-CM | POA: Diagnosis present

## 2021-04-19 DIAGNOSIS — Z681 Body mass index (BMI) 19 or less, adult: Secondary | ICD-10-CM | POA: Diagnosis not present

## 2021-04-19 DIAGNOSIS — Z888 Allergy status to other drugs, medicaments and biological substances status: Secondary | ICD-10-CM | POA: Diagnosis not present

## 2021-04-19 DIAGNOSIS — I2510 Atherosclerotic heart disease of native coronary artery without angina pectoris: Secondary | ICD-10-CM | POA: Diagnosis present

## 2021-04-19 DIAGNOSIS — Z91040 Latex allergy status: Secondary | ICD-10-CM | POA: Diagnosis not present

## 2021-04-19 DIAGNOSIS — Z79899 Other long term (current) drug therapy: Secondary | ICD-10-CM | POA: Diagnosis not present

## 2021-04-19 DIAGNOSIS — E785 Hyperlipidemia, unspecified: Secondary | ICD-10-CM | POA: Diagnosis present

## 2021-04-19 DIAGNOSIS — I10 Essential (primary) hypertension: Secondary | ICD-10-CM | POA: Diagnosis present

## 2021-04-19 DIAGNOSIS — Z950 Presence of cardiac pacemaker: Secondary | ICD-10-CM | POA: Diagnosis not present

## 2021-04-19 DIAGNOSIS — I442 Atrioventricular block, complete: Secondary | ICD-10-CM | POA: Diagnosis present

## 2021-04-19 DIAGNOSIS — C7989 Secondary malignant neoplasm of other specified sites: Secondary | ICD-10-CM | POA: Diagnosis not present

## 2021-04-19 DIAGNOSIS — E871 Hypo-osmolality and hyponatremia: Secondary | ICD-10-CM | POA: Diagnosis present

## 2021-04-19 DIAGNOSIS — Z87891 Personal history of nicotine dependence: Secondary | ICD-10-CM | POA: Diagnosis not present

## 2021-04-19 DIAGNOSIS — R195 Other fecal abnormalities: Secondary | ICD-10-CM | POA: Diagnosis present

## 2021-04-19 DIAGNOSIS — K8689 Other specified diseases of pancreas: Secondary | ICD-10-CM | POA: Diagnosis not present

## 2021-04-19 DIAGNOSIS — C259 Malignant neoplasm of pancreas, unspecified: Secondary | ICD-10-CM | POA: Diagnosis present

## 2021-04-19 DIAGNOSIS — K769 Liver disease, unspecified: Secondary | ICD-10-CM | POA: Diagnosis present

## 2021-04-19 DIAGNOSIS — R791 Abnormal coagulation profile: Secondary | ICD-10-CM | POA: Diagnosis not present

## 2021-04-19 DIAGNOSIS — E43 Unspecified severe protein-calorie malnutrition: Secondary | ICD-10-CM | POA: Diagnosis present

## 2021-04-19 DIAGNOSIS — E861 Hypovolemia: Secondary | ICD-10-CM | POA: Diagnosis present

## 2021-04-19 DIAGNOSIS — C787 Secondary malignant neoplasm of liver and intrahepatic bile duct: Secondary | ICD-10-CM | POA: Diagnosis not present

## 2021-04-19 DIAGNOSIS — Z8249 Family history of ischemic heart disease and other diseases of the circulatory system: Secondary | ICD-10-CM | POA: Diagnosis not present

## 2021-04-19 DIAGNOSIS — I471 Supraventricular tachycardia: Secondary | ICD-10-CM | POA: Diagnosis present

## 2021-04-19 DIAGNOSIS — Z7901 Long term (current) use of anticoagulants: Secondary | ICD-10-CM | POA: Diagnosis not present

## 2021-04-19 DIAGNOSIS — C801 Malignant (primary) neoplasm, unspecified: Secondary | ICD-10-CM | POA: Diagnosis present

## 2021-04-19 DIAGNOSIS — Z515 Encounter for palliative care: Secondary | ICD-10-CM | POA: Diagnosis not present

## 2021-04-19 LAB — COMPREHENSIVE METABOLIC PANEL
ALT: 26 U/L (ref 0–44)
AST: 37 U/L (ref 15–41)
Albumin: 2.5 g/dL — ABNORMAL LOW (ref 3.5–5.0)
Alkaline Phosphatase: 148 U/L — ABNORMAL HIGH (ref 38–126)
Anion gap: 11 (ref 5–15)
BUN: 13 mg/dL (ref 8–23)
CO2: 25 mmol/L (ref 22–32)
Calcium: 8.8 mg/dL — ABNORMAL LOW (ref 8.9–10.3)
Chloride: 96 mmol/L — ABNORMAL LOW (ref 98–111)
Creatinine, Ser: 0.69 mg/dL (ref 0.44–1.00)
GFR, Estimated: >60 mL/min (ref >60)
Glucose, Bld: 97 mg/dL (ref 70–99)
Potassium: 4.6 mmol/L (ref 3.5–5.1)
Sodium: 132 mmol/L — ABNORMAL LOW (ref 135–145)
Total Bilirubin: 1 mg/dL (ref 0.3–1.2)
Total Protein: 5.3 g/dL — ABNORMAL LOW (ref 6.5–8.1)

## 2021-04-19 LAB — CBC
HCT: 31.5 % — ABNORMAL LOW (ref 36.0–46.0)
Hemoglobin: 10.3 g/dL — ABNORMAL LOW (ref 12.0–15.0)
MCH: 28 pg (ref 26.0–34.0)
MCHC: 32.7 g/dL (ref 30.0–36.0)
MCV: 85.6 fL (ref 80.0–100.0)
Platelets: 230 10*3/uL (ref 150–400)
RBC: 3.68 MIL/uL — ABNORMAL LOW (ref 3.87–5.11)
RDW: 14.5 % (ref 11.5–15.5)
WBC: 13.2 10*3/uL — ABNORMAL HIGH (ref 4.0–10.5)
nRBC: 0 % (ref 0.0–0.2)

## 2021-04-19 LAB — PROTIME-INR
INR: >10 (ref 0.8–1.2)
Prothrombin Time: 81.6 seconds — ABNORMAL HIGH (ref 11.4–15.2)

## 2021-04-19 MED ORDER — PHYTONADIONE 5 MG PO TABS
5.0000 mg | ORAL_TABLET | Freq: Once | ORAL | Status: AC
  Administered 2021-04-19: 5 mg via ORAL
  Filled 2021-04-19 (×2): qty 1

## 2021-04-19 MED ORDER — PHYTONADIONE 5 MG PO TABS
2.5000 mg | ORAL_TABLET | Freq: Once | ORAL | Status: AC
  Administered 2021-04-19: 2.5 mg via ORAL
  Filled 2021-04-19: qty 1

## 2021-04-19 NOTE — Plan of Care (Signed)
  Problem: Nutrition: Goal: Adequate nutrition will be maintained Outcome: Progressing   

## 2021-04-19 NOTE — Progress Notes (Signed)
Patient refused Vitamin K tablet. She requested to take it after breakfast. Rescheduled dose. Informed incoming RN.

## 2021-04-19 NOTE — Progress Notes (Signed)
PROGRESS NOTE    [name] [name]  JYN:[id number] DOB: 24-Jun-1946 DOA: 04/17/2021 PCP: Leeroy Cha, MD   Brief Narrative:  [name] [name] is a 75 y.o. 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  Initial symptoms began around January of this year with some 20 to 30 pound weight loss.  Patient's INR was incidentally noted to be greater than 10.  CT abdomen without contrast also shows questionable lesion x2 in the liver.  Oncology consulted requesting further imaging for possible staging while admitted for supratherapeutic INR.  Assessment & Plan:   Principal Problem:   Supratherapeutic INR Active Problems:   H/O aortic valve replacement, 23 mm Carbomedics   Complete heart block (HCC)   Paroxysmal atrial tachycardia (HCC)   Liver lesion   Hyponatremia  Supratherapeutic INR; mechanical aortic valve   - Patient with mechanical valve presents with loss of appetite, weakness, and wt-loss and is found to have INR >10 without bleeding  - 2.5 mg PO vit K repeat dose today, continue to hold warfarin and repeat INR in am   Pancreatic 3cm lesion ?primary Small cavitary pulm nodules - extensive mets Incidentally noted liver lesions, concerning for metastatic disease of unknown primary - Non-contrast CT reveals multiple hypodense liver lesions concerning for metastases  - Per MRI tech, patient unable to undergo MRI due to her pacer  - Screening colonoscopy in 2016 notable for diverticulosis and hemorrhoids only; patient reports being up to date with  mammograms  - Oncology consulted recommending AFP with morning labs, CT chest abdomen pelvis with contrast per discussion with radiology, despite contrast shortage she does qualify for emergent use given initial staging, without which would delay her care    Hypovolemic hyponatremia  - Serum sodium is 129 on admission in setting of hypovolemia  - Hydrate gently with NS, monitor    Essential hypertension  - Continue lisinopril and metoprolol   DVT prophylaxis: NONE currently --> supratherapeutic INR  Code Status: Full  Family Communication: Son at bedside  Status is: Inpatient  Dispo: The patient is from: Home              Anticipated d/c is to: To be determined              Anticipated d/c date is: 48 to 72 hours not              Patient currently at medically stable for discharge  Consultants:   Oncology  Procedures:   None  Antimicrobials:  None indicated  Subjective: No acute issues or events overnight; early this morning patient was very combative and confused about where she was - she is AOx3 but unclear about why she is in the hospital accusing staff of 'hiding information' from her but she does not recall our lengthy conversation in the ED with her son yesterday.  Objective: Vitals:   04/18/21 1618 04/18/21 2026 04/18/21 2355 04/19/21 0409  BP: 116/67 126/79 135/84 129/88  Pulse: 79 (!) 109 92 89  Resp: 17 18 19 19   Temp: 98.1 F (36.7 C) 98.5 F (36.9 C) 98.5 F (36.9 C) 98.3 F (36.8 C)  TempSrc: Oral Oral Oral Oral  SpO2: 98% 96% 96% 96%  Weight:      Height:        Intake/Output Summary (Last 24 hours) at 04/19/2021 0721 Last data filed at 04/19/2021 0558 Gross per 24 hour  Intake 1480 ml  Output --  Net 1480 ml   Filed Weights   04/17/21 1814  Weight: 48.1 kg    Examination:  General:  Pleasantly resting in bed, No acute distress.  Profoundly cachectic in appearance HEENT:  Normocephalic atraumatic.  Sclerae nonicteric, noninjected.   Extraocular movements intact bilaterally. Neck:  Without mass or deformity.  Trachea is midline. Lungs:  Clear to auscultate bilaterally without rhonchi, wheeze, or rales. Heart:  Regular rate and rhythm.  Without murmurs, rubs, or gallops. Abdomen:  Soft, nontender, nondistended.  Without guarding or rebound. Extremities: Without cyanosis, clubbing, edema, or obvious deformity. Vascular:  Dorsalis pedis and posterior tibial pulses palpable bilaterally. Skin:  Warm and dry, no erythema, no ulcerations.  Data Reviewed: I have personally reviewed following labs and imaging studies  CBC: Recent Labs  Lab 04/17/21 1900 04/18/21 0229 04/19/21 0242  WBC 15.4* 14.3* 13.2*  NEUTROABS 12.8*  --   --   HGB 11.2* 10.7* 10.3*  HCT 34.6* 32.8* 31.5*  MCV 85.9 86.1 85.6  PLT 305 236 941   Basic Metabolic Panel: Recent Labs  Lab 04/17/21 1900 04/18/21 0229 04/19/21 0242  NA 129* 129* 132*  K 4.4 4.0 4.6  CL 92* 93* 96*  CO2 27 27 25   GLUCOSE 120* 145* 97  BUN 15 15 13   CREATININE 0.80 0.80 0.69  CALCIUM 9.3 8.9 8.8*   GFR: Estimated Creatinine Clearance: 44.7 mL/min (by C-G formula based on SCr of 0.69 mg/dL). Liver Function Tests: Recent Labs  Lab 04/17/21 1900 04/18/21 0229 04/19/21 0242  AST 40 31 37  ALT 28 27 26   ALKPHOS 147* 133* 148*  BILITOT 1.1 1.2 1.0  PROT 6.3* 5.7* 5.3*  ALBUMIN 2.9* 2.6* 2.5*   Recent Labs  Lab 04/17/21 1900  LIPASE 37   No results for input(s): AMMONIA in the last 168 hours. Coagulation Profile: Recent Labs  Lab 04/17/21 1900 04/18/21 0229 04/19/21 0242  INR >10.0* >10.0* >10.0*   Cardiac Enzymes: No results for input(s): CKTOTAL, CKMB, CKMBINDEX, TROPONINI in the last 168 hours. BNP (last 3 results) No results for input(s): PROBNP in the last 8760 hours. HbA1C: No results for input(s): HGBA1C in the last 72 hours. CBG: No results for input(s): GLUCAP in the last 168 hours. Lipid Profile: No results for input(s): CHOL, HDL,  LDLCALC, TRIG, CHOLHDL, LDLDIRECT in the last 72 hours. Thyroid Function Tests: No results for input(s): TSH, T4TOTAL, FREET4, T3FREE, THYROIDAB in the last 72 hours. Anemia Panel: No results for input(s): VITAMINB12, FOLATE, FERRITIN, TIBC, IRON, RETICCTPCT in the last 72 hours. Sepsis Labs: No results for input(s): PROCALCITON, LATICACIDVEN in the last 168 hours.  Recent Results (from the past 240 hour(s))  SARS CORONAVIRUS 2 (TAT 6-24 HRS) Nasopharyngeal Nasopharyngeal Swab     Status: None   Collection Time: 04/18/21  1:45 AM   Specimen: Nasopharyngeal Swab  Result Value Ref Range Status   SARS Coronavirus 2 NEGATIVE NEGATIVE Final    Comment: (NOTE) SARS-CoV-2 target nucleic acids are NOT DETECTED.  The SARS-CoV-2 RNA is generally detectable in upper and lower respiratory specimens during the acute phase of infection. Negative results do not preclude SARS-CoV-2 infection, do not rule out co-infections with other pathogens, and should  not be used as the sole basis for treatment or other patient management decisions. Negative results must be combined with clinical observations, patient history, and epidemiological information. The expected result is Negative.  Fact Sheet for Patients: SugarRoll.be  Fact Sheet for Healthcare Providers: https://www.woods-mathews.com/  This test is not yet approved or cleared by the [place] FDA and  has been authorized for detection and/or diagnosis of SARS-CoV-2 by FDA under an Emergency Use Authorization (EUA). This EUA will remain  in effect (meaning this test can be used) for the duration of the COVID-19 declaration under Se ction 564(b)(1) of the Act, 21 U.S.C. section 360bbb-3(b)(1), unless the authorization is terminated or revoked sooner.  Performed at [facility], [place] [address] [place], [place] [address]          Radiology Studies: CT ABDOMEN PELVIS WO CONTRAST  Result  Date: 04/17/2021 CLINICAL DATA:  Nausea and vomiting EXAM: CT ABDOMEN AND PELVIS WITHOUT CONTRAST TECHNIQUE: Multidetector CT imaging of the abdomen and pelvis was performed following the standard protocol without IV contrast. COMPARISON:  None. FINDINGS: LOWER CHEST: Normal. HEPATOBILIARY: There are multiple hypodense lesions within the liver, which are poorly characterized on this noncontrast study. The largest lesion is in the right hepatic lobe and measures 3.3 cm. There is a 3.0 cm lesion in the left hepatic lobe. The gallbladder is normal. PANCREAS: Normal pancreas. No ductal dilatation or peripancreatic fluid collection. SPLEEN: Normal. ADRENALS/URINARY TRACT: The adrenal glands are normal. Nonobstructing calculus in the interpolar left kidney measuring 3 mm. The urinary bladder is normal for degree of distention STOMACH/BOWEL: There is no hiatal hernia. Normal duodenal course and caliber. No small bowel dilatation or inflammation. No focal colonic abnormality. Normal appendix. VASCULAR/LYMPHATIC: There is calcific atherosclerosis of the abdominal aorta. No lymphadenopathy. REPRODUCTIVE: Partially calcified uterine fibroid. MUSCULOSKELETAL. No bony spinal canal stenosis or focal osseous abnormality. OTHER: None. IMPRESSION: 1. No acute abnormality of the abdomen or pelvis. 2. Multiple hypodense lesions within the liver, poorly characterized on this noncontrast study. These are concerning for metastatic disease. Abdominal MRI with and without contrast is recommended for further characterization. 3. Nonobstructing 3 mm left renal calculus. Aortic Atherosclerosis (ICD10-I70.0). Electronically Signed   By: Ulyses Jarred M.D.   On: 04/17/2021 20:05   DG Chest 1 View  Result Date: 04/18/2021 CLINICAL DATA:  One month of loss of appetite worsening weakness EXAM: CHEST  1 VIEW COMPARISON:  May 07, 2010 FINDINGS: Left chest pacemaker with leads overlying the right atrium and right ventricle. Prior median  sternotomy and aortic valve replacement. The heart size and mediastinal contours are within normal limits. No focal consolidation. Blunting of the right hemidiaphragm, similar prior. No visible pleural effusion or pneumothorax. The visualized skeletal structures are unremarkable. IMPRESSION: No acute cardiopulmonary findings. Electronically Signed   By: Dahlia Bailiff MD   On: 04/18/2021 00:43   CT CHEST ABDOMEN PELVIS W CONTRAST  Result Date: 04/18/2021 CLINICAL DATA:  Metastatic liver lesions on recent abdominal/pelvic CT scan. EXAM: CT CHEST, ABDOMEN, AND PELVIS WITH CONTRAST TECHNIQUE: Multidetector CT imaging of the chest, abdomen and pelvis was performed following the standard protocol during bolus administration of intravenous contrast. CONTRAST:  75mL OMNIPAQUE IOHEXOL 300 MG/ML  SOLN COMPARISON:  Noncontrast abdominal/pelvic CT 04/17/2021 FINDINGS: CT CHEST FINDINGS Cardiovascular: The heart is normal in size. No pericardial effusion. There is tortuosity, ectasia and calcification of the thoracic aorta. Evidence of prior aortic valve replacement surgery. No dissection. The branch vessels are patent. Three-vessel coronary artery  calcifications are noted. Pacer wires are in good position without complicating features. Mediastinum/Nodes: No mediastinal or hilar mass or lymphadenopathy. The esophagus is unremarkable. The thyroid gland is unremarkable. Lungs/Pleura: Emphysematous changes are noted along with areas of pulmonary scarring. There are small cavitary nodules noted bilaterally. 3.5 mm left upper lobe nodule on image 34/5. 3 mm right upper lobe pulmonary nodule on image number 45/5. 8 mm nodule in the left upper lobe image 76/5. 4.5 mm nodule in the right upper lobe on image number 44/. Moderate nodularity along the right minor and right major fissures likely benign lymph nodes. No pleural effusions or pleural nodules. Musculoskeletal: The bony thorax is intact. No supraclavicular or axillary  adenopathy. CT ABDOMEN PELVIS FINDINGS Hepatobiliary: As demonstrated on yesterday's noncontrast CT scan there are several hepatic lesions suspicious for metastatic disease. There is a infiltrating lesion in the left hepatic lobe with very ill-defined margins and probable associated biliary dilatation. There is also associated capsular retraction. This lesion measures approximately 9.5 x 8.4 x 4.0 cm. 3.8 x 3.5 cm lesion in the medial aspect of segment 2 on image 66/3. Segment 6 lesion on image 54/3 measures 3.9 x 3.3 cm. Lower segment 6 lesion on image 66/3 measures 2.8 x 2.6 cm. The gallbladder is contracted.  No common bile duct dilatation. Pancreas: There is a 3 cm low-attenuation lesion in the pancreatic body/tail junction region most likely representing the patient's primary neoplasm although it is possible this could be metastatic I think that is less likely. The head and body regions are unremarkable. No definite involvement of the posterior wall the stomach with the spleen. Spleen: Normal size. No focal lesions. The splenic vein is occluded. Adrenals/Urinary Tract: Adrenal glands and kidneys are unremarkable. No renal lesions or hydronephrosis. The bladder is unremarkable. Stomach/Bowel: The stomach, duodenum, small bowel and colon are unremarkable. No acute inflammatory changes, mass lesions or obstructive findings. Vascular/Lymphatic: Advanced vascular calcifications but no aneurysm or dissection. The branch vessels are patent. The major venous structures are patent except for the splenic vein. No enlarged peripancreatic, gastrohepatic or retroperitoneal lymphadenopathy. Reproductive: The uterus and ovaries are unremarkable. Other: No pelvic adenopathy or inguinal adenopathy. No significant free pelvic fluid collections. Musculoskeletal: No significant bony findings. No findings suspicious for osseous metastatic disease. IMPRESSION: 1. 3 cm low-attenuation lesion in the pancreatic body/tail junction  region most likely representing the patient's primary neoplasm. 2. Extensive hepatic metastatic disease. 3. Small cavitary pulmonary nodules consistent with metastatic disease. 4. Splenic vein occlusion. 5. No mediastinal or hilar mass or adenopathy. 6. Advanced atherosclerotic calcifications involving the thoracic and abdominal aorta and branch vessels including the coronary arteries. 7. Emphysematous changes and pulmonary scarring. Aortic Atherosclerosis (ICD10-I70.0) and Emphysema (ICD10-J43.9). Electronically Signed   By: Marijo Sanes M.D.   On: 04/18/2021 15:45   Scheduled Meds: . atorvastatin  40 mg Oral Daily  . feeding supplement  237 mL Oral TID BM  . lisinopril  20 mg Oral Daily  . metoprolol tartrate  50 mg Oral BID  . mirtazapine  15 mg Oral QHS  . phytonadione  5 mg Oral Once   Continuous Infusions:    LOS: 0 days   Time spent: 79min  Aden Youngman C Rhilynn Preyer, DO [facility]  If 7PM-7AM, please contact night-coverage [email or web address]  04/19/2021, 7:21 AM

## 2021-04-19 NOTE — Progress Notes (Signed)
PT Cancellation Note  Patient Details Name: [name] [name] MRN: [id number] DOB: 1946/07/01   Cancelled Treatment:    Reason Eval/Treat Not Completed: Patient declined, no reason specified. Pt asleep and refusing therapy. Asked pt if she wanted to get up to have breakfast. Pt refused. Will try again time permitting.  Lyanne Co, DPT Acute Rehabilitation Services [phone]   [place] 04/19/2021, 10:06 AM

## 2021-04-19 NOTE — Consult Note (Addendum)
[place]  [phone] [phone]   [place]  Referral MD: Dr. Holli Humbles  Reason for Referral: Pancreatic mass, liver lesions, pulmonary nodules  HPI: [name] [name] is a 75 year old female with a past medical history significant for Bentall repair of small a sending aortic aneurysm with mechanical aortic valve replacement, heart block with pacemaker, paroxysmal atrial tachycardia, hypertension, remote history of tobacco abuse.  She presented to the emergency room for generalized weakness, loss of appetite, and weight loss.  She developed a poor appetite beginning in January 2022.  She has been progressively fatigued and had generalized weakness and reported 20 pound weight loss since this time.  Admission lab work significant for WBC 15.4, hemoglobin 11.2, sodium 129, albumin 2.9, alk phos 147.  Her PT was greater than 90 and INR was greater than 10.  She initially had a CT of the abdomen/pelvis without contrast which showed no acute abnormality but showed multiple hypodense lesions within the liver which are poorly characterized on a noncontrast study but lesions concerning for metastatic disease.  This was followed with a CT of the abdomen/pelvis with contrast which showed a 3 cm low-attenuation lesion in the pancreatic body/tail junction region most likely representing neoplasm, extensive hepatic metastatic disease, small cavitary pulmonary nodules consistent with metastatic disease, splenic vein occlusion.  Due to her elevated INR, she was given vitamin K 2.5 mg p.o. x1.  Repeat INR remains above 10 and she received an additional dose of vitamin K 5 mg p.o. this morning.  An AFP was drawn this morning and results are currently pending.  A CA 19.9 and CEA have been ordered, but the patient refused a blood draw.  I confirmed with the lab that do not have enough blood to add these tests on to today's lab work.  The patient is  currently sitting up in recliner.  She walked with PT earlier today.  Her 2 sons are at the bedside.  Nursing reports that she had some mild confusion this morning and was somewhat tearful.  She has been better since her sons have been present.  The patient reports fatigue and generalized weakness.  She has had a poor appetite and weight loss since January.  Her somewhat estimate that she has lost about 25 pounds overall.  Her son reports that she has had some mild intermittent nausea but no vomiting.  She has also had discomfort in her upper abdomen, legs, and lower back.  She reports mild dizziness.  She denies fevers, chills, headaches.  Denies chest pain and shortness of breath.  She denies constipation diarrhea.  Reports that she has not had any bleeding such as epistaxis, hemoptysis, hematemesis, hematuria, melena, hematochezia.  The patient is widowed.  She has 3 adult children, 2 sons and 1 daughter.  Denies alcohol use.  Previously smoked cigarettes but quit in 2011. Medical oncology was asked see the patient for recommendations regarding her pancreatic mass, liver lesions, and pulmonary nodules.    Past Medical History:  Diagnosis Date  . Aortic stenosis 03/07/2010   replaced mechanical valve Bentall procedure\  . Atrial fibrillation ([place])   . Coronary artery disease    Dr. Avon Gully  . Dyslipidemia   . Hypertension   . Intermittent complete heart block ([place])    H/O  . Presence of permanent cardiac pacemaker   . S/P AAA repair 03/07/2010  . Systemic hypertension   :  Past Surgical History:  Procedure Laterality Date  .  ABDOMINAL AORTIC ANEURYSM REPAIR  03/07/2010  . AORTIC VALVE REPLACEMENT  03/07/2010   mechanical valve Bentall procedure  . CARDIAC CATHETERIZATION  08/25/2009   severe AS, mild CAD  . COLONOSCOPY WITH PROPOFOL N/A 09/12/2015   Procedure: COLONOSCOPY WITH PROPOFOL;  Surgeon: Juanita Craver, MD;  Location: [facility];  Service: Endoscopy;  Laterality: N/A;  . PACEMAKER  INSERTION  03/13/2010   [employer] Adapta  . US ECHOCARDIOGRAPHY  08/09/2011   mod. concentric LVH,LA mildly dilated,ca+ MV,trace TR and AI,mechanical AOV  :  Current Facility-Administered Medications  Medication Dose Route Frequency Provider Last Rate Last Admin  . acetaminophen (TYLENOL) tablet 650 mg  650 mg Oral Q6H PRN Opyd, Ilene Qua, MD       Or  . acetaminophen (TYLENOL) suppository 650 mg  650 mg Rectal Q6H PRN Opyd, Ilene Qua, MD      . atorvastatin (LIPITOR) tablet 40 mg  40 mg Oral Daily Opyd, Ilene Qua, MD   40 mg at 04/18/21 0954  . bisacodyl (DULCOLAX) suppository 10 mg  10 mg Rectal Daily PRN Opyd, Ilene Qua, MD      . feeding supplement (ENSURE ENLIVE / ENSURE PLUS) liquid 237 mL  237 mL Oral TID BM Little Ishikawa, MD   237 mL at 04/18/21 2031  . lisinopril (ZESTRIL) tablet 20 mg  20 mg Oral Daily Opyd, Ilene Qua, MD   20 mg at 04/18/21 0954  . metoprolol tartrate (LOPRESSOR) tablet 50 mg  50 mg Oral BID Opyd, Ilene Qua, MD   50 mg at 04/18/21 2036  . mirtazapine (REMERON) tablet 15 mg  15 mg Oral QHS Opyd, Ilene Qua, MD   15 mg at 04/18/21 2037  . ondansetron (ZOFRAN) tablet 4 mg  4 mg Oral Q6H PRN Opyd, Ilene Qua, MD       Or  . ondansetron (ZOFRAN) injection 4 mg  4 mg Intravenous Q6H PRN Opyd, Ilene Qua, MD      . oxyCODONE (Oxy IR/ROXICODONE) immediate release tablet 5 mg  5 mg Oral Q6H PRN Opyd, Ilene Qua, MD      . phytonadione (VITAMIN K) tablet 5 mg  5 mg Oral Once Chotiner, Yevonne Aline, MD      . senna-docusate (Senokot-S) tablet 1 tablet  1 tablet Oral QHS PRN Opyd, Ilene Qua, MD         Allergies  Allergen Reactions  . Tizanidine Hcl     Other reaction(s): Hallucinations, nausea  . Crestor [Rosuvastatin Calcium] Other (See Comments)    Stomach cramps  . Latex Hives and Rash  . Penicillins Other (See Comments)    Has patient had a PCN reaction causing immediate rash, facial/tongue/throat swelling, SOB or lightheadedness with hypotension: unknown Has  patient had a PCN reaction causing severe rash involving mucus membranes or skin necrosis: unknown Has patient had a PCN reaction that required hospitalization already in the hospital when it happened Has patient had a PCN reaction occurring within the last 10 years: No If all of the above answers are "NO", then may proceed with Cephalosporin use.   :  Family History  Problem Relation Age of Onset  . Diabetes Mother   . Hypertension Mother   . Heart failure Brother   . Hypertension Brother   . Diabetes Brother   :  Social History   Socioeconomic History  . Marital status: Widowed    Spouse name: Not on file  . Number of children: Not on file  . Years of  education: Not on file  . Highest education level: Not on file  Occupational History  . Not on file  Tobacco Use  . Smoking status: Former Smoker    Quit date: 12/02/2009    Years since quitting: 11.3  . Smokeless tobacco: Never Used  Substance and Sexual Activity  . Alcohol use: No  . Drug use: No  . Sexual activity: Never  Other Topics Concern  . Not on file  Social History Narrative  . Not on file   Social Determinants of Health   Financial Resource Strain: Not on file  Food Insecurity: Not on file  Transportation Needs: Not on file  Physical Activity: Not on file  Stress: Not on file  Social Connections: Not on file  Intimate Partner Violence: Not on file  :  Review of Systems: A comprehensive 14 point review of systems was negative except as noted in the HPI.  Exam: Patient Vitals for the past 24 hrs:  BP Temp Temp src Pulse Resp SpO2  04/19/21 0941 (!) 122/59 98.1 F (36.7 C) Oral 81 19 96 %  04/19/21 0409 129/88 98.3 F (36.8 C) Oral 89 19 96 %  04/18/21 2355 135/84 98.5 F (36.9 C) Oral 92 19 96 %  04/18/21 2026 126/79 98.5 F (36.9 C) Oral (!) 109 18 96 %  04/18/21 1618 116/67 98.1 F (36.7 C) Oral 79 17 98 %  04/18/21 1434 110/66 97.6 F (36.4 C) Oral 79 -- 99 %  04/18/21 1344 (!) 105/56 --  -- 80 20 99 %    General: Chronically ill-appearing female, cachectic, no distress. Eyes:  no scleral icterus.   ENT:  There were no oropharyngeal lesions.   Lymphatics:  Negative cervical, supraclavicular or axillary adenopathy.   Respiratory: lungs were clear bilaterally without wheezing or crackles.   Cardiovascular:  Regular rate and rhythm, S1/S2, without murmur, rub or gallop.  There was no pedal edema.   GI:  abdomen was soft, flat, nontender, nondistended, without organomegaly.   Skin exam was without echymosis, petichae.   Neuro exam was nonfocal. Patient was alert and oriented.  Attention was good.   Language was appropriate.  Mood was normal without depression.  Speech was not pressured.  Thought content was not tangential.     Lab Results  Component Value Date   WBC 13.2 (H) 04/19/2021   HGB 10.3 (L) 04/19/2021   HCT 31.5 (L) 04/19/2021   PLT 230 04/19/2021   GLUCOSE 97 04/19/2021   CHOL 178 11/06/2020   TRIG 58 11/06/2020   HDL 79 11/06/2020   LDLCALC 88 11/06/2020   ALT 26 04/19/2021   AST 37 04/19/2021   NA 132 (L) 04/19/2021   K 4.6 04/19/2021   CL 96 (L) 04/19/2021   CREATININE 0.69 04/19/2021   BUN 13 04/19/2021   CO2 25 04/19/2021    CT ABDOMEN PELVIS WO CONTRAST  Result Date: 04/17/2021 CLINICAL DATA:  Nausea and vomiting EXAM: CT ABDOMEN AND PELVIS WITHOUT CONTRAST TECHNIQUE: Multidetector CT imaging of the abdomen and pelvis was performed following the standard protocol without IV contrast. COMPARISON:  None. FINDINGS: LOWER CHEST: Normal. HEPATOBILIARY: There are multiple hypodense lesions within the liver, which are poorly characterized on this noncontrast study. The largest lesion is in the right hepatic lobe and measures 3.3 cm. There is a 3.0 cm lesion in the left hepatic lobe. The gallbladder is normal. PANCREAS: Normal pancreas. No ductal dilatation or peripancreatic fluid collection. SPLEEN: Normal. ADRENALS/URINARY TRACT: The adrenal glands  are  normal. Nonobstructing calculus in the interpolar left kidney measuring 3 mm. The urinary bladder is normal for degree of distention STOMACH/BOWEL: There is no hiatal hernia. Normal duodenal course and caliber. No small bowel dilatation or inflammation. No focal colonic abnormality. Normal appendix. VASCULAR/LYMPHATIC: There is calcific atherosclerosis of the abdominal aorta. No lymphadenopathy. REPRODUCTIVE: Partially calcified uterine fibroid. MUSCULOSKELETAL. No bony spinal canal stenosis or focal osseous abnormality. OTHER: None. IMPRESSION: 1. No acute abnormality of the abdomen or pelvis. 2. Multiple hypodense lesions within the liver, poorly characterized on this noncontrast study. These are concerning for metastatic disease. Abdominal MRI with and without contrast is recommended for further characterization. 3. Nonobstructing 3 mm left renal calculus. Aortic Atherosclerosis (ICD10-I70.0). Electronically Signed   By: Ulyses Jarred M.D.   On: 04/17/2021 20:05   DG Chest 1 View  Result Date: 04/18/2021 CLINICAL DATA:  One month of loss of appetite worsening weakness EXAM: CHEST  1 VIEW COMPARISON:  May 07, 2010 FINDINGS: Left chest pacemaker with leads overlying the right atrium and right ventricle. Prior median sternotomy and aortic valve replacement. The heart size and mediastinal contours are within normal limits. No focal consolidation. Blunting of the right hemidiaphragm, similar prior. No visible pleural effusion or pneumothorax. The visualized skeletal structures are unremarkable. IMPRESSION: No acute cardiopulmonary findings. Electronically Signed   By: Dahlia Bailiff MD   On: 04/18/2021 00:43   DG Thoracic Spine W/Swimmers  Result Date: 04/12/2021 CLINICAL DATA:  Chronic mid and lower thoracic spine pain. No known injury. EXAM: THORACIC SPINE - 3 VIEWS COMPARISON:  None. FINDINGS: No fracture or listhesis. No focal bony lesion. Mild multilevel anterior endplate spurring is noted. IMPRESSION:  Mild degenerative disease.  Otherwise negative. Electronically Signed   By: Inge Rise M.D.   On: 04/12/2021 10:28   CT CHEST ABDOMEN PELVIS W CONTRAST  Result Date: 04/18/2021 CLINICAL DATA:  Metastatic liver lesions on recent abdominal/pelvic CT scan. EXAM: CT CHEST, ABDOMEN, AND PELVIS WITH CONTRAST TECHNIQUE: Multidetector CT imaging of the chest, abdomen and pelvis was performed following the standard protocol during bolus administration of intravenous contrast. CONTRAST:  77m OMNIPAQUE IOHEXOL 300 MG/ML  SOLN COMPARISON:  Noncontrast abdominal/pelvic CT 04/17/2021 FINDINGS: CT CHEST FINDINGS Cardiovascular: The heart is normal in size. No pericardial effusion. There is tortuosity, ectasia and calcification of the thoracic aorta. Evidence of prior aortic valve replacement surgery. No dissection. The branch vessels are patent. Three-vessel coronary artery calcifications are noted. Pacer wires are in good position without complicating features. Mediastinum/Nodes: No mediastinal or hilar mass or lymphadenopathy. The esophagus is unremarkable. The thyroid gland is unremarkable. Lungs/Pleura: Emphysematous changes are noted along with areas of pulmonary scarring. There are small cavitary nodules noted bilaterally. 3.5 mm left upper lobe nodule on image 34/5. 3 mm right upper lobe pulmonary nodule on image number 45/5. 8 mm nodule in the left upper lobe image 76/5. 4.5 mm nodule in the right upper lobe on image number 44/. Moderate nodularity along the right minor and right major fissures likely benign lymph nodes. No pleural effusions or pleural nodules. Musculoskeletal: The bony thorax is intact. No supraclavicular or axillary adenopathy. CT ABDOMEN PELVIS FINDINGS Hepatobiliary: As demonstrated on yesterday's noncontrast CT scan there are several hepatic lesions suspicious for metastatic disease. There is a infiltrating lesion in the left hepatic lobe with very ill-defined margins and probable  associated biliary dilatation. There is also associated capsular retraction. This lesion measures approximately 9.5 x 8.4 x 4.0 cm. 3.8 x 3.5 cm lesion  in the medial aspect of segment 2 on image 66/3. Segment 6 lesion on image 54/3 measures 3.9 x 3.3 cm. Lower segment 6 lesion on image 66/3 measures 2.8 x 2.6 cm. The gallbladder is contracted.  No common bile duct dilatation. Pancreas: There is a 3 cm low-attenuation lesion in the pancreatic body/tail junction region most likely representing the patient's primary neoplasm although it is possible this could be metastatic I think that is less likely. The head and body regions are unremarkable. No definite involvement of the posterior wall the stomach with the spleen. Spleen: Normal size. No focal lesions. The splenic vein is occluded. Adrenals/Urinary Tract: Adrenal glands and kidneys are unremarkable. No renal lesions or hydronephrosis. The bladder is unremarkable. Stomach/Bowel: The stomach, duodenum, small bowel and colon are unremarkable. No acute inflammatory changes, mass lesions or obstructive findings. Vascular/Lymphatic: Advanced vascular calcifications but no aneurysm or dissection. The branch vessels are patent. The major venous structures are patent except for the splenic vein. No enlarged peripancreatic, gastrohepatic or retroperitoneal lymphadenopathy. Reproductive: The uterus and ovaries are unremarkable. Other: No pelvic adenopathy or inguinal adenopathy. No significant free pelvic fluid collections. Musculoskeletal: No significant bony findings. No findings suspicious for osseous metastatic disease. IMPRESSION: 1. 3 cm low-attenuation lesion in the pancreatic body/tail junction region most likely representing the patient's primary neoplasm. 2. Extensive hepatic metastatic disease. 3. Small cavitary pulmonary nodules consistent with metastatic disease. 4. Splenic vein occlusion. 5. No mediastinal or hilar mass or adenopathy. 6. Advanced atherosclerotic  calcifications involving the thoracic and abdominal aorta and branch vessels including the coronary arteries. 7. Emphysematous changes and pulmonary scarring. Aortic Atherosclerosis (ICD10-I70.0) and Emphysema (ICD10-J43.9). Electronically Signed   By: Marijo Sanes M.D.   On: 04/18/2021 15:45     CT ABDOMEN PELVIS WO CONTRAST  Result Date: 04/17/2021 CLINICAL DATA:  Nausea and vomiting EXAM: CT ABDOMEN AND PELVIS WITHOUT CONTRAST TECHNIQUE: Multidetector CT imaging of the abdomen and pelvis was performed following the standard protocol without IV contrast. COMPARISON:  None. FINDINGS: LOWER CHEST: Normal. HEPATOBILIARY: There are multiple hypodense lesions within the liver, which are poorly characterized on this noncontrast study. The largest lesion is in the right hepatic lobe and measures 3.3 cm. There is a 3.0 cm lesion in the left hepatic lobe. The gallbladder is normal. PANCREAS: Normal pancreas. No ductal dilatation or peripancreatic fluid collection. SPLEEN: Normal. ADRENALS/URINARY TRACT: The adrenal glands are normal. Nonobstructing calculus in the interpolar left kidney measuring 3 mm. The urinary bladder is normal for degree of distention STOMACH/BOWEL: There is no hiatal hernia. Normal duodenal course and caliber. No small bowel dilatation or inflammation. No focal colonic abnormality. Normal appendix. VASCULAR/LYMPHATIC: There is calcific atherosclerosis of the abdominal aorta. No lymphadenopathy. REPRODUCTIVE: Partially calcified uterine fibroid. MUSCULOSKELETAL. No bony spinal canal stenosis or focal osseous abnormality. OTHER: None. IMPRESSION: 1. No acute abnormality of the abdomen or pelvis. 2. Multiple hypodense lesions within the liver, poorly characterized on this noncontrast study. These are concerning for metastatic disease. Abdominal MRI with and without contrast is recommended for further characterization. 3. Nonobstructing 3 mm left renal calculus. Aortic Atherosclerosis  (ICD10-I70.0). Electronically Signed   By: Ulyses Jarred M.D.   On: 04/17/2021 20:05   DG Chest 1 View  Result Date: 04/18/2021 CLINICAL DATA:  One month of loss of appetite worsening weakness EXAM: CHEST  1 VIEW COMPARISON:  May 07, 2010 FINDINGS: Left chest pacemaker with leads overlying the right atrium and right ventricle. Prior median sternotomy and aortic valve replacement. The heart  size and mediastinal contours are within normal limits. No focal consolidation. Blunting of the right hemidiaphragm, similar prior. No visible pleural effusion or pneumothorax. The visualized skeletal structures are unremarkable. IMPRESSION: No acute cardiopulmonary findings. Electronically Signed   By: Dahlia Bailiff MD   On: 04/18/2021 00:43   DG Thoracic Spine W/Swimmers  Result Date: 04/12/2021 CLINICAL DATA:  Chronic mid and lower thoracic spine pain. No known injury. EXAM: THORACIC SPINE - 3 VIEWS COMPARISON:  None. FINDINGS: No fracture or listhesis. No focal bony lesion. Mild multilevel anterior endplate spurring is noted. IMPRESSION: Mild degenerative disease.  Otherwise negative. Electronically Signed   By: Inge Rise M.D.   On: 04/12/2021 10:28   CT CHEST ABDOMEN PELVIS W CONTRAST  Result Date: 04/18/2021 CLINICAL DATA:  Metastatic liver lesions on recent abdominal/pelvic CT scan. EXAM: CT CHEST, ABDOMEN, AND PELVIS WITH CONTRAST TECHNIQUE: Multidetector CT imaging of the chest, abdomen and pelvis was performed following the standard protocol during bolus administration of intravenous contrast. CONTRAST:  61m OMNIPAQUE IOHEXOL 300 MG/ML  SOLN COMPARISON:  Noncontrast abdominal/pelvic CT 04/17/2021 FINDINGS: CT CHEST FINDINGS Cardiovascular: The heart is normal in size. No pericardial effusion. There is tortuosity, ectasia and calcification of the thoracic aorta. Evidence of prior aortic valve replacement surgery. No dissection. The branch vessels are patent. Three-vessel coronary artery calcifications  are noted. Pacer wires are in good position without complicating features. Mediastinum/Nodes: No mediastinal or hilar mass or lymphadenopathy. The esophagus is unremarkable. The thyroid gland is unremarkable. Lungs/Pleura: Emphysematous changes are noted along with areas of pulmonary scarring. There are small cavitary nodules noted bilaterally. 3.5 mm left upper lobe nodule on image 34/5. 3 mm right upper lobe pulmonary nodule on image number 45/5. 8 mm nodule in the left upper lobe image 76/5. 4.5 mm nodule in the right upper lobe on image number 44/. Moderate nodularity along the right minor and right major fissures likely benign lymph nodes. No pleural effusions or pleural nodules. Musculoskeletal: The bony thorax is intact. No supraclavicular or axillary adenopathy. CT ABDOMEN PELVIS FINDINGS Hepatobiliary: As demonstrated on yesterday's noncontrast CT scan there are several hepatic lesions suspicious for metastatic disease. There is a infiltrating lesion in the left hepatic lobe with very ill-defined margins and probable associated biliary dilatation. There is also associated capsular retraction. This lesion measures approximately 9.5 x 8.4 x 4.0 cm. 3.8 x 3.5 cm lesion in the medial aspect of segment 2 on image 66/3. Segment 6 lesion on image 54/3 measures 3.9 x 3.3 cm. Lower segment 6 lesion on image 66/3 measures 2.8 x 2.6 cm. The gallbladder is contracted.  No common bile duct dilatation. Pancreas: There is a 3 cm low-attenuation lesion in the pancreatic body/tail junction region most likely representing the patient's primary neoplasm although it is possible this could be metastatic I think that is less likely. The head and body regions are unremarkable. No definite involvement of the posterior wall the stomach with the spleen. Spleen: Normal size. No focal lesions. The splenic vein is occluded. Adrenals/Urinary Tract: Adrenal glands and kidneys are unremarkable. No renal lesions or hydronephrosis. The  bladder is unremarkable. Stomach/Bowel: The stomach, duodenum, small bowel and colon are unremarkable. No acute inflammatory changes, mass lesions or obstructive findings. Vascular/Lymphatic: Advanced vascular calcifications but no aneurysm or dissection. The branch vessels are patent. The major venous structures are patent except for the splenic vein. No enlarged peripancreatic, gastrohepatic or retroperitoneal lymphadenopathy. Reproductive: The uterus and ovaries are unremarkable. Other: No pelvic adenopathy or inguinal adenopathy.  No significant free pelvic fluid collections. Musculoskeletal: No significant bony findings. No findings suspicious for osseous metastatic disease. IMPRESSION: 1. 3 cm low-attenuation lesion in the pancreatic body/tail junction region most likely representing the patient's primary neoplasm. 2. Extensive hepatic metastatic disease. 3. Small cavitary pulmonary nodules consistent with metastatic disease. 4. Splenic vein occlusion. 5. No mediastinal or hilar mass or adenopathy. 6. Advanced atherosclerotic calcifications involving the thoracic and abdominal aorta and branch vessels including the coronary arteries. 7. Emphysematous changes and pulmonary scarring. Aortic Atherosclerosis (ICD10-I70.0) and Emphysema (ICD10-J43.9). Electronically Signed   By: Marijo Sanes M.D.   On: 04/18/2021 15:45   Assessment and Plan:  This is a 75 year old female with  1) pancreatic mass, liver lesions, lung nodules concerning for metastatic pancreatic cancer -CT chest/abdomen/pelvis with contrast 04/18/2021 showed "1. 3 cm low-attenuation lesion in the pancreatic body/tail junction region most likely representing the patient's primary neoplasm. 2. Extensive hepatic metastatic disease. 3. Small cavitary pulmonary nodules consistent with metastatic Disease. 4. Splenic vein occlusion. 5. No mediastinal or hilar mass or adenopathy. 6. Advanced atherosclerotic calcifications involving the thoracic and  abdominal aorta and branch vessels including the coronary arteries. 7. 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  We discussed that the findings are concerning for metastatic pancreatic cancer. -We discussed that normally we would proceed with a biopsy to confirm the diagnosis.  However, given her elevated INR and the fact that she needs to be maintained on Coumadin due to her mechanical aortic valve, we can pursue a biopsy. -We will follow-up on AFP which is currently pending.  A CA 19.9 and CEA were ordered for earlier today which she refused.  I have reentered these orders to be drawn tomorrow morning with her other a.m. labs. -We discussed that metastatic pancreatic cancer is incurable.  Additionally, given her poor nutritional status and functional status, she is not a good candidate for chemotherapy.  I discussed making referral to the palliative care team to assist with goals of care discussion.  They are agreeable to meeting with the palliative care team. -Continue oral vitamin K to slowly bring down her INR level.  Thank you for this referral.   Mikey Bussing, DNP, AGPCNP-BC, AOCNP   ADDENDUM  .Patient was Personally and independently interviewed, examined and relevant elements of the history of present illness were reviewed in details and an assessment and plan was created. All elements of the patient's history of present illness , assessment and plan were discussed in details with Mikey Bussing, DNP, AGPCNP-BC, AOCNP. The above documentation reflects our combined findings assessment and plan.  Discussed imaging studies and concern for metastatic malignancy with pancreatic mass, liver lesions and pulmonary metastases .  Likely metastatic pancreatic cancer. CA 19-9, AFP and CEA are currently pending.  Patient is unable to get needle biopsy due to supratherapeutic INR and inability to completely reverse anticoag due to artificial heart valve.  Discussed goals of care in details to determine if the patient would like to pursue a complicated biopsy considering that treatment options will likely be palliative and in the case that she has confirmed pancreatic cancer might not be very effective. Also her current performance status ECOG PS 2-3 is quite limiting when it comes to benefits from palliative chemotherapy. Patient suggests  she is not really keen on chemotherapy but would like to have some time to think about it. Palliative care is to meet the patient for additional goals of care discussion.  Patients sons present for bedside discussion. We shall setup outpatient oncology f/u to continue goals of care decision.  Sullivan Lone MD MS

## 2021-04-19 NOTE — Plan of Care (Signed)
  Problem: Clinical Measurements: Goal: Cardiovascular complication will be avoided Outcome: Progressing   Problem: Activity: Goal: Risk for activity intolerance will decrease Outcome: Progressing   Problem: Nutrition: Goal: Adequate nutrition will be maintained Outcome: Progressing   Problem: Pain Managment: Goal: General experience of comfort will improve Outcome: Progressing   Problem: Safety: Goal: Ability to remain free from injury will improve Outcome: Progressing   Problem: Skin Integrity: Goal: Risk for impaired skin integrity will decrease Outcome: Progressing

## 2021-04-19 NOTE — Progress Notes (Signed)
Physical Therapy Treatment Patient Details Name: [name] [name] MRN: [id number] DOB: 03-17-1946 Today's Date: 04/19/2021    History of Present Illness Pt is a 75 y/o female admitted 5/17 seondary to generalized weakness, loss of appetite, and unintentional weight loss.  Found to have supratherapeutic INR and imaging revealed liver lesions. PMH includes s/p AVR, s/p pacemaker, HTN, and tobacco use.    PT Comments    Pt seen with son for therapy. Pt upset following conversation with MD regarding possible diagnosis. Pt agreeable to therapy to get out of the room. Pt with improved gait endruance/tolerance but continues to require HHA due to deficits in balance. Pt will benefit from skilled PT to address deficits to maximize independence with functional mobility prior to discharge.    Follow Up Recommendations  Home health PT;Supervision for mobility/OOB     Equipment Recommendations  Other (comment) (TBD pending progress)    Recommendations for Other Services       Precautions / Restrictions Precautions Precautions: Fall Restrictions Weight Bearing Restrictions: No    Mobility  Bed Mobility Overal bed mobility: Needs Assistance Bed Mobility: Supine to Sit     Supine to sit: Supervision;HOB elevated          Transfers Overall transfer level: Needs assistance Equipment used: 1 person hand held assist Transfers: Sit to/from Stand Sit to Stand: Min guard         General transfer comment: Min guard for safety. perfromed mulitple times with increased posterior lean  Ambulation/Gait Ambulation/Gait assistance: Min guard Gait Distance (Feet): 200 Feet Assistive device: 1 person hand held assist       General Gait Details: min guard for balance, decreased velocity,d ecreased step length B. Pt reaching out for objects in room to hold on to during ambulation wtih obstacles   Stairs             Wheelchair Mobility    Modified Rankin (Stroke Patients Only)        Balance           Standing balance support: Single extremity supported Standing balance-Leahy Scale: Fair                              Cognition                                              Exercises      General Comments General comments (skin integrity, edema, etc.): Son present during session      Pertinent Vitals/Pain Pain Assessment:  (no pain. pt states soreness in hips from sitting)    Home Living                      Prior Function            PT Goals (current goals can now be found in the care plan section) Acute Rehab PT Goals Patient Stated Goal: to be independent PT Goal Formulation: With patient/family Time For Goal Achievement: 05/02/21 Potential to Achieve Goals: Good Progress towards PT goals: Progressing toward goals    Frequency    Min 3X/week      PT Plan Current plan remains appropriate    Co-evaluation              AM-PAC PT "6 Clicks" Mobility  Outcome Measure  Help needed turning from your back to your side while in a flat bed without using bedrails?: None Help needed moving from lying on your back to sitting on the side of a flat bed without using bedrails?: A Little Help needed moving to and from a bed to a chair (including a wheelchair)?: A Little Help needed standing up from a chair using your arms (e.g., wheelchair or bedside chair)?: A Little Help needed to walk in hospital room?: A Little Help needed climbing 3-5 steps with a railing? : A Little 6 Click Score: 19    End of Session Equipment Utilized During Treatment: Gait belt Activity Tolerance: Patient tolerated treatment well Patient left: in chair;with chair alarm set;with family/visitor present;with call bell/phone within reach Nurse Communication: Mobility status PT Visit Diagnosis: Unsteadiness on feet (R26.81);Other abnormalities of gait and mobility (R26.89)     Time: 9935-7017 PT Time Calculation (min)  (ACUTE ONLY): 14 min  Charges:  $Gait Training: 8-22 mins                     Lyanne Co, DPT Acute Rehabilitation Services [phone]   [place] 04/19/2021, 1:10 PM

## 2021-04-20 DIAGNOSIS — K8689 Other specified diseases of pancreas: Secondary | ICD-10-CM

## 2021-04-20 DIAGNOSIS — R791 Abnormal coagulation profile: Secondary | ICD-10-CM

## 2021-04-20 DIAGNOSIS — E43 Unspecified severe protein-calorie malnutrition: Secondary | ICD-10-CM | POA: Insufficient documentation

## 2021-04-20 DIAGNOSIS — Z515 Encounter for palliative care: Secondary | ICD-10-CM

## 2021-04-20 DIAGNOSIS — C7989 Secondary malignant neoplasm of other specified sites: Secondary | ICD-10-CM

## 2021-04-20 DIAGNOSIS — C787 Secondary malignant neoplasm of liver and intrahepatic bile duct: Secondary | ICD-10-CM

## 2021-04-20 LAB — COMPREHENSIVE METABOLIC PANEL
ALT: 32 U/L (ref 0–44)
AST: 51 U/L — ABNORMAL HIGH (ref 15–41)
Albumin: 2.4 g/dL — ABNORMAL LOW (ref 3.5–5.0)
Alkaline Phosphatase: 156 U/L — ABNORMAL HIGH (ref 38–126)
Anion gap: 7 (ref 5–15)
BUN: 14 mg/dL (ref 8–23)
CO2: 25 mmol/L (ref 22–32)
Calcium: 8.3 mg/dL — ABNORMAL LOW (ref 8.9–10.3)
Chloride: 96 mmol/L — ABNORMAL LOW (ref 98–111)
Creatinine, Ser: 0.89 mg/dL (ref 0.44–1.00)
GFR, Estimated: >60 mL/min (ref >60)
Glucose, Bld: 119 mg/dL — ABNORMAL HIGH (ref 70–99)
Potassium: 4.3 mmol/L (ref 3.5–5.1)
Sodium: 128 mmol/L — ABNORMAL LOW (ref 135–145)
Total Bilirubin: 1 mg/dL (ref 0.3–1.2)
Total Protein: 5.3 g/dL — ABNORMAL LOW (ref 6.5–8.1)

## 2021-04-20 LAB — CBC
HCT: 29.6 % — ABNORMAL LOW (ref 36.0–46.0)
Hemoglobin: 9.7 g/dL — ABNORMAL LOW (ref 12.0–15.0)
MCH: 27.7 pg (ref 26.0–34.0)
MCHC: 32.8 g/dL (ref 30.0–36.0)
MCV: 84.6 fL (ref 80.0–100.0)
Platelets: 242 10*3/uL (ref 150–400)
RBC: 3.5 MIL/uL — ABNORMAL LOW (ref 3.87–5.11)
RDW: 14.6 % (ref 11.5–15.5)
WBC: 12.4 10*3/uL — ABNORMAL HIGH (ref 4.0–10.5)
nRBC: 0 % (ref 0.0–0.2)

## 2021-04-20 LAB — PROTIME-INR
INR: 4.8 (ref 0.8–1.2)
Prothrombin Time: 44.6 seconds — ABNORMAL HIGH (ref 11.4–15.2)

## 2021-04-20 LAB — AFP TUMOR MARKER: AFP, Serum, Tumor Marker: 11.8 ng/mL — ABNORMAL HIGH (ref 0.0–9.2)

## 2021-04-20 MED ORDER — SENNOSIDES-DOCUSATE SODIUM 8.6-50 MG PO TABS: 2.0000 | ORAL_TABLET | Freq: Every day | ORAL | Status: DC

## 2021-04-20 MED ORDER — POLYETHYLENE GLYCOL 3350 17 G PO PACK
17.0000 g | PACK | Freq: Every day | ORAL | Status: DC
  Administered 2021-04-20: 17 g via ORAL
  Filled 2021-04-20: qty 1

## 2021-04-20 MED ORDER — HYDROMORPHONE HCL 2 MG PO TABS: 1.0000 mg | ORAL_TABLET | ORAL | Status: DC | PRN

## 2021-04-20 MED ORDER — LORAZEPAM 0.5 MG PO TABS: 0.2500 mg | ORAL_TABLET | Freq: Four times a day (QID) | ORAL | Status: DC | PRN

## 2021-04-20 MED ORDER — ADULT MULTIVITAMIN W/MINERALS CH
1.0000 | ORAL_TABLET | Freq: Every day | ORAL | Status: DC
  Administered 2021-04-20: 1 via ORAL
  Filled 2021-04-20: qty 1

## 2021-04-20 MED ORDER — HYDROMORPHONE HCL 1 MG/ML IJ SOLN: 0.5000 mg | INTRAMUSCULAR | Status: DC | PRN

## 2021-04-20 MED ORDER — SENNOSIDES-DOCUSATE SODIUM 8.6-50 MG PO TABS: 1.0000 | ORAL_TABLET | Freq: Two times a day (BID) | ORAL | Status: DC

## 2021-04-20 MED ORDER — MAGNESIUM CITRATE PO SOLN: 0.5000 | Freq: Once | ORAL | Status: DC

## 2021-04-20 NOTE — Consult Note (Signed)
Consultation Note Date: 04/20/2021   Patient Name: [name] [name]  DOB: 03-01-1946  MRN: [id number]  Age / Sex: 75 y.o., female  PCP: Leeroy Cha, MD Referring Physician: Little Ishikawa, MD  Reason for Consultation: Establishing goals of care  HPI/Patient Profile: 75 y.o. female  with past medical history of aortic valve replacement on chronic coumadin, complete heart block s/p ICD placement, AAA s/p repair who was admitted on 04/17/2021 with weight loss and weakness as well as an elevated INR. She was found to have metastatic cancer on CT imaging that is presumed to be pancreatic primary with metastasis to the liver and lung.  PMT was asked to consult for [place].  Clinical Assessment and Goals of Care: I have reviewed medical records including EPIC notes, labs and imaging, received report from RN, assessed the patient and then spoke on the phone with her daughter Sharyn Lull to discuss diagnosis prognosis, [place], EOL wishes, disposition and options.  I introduced Palliative Medicine as specialized medical care for people living with serious illness. It focuses on providing relief from the symptoms and stress of a serious illness. The goal is to improve quality of life for both the patient and the family.  Mrs. Nell was trying to get out of bed to brush her teeth but needed assistance to do so.  She seemed somewhat overwhelmed with all of the information she has been given and told me that she "doesn't want to think about it".  "I just want to go home".   I asked if there was a family member that she relied on to help her make medical decisions and she responded that her paperwork indicates it is her daughter Sharyn Lull.  I asked if she would like to have a family meeting and she was amenable to doing so.  I then called her daughter Sharyn Lull.  Sharyn Lull explained that Mrs. Gatton lives at home with her two  sons.  She has been declining since last October when she felt bad for the entire month.  She has lost a lot of weight since then dropping from 126 to 108.  Sharyn Lull comments that her mother is excellent about wellness visits and will adhere to doctor's instructions, but she doesn't like to go to the doctor when she is sick.  She is rather stubborn about not wanting to be a burden to ANYONE and there for will not ask for help or indicate that she needs any help.  Sharyn Lull and I made a plan for a family meeting tomorrow at 2:00 pm - to include 6 family members and Mrs. Geesey.    Primary Decision Maker:  PATIENT  Decision making surrogate if needed is Sharyn Lull, daughter.    SUMMARY OF RECOMMENDATIONS     Family meeting tomorrow at 2:00 pm - to include 6 family members and Mrs. Saylor.   Code Status/Advance Care Planning:  Full.   Symptom Management:   Daughter indicated the patient had been having significant abdominal pain and trouble with constipation.  Added miralax and  senna for constipation  Added low dose ativan PRN anxiety or insomnia  Added low dose dilaudid PRN pain.  Additional Recommendations (Limitations, Scope, Preferences):  Full Scope Treatment  Palliative Prophylaxis:   Frequent Pain Assessment  Psycho-social/Spiritual:   Desire for further Chaplaincy support: welcomed.  Prognosis:   Very concerning.  With out treatment patient probably has less than 6 months.    Discharge Planning: To Be Determined      Primary Diagnoses: Present on Admission: . Supratherapeutic INR . Liver lesion . Complete heart block ([place]) . Paroxysmal atrial tachycardia ([place]) . Hyponatremia   I have reviewed the medical record, interviewed the patient and family, and examined the patient. The following aspects are pertinent.  Past Medical History:  Diagnosis Date  . Aortic stenosis 03/07/2010   replaced mechanical valve [place] procedure\  . Atrial fibrillation  ([place])   . Coronary artery disease    Dr. Avon Gully  . Dyslipidemia   . Hypertension   . Intermittent complete heart block ([place])    H/O  . Presence of permanent cardiac pacemaker   . S/P AAA repair 03/07/2010  . Systemic hypertension    Social History   Socioeconomic History  . Marital status: Widowed    Spouse name: Not on file  . Number of children: Not on file  . Years of education: Not on file  . Highest education level: Not on file  Occupational History  . Not on file  Tobacco Use  . Smoking status: Former Smoker    Quit date: 12/02/2009    Years since quitting: 11.3  . Smokeless tobacco: Never Used  Substance and Sexual Activity  . Alcohol use: No  . Drug use: No  . Sexual activity: Never  Other Topics Concern  . Not on file  Social History Narrative  . Not on file   Social Determinants of Health   Financial Resource Strain: Not on file  Food Insecurity: Not on file  Transportation Needs: Not on file  Physical Activity: Not on file  Stress: Not on file  Social Connections: Not on file   Family History  Problem Relation Age of Onset  . Diabetes Mother   . Hypertension Mother   . Heart failure Brother   . Hypertension Brother   . Diabetes Brother     Allergies  Allergen Reactions  . Tizanidine Hcl     Other reaction(s): Hallucinations, nausea  . Crestor [Rosuvastatin Calcium] Other (See Comments)    Stomach cramps  . Latex Hives and Rash  . Penicillins Other (See Comments)    Has patient had a PCN reaction causing immediate rash, facial/tongue/throat swelling, SOB or lightheadedness with hypotension: unknown Has patient had a PCN reaction causing severe rash involving mucus membranes or skin necrosis: unknown Has patient had a PCN reaction that required hospitalization already in the hospital when it happened Has patient had a PCN reaction occurring within the last 10 years: No If all of the above answers are "NO", then may proceed with Cephalosporin  use.       Vital Signs: BP 121/65 (BP Location: Right Arm)   Pulse 85   Temp 97.6 F (36.4 C) (Oral)   Resp 16   Ht 5\' 6"  (1.676 m)   Wt 48.1 kg   SpO2 98%   BMI 17.11 kg/m  Pain Scale: 0-10 POSS *See Group Information*: S-Acceptable,Sleep, easy to arouse Pain Score: 6    SpO2: SpO2: 98 % O2 Device:SpO2: 98 % O2 Flow Rate: .  Palliative Assessment/Data: 40%     Time In: 10:00 Time Out: 11:00 Time Total: 60 min. Visit consisted of counseling and education dealing with the complex and emotionally intense issues surrounding the need for palliative care and symptom management in the setting of serious and potentially life-threatening illness. Greater than 50%  of this time was spent counseling and coordinating care related to the above assessment and plan.  Signed by: [name], PA-C Palliative Medicine  Please contact Palliative Medicine Team phone at [phone] for questions and concerns.  For individual provider: See Shea Evans

## 2021-04-20 NOTE — Discharge Summary (Signed)
Physician Discharge Summary  [name] [name] [facility] [id number] DOB: Aug 16, 1946 DOA: 04/17/2021  PCP: Leeroy Cha, MD  Admit date: 04/17/2021 Discharge date: 04/20/2021  Admitted From: Home Disposition: Home  Recommendations for Outpatient Follow-up:  1. Follow up with PCP on Monday, 04/23/2021 for INR check 2. Please obtain BMP/CBC in one week 3. Please follow up with oncology and palliative care as scheduled  Home Health: None Equipment/Devices: None  Discharge Condition: Guarded CODE STATUS: Full Diet recommendation: Regular diet as tolerated  Brief/Interim Summary: Quintoria Yeater Medleyis a 75 y.o.femalewith medical history significant forBentall repair of small ascending aortic aneurysm with mechanical aortic valve replacement, heart block with pacer, rare paroxysmal atrial tachycardia not requiring treatment, hypertension, and remote tobacco abuse, now presenting to emergency department for evaluation of generalized weakness, loss of appetite, and unintentional weight loss over the past few months with more impressive weakness over the past week per family at bedside.  Initial symptoms began around January of this year with some 20 to 30 pound weight loss.  Patient's INR was incidentally noted to be greater than 10.  CT abdomen without contrast also shows questionable lesion x2 in the liver.  Oncology consulted requesting further imaging for possible staging while admitted for supratherapeutic INR.  Patient admitted as above with what appears to be failure to thrive with weight loss over the past 4 months and 20 to 30 pound range with notably elevated INR at intake while on Coumadin for mechanical aortic valve.  Unfortunately routine imaging in the ED at intake showed two 3 cm lesions in the liver, repeat imaging per oncology with contrast shows multiple lesions of the lungs abdomen liver as well as pancreas which is the presumed primary at this point for an unknown metastatic  disease.  Patient remains markedly high risk for biopsy given her elevated INR, unfortunately due to her mechanical aortic valve coming off anticoagulation also poses quite high risk. Patient is now much more awake alert oriented tolerating p.o. well with no further complaints or issues.  Her INR is downtrending appropriately to 4.8.  Lengthy discussion with care team about patient's poor prognosis, patient's main goal is to return home today to continue to spend time with her family with further outpatient follow-up for oncology markers and further discussion about possible treatment options which appear to be markedly limited in the setting of presumed metastatic pancreatic cancer.  We recommend the patient hold her Coumadin in the setting of supratherapeutic INR for repeat INR on Monday the 23rd with PCP(or whichever lab is most accessible to her) with further titration of Coumadin per their expertise.  Outpatient follow-up with palliative care and oncology as scheduled.  Discharge Diagnoses:  Principal Problem:   Supratherapeutic INR Active Problems:   H/O aortic valve replacement, 23 mm Carbomedics   Complete heart block (HCC)   Paroxysmal atrial tachycardia (HCC)   Liver lesion   Hyponatremia   Protein-calorie malnutrition, severe    Discharge Instructions  Discharge Instructions    Diet general   Complete by: As directed    Discharge instructions   Complete by: As directed    Do not restart Coumadin until INR has been checked on Monday, 04/23/2021   Increase activity slowly   Complete by: As directed      Allergies as of 04/20/2021      Reactions   Tizanidine Hcl    Other reaction(s): Hallucinations, nausea   Crestor [rosuvastatin Calcium] Other (See Comments)   Stomach cramps   Latex Hives, Rash  Penicillins Other (See Comments)   Has patient had a PCN reaction causing immediate rash, facial/tongue/throat swelling, SOB or lightheadedness with hypotension: unknown Has  patient had a PCN reaction causing severe rash involving mucus membranes or skin necrosis: unknown Has patient had a PCN reaction that required hospitalization already in the hospital when it happened Has patient had a PCN reaction occurring within the last 10 years: No If all of the above answers are "NO", then may proceed with Cephalosporin use.      Medication List    STOP taking these medications   clindamycin 150 MG capsule Commonly known as: CLEOCIN     TAKE these medications   acetaminophen 325 MG tablet Commonly known as: TYLENOL Take 325 mg by mouth every 6 (six) hours as needed for moderate pain or headache.   atorvastatin 40 MG tablet Commonly known as: LIPITOR Take 1 tablet (40 mg total) by mouth daily.   CALCARB 600 PO Take 1 tablet by mouth daily.   lisinopril 20 MG tablet Commonly known as: ZESTRIL TAKE 1 TABLET EVERY DAY   metoprolol tartrate 50 MG tablet Commonly known as: LOPRESSOR TAKE 1 TABLET TWICE DAILY   mirtazapine 15 MG tablet Commonly known as: REMERON Take 15 mg by mouth at bedtime.   multivitamin with minerals Tabs tablet Take 1 tablet by mouth daily.   warfarin 2.5 MG tablet Commonly known as: COUMADIN Take as directed. If you are unsure how to take this medication, talk to your nurse or doctor. Original instructions: TAKE 1 TO 1 AND 1/2 TABLETS DAILY AS DIRECTED BY COUMADIN CLINIC What changed: See the new instructions.       Allergies  Allergen Reactions  . Tizanidine Hcl     Other reaction(s): Hallucinations, nausea  . Crestor [Rosuvastatin Calcium] Other (See Comments)    Stomach cramps  . Latex Hives and Rash  . Penicillins Other (See Comments)    Has patient had a PCN reaction causing immediate rash, facial/tongue/throat swelling, SOB or lightheadedness with hypotension: unknown Has patient had a PCN reaction causing severe rash involving mucus membranes or skin necrosis: unknown Has patient had a PCN reaction that required  hospitalization already in the hospital when it happened Has patient had a PCN reaction occurring within the last 10 years: No If all of the above answers are "NO", then may proceed with Cephalosporin use.     Consultations: Oncology, palliative care  Procedures/Studies: CT ABDOMEN PELVIS WO CONTRAST  Result Date: 04/17/2021 CLINICAL DATA:  Nausea and vomiting EXAM: CT ABDOMEN AND PELVIS WITHOUT CONTRAST TECHNIQUE: Multidetector CT imaging of the abdomen and pelvis was performed following the standard protocol without IV contrast. COMPARISON:  None. FINDINGS: LOWER CHEST: Normal. HEPATOBILIARY: There are multiple hypodense lesions within the liver, which are poorly characterized on this noncontrast study. The largest lesion is in the right hepatic lobe and measures 3.3 cm. There is a 3.0 cm lesion in the left hepatic lobe. The gallbladder is normal. PANCREAS: Normal pancreas. No ductal dilatation or peripancreatic fluid collection. SPLEEN: Normal. ADRENALS/URINARY TRACT: The adrenal glands are normal. Nonobstructing calculus in the interpolar left kidney measuring 3 mm. The urinary bladder is normal for degree of distention STOMACH/BOWEL: There is no hiatal hernia. Normal duodenal course and caliber. No small bowel dilatation or inflammation. No focal colonic abnormality. Normal appendix. VASCULAR/LYMPHATIC: There is calcific atherosclerosis of the abdominal aorta. No lymphadenopathy. REPRODUCTIVE: Partially calcified uterine fibroid. MUSCULOSKELETAL. No bony spinal canal stenosis or focal osseous abnormality. OTHER: None. IMPRESSION: 1.  No acute abnormality of the abdomen or pelvis. 2. Multiple hypodense lesions within the liver, poorly characterized on this noncontrast study. These are concerning for metastatic disease. Abdominal MRI with and without contrast is recommended for further characterization. 3. Nonobstructing 3 mm left renal calculus. Aortic Atherosclerosis (ICD10-I70.0). Electronically  Signed   By: Ulyses Jarred M.D.   On: 04/17/2021 20:05   DG Chest 1 View  Result Date: 04/18/2021 CLINICAL DATA:  One month of loss of appetite worsening weakness EXAM: CHEST  1 VIEW COMPARISON:  May 07, 2010 FINDINGS: Left chest pacemaker with leads overlying the right atrium and right ventricle. Prior median sternotomy and aortic valve replacement. The heart size and mediastinal contours are within normal limits. No focal consolidation. Blunting of the right hemidiaphragm, similar prior. No visible pleural effusion or pneumothorax. The visualized skeletal structures are unremarkable. IMPRESSION: No acute cardiopulmonary findings. Electronically Signed   By: Dahlia Bailiff MD   On: 04/18/2021 00:43   DG Thoracic Spine W/Swimmers  Result Date: 04/12/2021 CLINICAL DATA:  Chronic mid and lower thoracic spine pain. No known injury. EXAM: THORACIC SPINE - 3 VIEWS COMPARISON:  None. FINDINGS: No fracture or listhesis. No focal bony lesion. Mild multilevel anterior endplate spurring is noted. IMPRESSION: Mild degenerative disease.  Otherwise negative. Electronically Signed   By: Inge Rise M.D.   On: 04/12/2021 10:28   CT CHEST ABDOMEN PELVIS W CONTRAST  Result Date: 04/18/2021 CLINICAL DATA:  Metastatic liver lesions on recent abdominal/pelvic CT scan. EXAM: CT CHEST, ABDOMEN, AND PELVIS WITH CONTRAST TECHNIQUE: Multidetector CT imaging of the chest, abdomen and pelvis was performed following the standard protocol during bolus administration of intravenous contrast. CONTRAST:  34mL OMNIPAQUE IOHEXOL 300 MG/ML  SOLN COMPARISON:  Noncontrast abdominal/pelvic CT 04/17/2021 FINDINGS: CT CHEST FINDINGS Cardiovascular: The heart is normal in size. No pericardial effusion. There is tortuosity, ectasia and calcification of the thoracic aorta. Evidence of prior aortic valve replacement surgery. No dissection. The branch vessels are patent. Three-vessel coronary artery calcifications are noted. Pacer wires are in  good position without complicating features. Mediastinum/Nodes: No mediastinal or hilar mass or lymphadenopathy. The esophagus is unremarkable. The thyroid gland is unremarkable. Lungs/Pleura: Emphysematous changes are noted along with areas of pulmonary scarring. There are small cavitary nodules noted bilaterally. 3.5 mm left upper lobe nodule on image 34/5. 3 mm right upper lobe pulmonary nodule on image number 45/5. 8 mm nodule in the left upper lobe image 76/5. 4.5 mm nodule in the right upper lobe on image number 44/. Moderate nodularity along the right minor and right major fissures likely benign lymph nodes. No pleural effusions or pleural nodules. Musculoskeletal: The bony thorax is intact. No supraclavicular or axillary adenopathy. CT ABDOMEN PELVIS FINDINGS Hepatobiliary: As demonstrated on yesterday's noncontrast CT scan there are several hepatic lesions suspicious for metastatic disease. There is a infiltrating lesion in the left hepatic lobe with very ill-defined margins and probable associated biliary dilatation. There is also associated capsular retraction. This lesion measures approximately 9.5 x 8.4 x 4.0 cm. 3.8 x 3.5 cm lesion in the medial aspect of segment 2 on image 66/3. Segment 6 lesion on image 54/3 measures 3.9 x 3.3 cm. Lower segment 6 lesion on image 66/3 measures 2.8 x 2.6 cm. The gallbladder is contracted.  No common bile duct dilatation. Pancreas: There is a 3 cm low-attenuation lesion in the pancreatic body/tail junction region most likely representing the patient's primary neoplasm although it is possible this could be metastatic I think that  is less likely. The head and body regions are unremarkable. No definite involvement of the posterior wall the stomach with the spleen. Spleen: Normal size. No focal lesions. The splenic vein is occluded. Adrenals/Urinary Tract: Adrenal glands and kidneys are unremarkable. No renal lesions or hydronephrosis. The bladder is unremarkable.  Stomach/Bowel: The stomach, duodenum, small bowel and colon are unremarkable. No acute inflammatory changes, mass lesions or obstructive findings. Vascular/Lymphatic: Advanced vascular calcifications but no aneurysm or dissection. The branch vessels are patent. The major venous structures are patent except for the splenic vein. No enlarged peripancreatic, gastrohepatic or retroperitoneal lymphadenopathy. Reproductive: The uterus and ovaries are unremarkable. Other: No pelvic adenopathy or inguinal adenopathy. No significant free pelvic fluid collections. Musculoskeletal: No significant bony findings. No findings suspicious for osseous metastatic disease. IMPRESSION: 1. 3 cm low-attenuation lesion in the pancreatic body/tail junction region most likely representing the patient's primary neoplasm. 2. Extensive hepatic metastatic disease. 3. Small cavitary pulmonary nodules consistent with metastatic disease. 4. Splenic vein occlusion. 5. No mediastinal or hilar mass or adenopathy. 6. Advanced atherosclerotic calcifications involving the thoracic and abdominal aorta and branch vessels including the coronary arteries. 7. Emphysematous changes and pulmonary scarring. Aortic Atherosclerosis (ICD10-I70.0) and Emphysema (ICD10-J43.9). Electronically Signed   By: Marijo Sanes M.D.   On: 04/18/2021 15:45      Subjective: No acute issues or events overnight denies nausea vomiting diarrhea constipation headache fevers or chills   Discharge Exam: Vitals:   04/20/21 0502 04/20/21 0854  BP: 105/60 121/65  Pulse: 92 85  Resp: 17 16  Temp: 98.5 F (36.9 C) 97.6 F (36.4 C)  SpO2: 95% 98%   Vitals:   04/19/21 1630 04/19/21 2112 04/20/21 0502 04/20/21 0854  BP: (!) 91/57 118/74 105/60 121/65  Pulse: 79 87 92 85  Resp: 18 18 17 16   Temp: 97.7 F (36.5 C) 98.4 F (36.9 C) 98.5 F (36.9 C) 97.6 F (36.4 C)  TempSrc: Oral Oral Oral Oral  SpO2: 97% 100% 95% 98%  Weight:  48.1 kg    Height:         General: Pt is alert, awake, not in acute distress somewhat cachectic in appearance Cardiovascular: RRR, S1/S2 +, no rubs, no gallops Respiratory: CTA bilaterally, no wheezing, no rhonchi Abdominal: Soft, NT, ND, bowel sounds + Extremities: no edema, no cyanosis    The results of significant diagnostics from this hospitalization (including imaging, microbiology, ancillary and laboratory) are listed below for reference.     Microbiology: Recent Results (from the past 240 hour(s))  SARS CORONAVIRUS 2 (TAT 6-24 HRS) Nasopharyngeal Nasopharyngeal Swab     Status: None   Collection Time: 04/18/21  1:45 AM   Specimen: Nasopharyngeal Swab  Result Value Ref Range Status   SARS Coronavirus 2 NEGATIVE NEGATIVE Final    Comment: (NOTE) SARS-CoV-2 target nucleic acids are NOT DETECTED.  The SARS-CoV-2 RNA is generally detectable in upper and lower respiratory specimens during the acute phase of infection. Negative results do not preclude SARS-CoV-2 infection, do not rule out co-infections with other pathogens, and should not be used as the sole basis for treatment or other patient management decisions. Negative results must be combined with clinical observations, patient history, and epidemiological information. The expected result is Negative.  Fact Sheet for Patients: SugarRoll.be  Fact Sheet for Healthcare Providers: https://www.woods-mathews.com/  This test is not yet approved or cleared by the [place] FDA and  has been authorized for detection and/or diagnosis of SARS-CoV-2 by FDA under an Emergency Use  Authorization (EUA). This EUA will remain  in effect (meaning this test can be used) for the duration of the COVID-19 declaration under Se ction 564(b)(1) of the Act, 21 U.S.C. section 360bbb-3(b)(1), unless the authorization is terminated or revoked sooner.  Performed at [facility], [place] [address] [place],  [place] [address]      Labs: BNP (last 3 results) No results for input(s): BNP in the last 8760 hours. Basic Metabolic Panel: Recent Labs  Lab 04/17/21 1900 04/18/21 0229 04/19/21 0242 04/20/21 0548  NA 129* 129* 132* 128*  K 4.4 4.0 4.6 4.3  CL 92* 93* 96* 96*  CO2 27 27 25 25   GLUCOSE 120* 145* 97 119*  BUN 15 15 13 14   CREATININE 0.80 0.80 0.69 0.89  CALCIUM 9.3 8.9 8.8* 8.3*   Liver Function Tests: Recent Labs  Lab 04/17/21 1900 04/18/21 0229 04/19/21 0242 04/20/21 0548  AST 40 31 37 51*  ALT 28 27 26  32  ALKPHOS 147* 133* 148* 156*  BILITOT 1.1 1.2 1.0 1.0  PROT 6.3* 5.7* 5.3* 5.3*  ALBUMIN 2.9* 2.6* 2.5* 2.4*   Recent Labs  Lab 04/17/21 1900  LIPASE 37   No results for input(s): AMMONIA in the last 168 hours. CBC: Recent Labs  Lab 04/17/21 1900 04/18/21 0229 04/19/21 0242 04/20/21 0548  WBC 15.4* 14.3* 13.2* 12.4*  NEUTROABS 12.8*  --   --   --   HGB 11.2* 10.7* 10.3* 9.7*  HCT 34.6* 32.8* 31.5* 29.6*  MCV 85.9 86.1 85.6 84.6  PLT 305 236 230 242   Cardiac Enzymes: No results for input(s): CKTOTAL, CKMB, CKMBINDEX, TROPONINI in the last 168 hours. BNP: Invalid input(s): POCBNP CBG: No results for input(s): GLUCAP in the last 168 hours. D-Dimer No results for input(s): DDIMER in the last 72 hours. Hgb A1c No results for input(s): HGBA1C in the last 72 hours. Lipid Profile No results for input(s): CHOL, HDL, LDLCALC, TRIG, CHOLHDL, LDLDIRECT in the last 72 hours. Thyroid function studies No results for input(s): TSH, T4TOTAL, T3FREE, THYROIDAB in the last 72 hours.  Invalid input(s): FREET3 Anemia work up No results for input(s): VITAMINB12, FOLATE, FERRITIN, TIBC, IRON, RETICCTPCT in the last 72 hours. Urinalysis    Component Value Date/Time   COLORURINE AMBER (A) 04/17/2021 1818   APPEARANCEUR HAZY (A) 04/17/2021 1818   LABSPEC 1.019 04/17/2021 1818   PHURINE 7.0 04/17/2021 1818   GLUCOSEU NEGATIVE 04/17/2021 1818   HGBUR NEGATIVE  04/17/2021 1818   BILIRUBINUR NEGATIVE 04/17/2021 1818   KETONESUR NEGATIVE 04/17/2021 1818   PROTEINUR 30 (A) 04/17/2021 1818   UROBILINOGEN 0.2 03/05/2010 1126   NITRITE NEGATIVE 04/17/2021 1818   LEUKOCYTESUR NEGATIVE 04/17/2021 1818   Sepsis Labs Invalid input(s): PROCALCITONIN,  WBC,  LACTICIDVEN Microbiology Recent Results (from the past 240 hour(s))  SARS CORONAVIRUS 2 (TAT 6-24 HRS) Nasopharyngeal Nasopharyngeal Swab     Status: None   Collection Time: 04/18/21  1:45 AM   Specimen: Nasopharyngeal Swab  Result Value Ref Range Status   SARS Coronavirus 2 NEGATIVE NEGATIVE Final    Comment: (NOTE) SARS-CoV-2 target nucleic acids are NOT DETECTED.  The SARS-CoV-2 RNA is generally detectable in upper and lower respiratory specimens during the acute phase of infection. Negative results do not preclude SARS-CoV-2 infection, do not rule out co-infections with other pathogens, and should not be used as the sole basis for treatment or other patient management decisions. Negative results must be combined with clinical observations, patient history, and epidemiological information.  The expected result is Negative.  Fact Sheet for Patients: SugarRoll.be  Fact Sheet for Healthcare Providers: https://www.woods-mathews.com/  This test is not yet approved or cleared by the [place] FDA and  has been authorized for detection and/or diagnosis of SARS-CoV-2 by FDA under an Emergency Use Authorization (EUA). This EUA will remain  in effect (meaning this test can be used) for the duration of the COVID-19 declaration under Se ction 564(b)(1) of the Act, 21 U.S.C. section 360bbb-3(b)(1), unless the authorization is terminated or revoked sooner.  Performed at [facility], [place] [address] [place], [place] [address]      Time coordinating discharge: Over 30 minutes  SIGNED:   Little Ishikawa, DO [facility] 04/20/2021,  4:13 PM Pager   If 7PM-7AM, please contact night-coverage [email or web address]

## 2021-04-20 NOTE — Plan of Care (Signed)
  Problem: Education: Goal: Knowledge of General Education information will improve Description Including pain rating scale, medication(s)/side effects and non-pharmacologic comfort measures Outcome: Progressing   

## 2021-04-20 NOTE — Progress Notes (Signed)
Initial Nutrition Assessment  DOCUMENTATION CODES:  Severe malnutrition in context of chronic illness  INTERVENTION:  -Recommend liberalizing pt's diet to regular -Continue Ensure Enlive po TID, each supplement provides 350 kcal and 20 grams of protein -Magic cup BID with meals, each supplement provides 290 kcal and 9 grams of protein -MVI with minerals daily  NUTRITION DIAGNOSIS:  Severe Malnutrition related to chronic illness (cancer) as evidenced by percent weight loss,energy intake < or equal to 75% for > or equal to 1 month,severe muscle depletion,severe fat depletion.  GOAL:  Patient will meet greater than or equal to 90% of their needs  MONITOR:  PO intake,Supplement acceptance,Weight trends,Labs,I & O's  REASON FOR ASSESSMENT:  Consult Assessment of nutrition requirement/status  ASSESSMENT:  Pt admitted with pancreatic mass, liver lesions, and lung nodules concerning for metastatic pancreatic cancer. PMH includes Bentall repair of small ascending aortic aneurysm with mechanical aortic valve replacement, heart block with pacemaker, paroxysmal atrial tachycardia, HTN, and remote h/o tobacco abuse.   Pt noted to be a poor candidate for systemic treatment per MD. PMT is following. Note plan for family meeting tomorrow at 2:00pm. Pt remains full scope treatment as of right now, though PMT notes pt with very poor prognosis and anticipated to have <6 months.   Pt reports poor appetite beginning in Jan 2022 with associated generalized weakness and 20 lb weight loss. Pt does endorse intermittent nausea but no vomiting. Also reports occasional abdominal pain. Note pt has been receiving Ensure TID and is doing well with supplements per RN.   PO Intake: 0-50% x 3 recorded meals (33% average intake)  Per weight readings, pt weighed 56 kg on 01/01/21 and now weighs 48.1 kg, indicating a clinically significant 14% weight loss x4 months   No UOP documented  Medications: remeron, miralax,  senokot-s Labs: 128(L)  NUTRITION - FOCUSED PHYSICAL EXAM: Flowsheet Row Most Recent Value  Orbital Region Severe depletion  Upper Arm Region Severe depletion  Thoracic and Lumbar Region Severe depletion  Buccal Region Severe depletion  Temple Region Severe depletion  Clavicle Bone Region Severe depletion  Clavicle and Acromion Bone Region Severe depletion  Scapular Bone Region Severe depletion  Dorsal Hand Severe depletion  Patellar Region Severe depletion  Anterior Thigh Region Severe depletion  Posterior Calf Region Severe depletion  Edema (RD Assessment) None  Hair Reviewed  Eyes Reviewed  Mouth Reviewed  Skin Reviewed  Nails Reviewed      Diet Order:   Diet Order            Diet Heart Room service appropriate? Yes; Fluid consistency: Thin  Diet effective now                EDUCATION NEEDS:  No education needs have been identified at this time  Skin:  Skin Assessment: Reviewed RN Assessment  Last BM:  PTA  Height:  Ht Readings from Last 1 Encounters:  04/17/21 5\' 6"  (1.676 m)   Weight:  Wt Readings from Last 1 Encounters:  04/19/21 48.1 kg   BMI:  Body mass index is 17.11 kg/m.  Estimated Nutritional Needs:  Kcal:  1450-1650 Protein:  70-85g Fluid:  >1.45L    Larkin Ina, MS, RD, LDN RD pager number and weekend/on-call pager number located in [place].

## 2021-04-20 NOTE — Progress Notes (Signed)
DISCHARGE NOTE HOME [name] [name] to be discharged Home per MD order. Discussed prescriptions and follow up appointments with the patient. Prescriptions given to patient; medication list explained in detail. Patient verbalized understanding.  Skin clean, dry and intact without evidence of skin break down, no evidence of skin tears noted. IV catheter discontinued intact. Site without signs and symptoms of complications. Dressing and pressure applied. Pt denies pain at the site currently. No complaints noted.  Patient free of lines, drains, and wounds.   An After Visit Summary (AVS) was printed and given to the patient. Patient escorted via wheelchair, and discharged home via private auto.  Arlyss Repress, RN

## 2021-04-21 DIAGNOSIS — K8689 Other specified diseases of pancreas: Secondary | ICD-10-CM

## 2021-04-21 DIAGNOSIS — C787 Secondary malignant neoplasm of liver and intrahepatic bile duct: Secondary | ICD-10-CM

## 2021-04-21 DIAGNOSIS — C799 Secondary malignant neoplasm of unspecified site: Secondary | ICD-10-CM

## 2021-04-21 LAB — CEA: CEA: 647 ng/mL — ABNORMAL HIGH (ref 0.0–4.7)

## 2021-04-21 LAB — CANCER ANTIGEN 19-9: CA 19-9: 45 U/mL — ABNORMAL HIGH (ref 0–35)

## 2021-04-21 NOTE — Progress Notes (Signed)
Family requested a Palliative Teleconference.   Was previously scheduled prior to discharge but patient was discharged rather quickly last evening.    I agreed to have the teleconference even though patient was DC'd from hospital.  Forde Dandy on the phone with patient's daughter Sharyn Lull, patient's brother and his wife, multiple other relatives from 2:00 pm - 2:48 pm.  We discussed the patient's goals of care.   She is used to being active and not having to depend upon others.  She has a very difficult time asking for help and does not want to be a burden.  It is important to her to have functionality and be mostly independent with ADLs.   If she is not able to maintain this level of independence and prognosis was short, she would prefer being comfortable at home with hospice services.  We discussed her hospital test results including her imaging and blood work.  She has tumor in her pancreas, liver and lungs. We talked about metastatic stage 4 cancer being incurable but possibly having symptoms improved or life extended with palliative chemotherapy if offered.  We also discussed the result of her AFP, CA-19-9, and CEA tumor markers.  All of them were high, but the CEA was proportionally much higher than the other two.   We questioned whether or not this could be metastatic colon cancer but there was no evidence of tumor on CT in the intestine and the patient had a clean colonoscopy in 2016 by Dr. Collene Mares.  If colonoscopy and biopsy was to be undertaken we would run in the the same issue with anticoagulation (Mechanical Aortic Valve).   I encouraged the patient and family to follow up with Oncology for further information and recommendations.    We discussed Palliative Care and Hospice services.  Until we have further information we will involve Palliative Care, but the patient understands that she is eligible for Hospice services at any time if she decides not to pursue oncologic therapy.  We discussed  symptoms.  The patient is not sleeping and is having difficulty with constipation.  I recommended miralax in 4-6 ounces of fluid up to two times a day in combination with a low dose stimulant laxative.   With regard to insomnia the patient say that when she can't sleep sometimes a sip of NightQuil will help.  I explained the antihistamine in Night Quil makes you sleepy and that would be as good a sleep aid as any right now.  We discussed nutrition.  Protein drinks with 20 - 30 grams of protein were recommended BID.   I sent [employer] of a couple of brands that I think taste good.     I committed to touch base with the [facility] to help ensure that a follow up appointment is arranged.   [name], PA-C Palliative Medicine Office:  [phone]  No charge note.

## 2021-04-23 ENCOUNTER — Other Ambulatory Visit: Payer: Self-pay

## 2021-04-23 ENCOUNTER — Ambulatory Visit (INDEPENDENT_AMBULATORY_CARE_PROVIDER_SITE_OTHER): Payer: Medicare HMO

## 2021-04-23 DIAGNOSIS — Z952 Presence of prosthetic heart valve: Secondary | ICD-10-CM | POA: Diagnosis not present

## 2021-04-23 DIAGNOSIS — Z7901 Long term (current) use of anticoagulants: Secondary | ICD-10-CM | POA: Diagnosis not present

## 2021-04-23 LAB — POCT INR: INR: 7.8 — AB (ref 2.0–3.0)

## 2021-04-23 NOTE — Progress Notes (Signed)
HEMATOLOGY/ONCOLOGY CONSULTATION NOTE  Date of Service: 04/24/2021  Patient Care Team: Leeroy Cha, MD as PCP - General (Internal Medicine) Croitoru, Dani Gobble, MD as PCP - Cardiology (Cardiology) Juanita Craver, MD as Consulting Physician (Gastroenterology)  CHIEF COMPLAINTS/PURPOSE OF CONSULTATION:  Metastatic Pancreatic Cancer  HISTORY OF PRESENTING ILLNESS:  I connected with Cline Crock on 04/24/2021 by telephone and verified that I am speaking with the correct person using two identifiers.   I discussed the limitations of evaluation and management by telemedicine. The patient expressed understanding and agreed to proceed.   Other persons participating in the visit and their role in the encounter:                                                         - Reinaldo Raddle, Medical Scribe            - Pt's Daughter               - Ray, Pt's Son-in-Law     Patient's location: Home Provider's location: [place] [facility] [employer] is a wonderful 75 y.o. female who has been seen by [place] in the hospital for evaluation and management of metastatic pancreatic cancer. The pt reports that she is doing well overall.  The pt has had CT C/A/P ([id number]) on 04/18/2021, which revealed "1. 3 cm low-attenuation lesion in the pancreatic body/tail junction region most likely representing the patient's primary neoplasm. 2. Extensive hepatic metastatic disease. 3. Small cavitary pulmonary nodules consistent with metastatic disease. 4. Splenic vein occlusion. 5. No mediastinal or hilar mass or adenopathy. 6. Advanced atherosclerotic calcifications involving the thoracic and abdominal aorta and branch vessels including the coronary arteries. 7. Emphysematous changes and pulmonary scarring"  Ms. 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  She presented to the emergency room for generalized weakness, loss of appetite, and weight loss.  She developed a poor appetite beginning in January 2022.  She has been progressively fatigued and had generalized weakness and reported 20 pound weight loss since this time.  Admission lab work significant for WBC 15.4, hemoglobin 11.2, sodium 129, albumin 2.9, alk phos 147.  Her PT was greater than 90 and INR was greater than 10.  She initially had a CT of the abdomen/pelvis without contrast which showed no acute abnormality but showed multiple hypodense lesions within the liver which are poorly characterized on a noncontrast study but lesions concerning for metastatic disease.  This was followed with a CT of the abdomen/pelvis with contrast which showed a 3 cm low-attenuation lesion in the pancreatic body/tail junction region most likely representing neoplasm, extensive hepatic metastatic disease, small cavitary pulmonary nodules consistent with metastatic disease, splenic vein occlusion.  Due to her elevated INR, she was given vitamin K 2.5 mg p.o. x1.  The pt reports that she has not been eating well recently since being discharged. She has been having issue with diarrhea lately after being constipated prior. The pt notes she was able to eat a piece of fruit this morning.   Labwork 04/20/2021 of CBC w diff and CMP shows all WNL except WBC  of 12.4K, RBC of 3.50, Hgb of 9.7, HCT of 29.6, Sodium of 128, Chloride of 96, Glucose of 119, Calcium of 8.3, Total protein of 5.3, Albumin of 2.4, AST of 51, Alkaline Phosphatase of 156. 04/20/2021 CEA of 647.0. 04/20/2021 CA 19-9 of 45.  On review of systems, pt reports decreased appetite, fatigue, diarrhea and denies any other symptoms.  MEDICAL HISTORY:  Past Medical History:  Diagnosis Date  . Aortic stenosis 03/07/2010   replaced mechanical valve Bentall procedure\  .  Atrial fibrillation ([place])   . Coronary artery disease    Dr. Avon Gully  . Dyslipidemia   . Hypertension   . Intermittent complete heart block ([place])    H/O  . Presence of permanent cardiac pacemaker   . S/P AAA repair 03/07/2010  . Systemic hypertension     SURGICAL HISTORY: Past Surgical History:  Procedure Laterality Date  . ABDOMINAL AORTIC ANEURYSM REPAIR  03/07/2010  . AORTIC VALVE REPLACEMENT  03/07/2010   mechanical valve Bentall procedure  . CARDIAC CATHETERIZATION  08/25/2009   severe AS, mild CAD  . COLONOSCOPY WITH PROPOFOL N/A 09/12/2015   Procedure: COLONOSCOPY WITH PROPOFOL;  Surgeon: Juanita Craver, MD;  Location: [facility] END[facility]COPY;  Service: Endoscopy;  Laterality: N/A;  . PACEMAKER INSERTION  03/13/2010   Medtronic Adapta  . US ECHOCARDIOGRAPHY  08/09/2011   mod. concentric LVH,LA mildly dilated,ca+ MV,trace TR and AI,mechanical AOV    SOCIAL HISTORY: Social History   Socioeconomic History  . Marital status: Widowed    Spouse name: Not on file  . Number of children: Not on file  . Years of education: Not on file  . Highest education level: Not on file  Occupational History  . Not on file  Tobacco Use  . Smoking status: Former Smoker    Quit date: 12/02/2009    Years since quitting: 11.4  . Smokeless tobacco: Never Used  Substance and Sexual Activity  . Alcohol use: No  . Drug use: No  . Sexual activity: Never  Other Topics Concern  . Not on file  Social History Narrative  . Not on file   Social Determinants of Health   Financial Resource Strain: Not on file  Food Insecurity: Not on file  Transportation Needs: Not on file  Physical Activity: Not on file  Stress: Not on file  Social Connections: Not on file  Intimate Partner Violence: Not on file    FAMILY HISTORY: Family History  Problem Relation Age of Onset  . Diabetes Mother   . Hypertension Mother   . Heart failure Brother   . Hypertension Brother   . Diabetes Brother     ALLERGIES:  is allergic  to tizanidine hcl, crestor [rosuvastatin calcium], latex, and penicillins.  MEDICATIONS:  Current Outpatient Medications  Medication Sig Dispense Refill  . acetaminophen (TYLENOL) 325 MG tablet Take 325 mg by mouth every 6 (six) hours as needed for moderate pain or headache.    Marland Kitchen atorvastatin (LIPITOR) 40 MG tablet Take 1 tablet (40 mg total) by mouth daily. 90 tablet 3  . Calcium Carbonate (CALCARB 600 PO) Take 1 tablet by mouth daily.    Marland Kitchen lisinopril (ZESTRIL) 20 MG tablet TAKE 1 TABLET EVERY DAY (Patient taking differently: Take 20 mg by mouth daily.) 90 tablet 3  . metoprolol tartrate (LOPRESSOR) 50 MG tablet TAKE 1 TABLET TWICE DAILY (Patient taking differently: Take 50 mg by mouth 2 (two) times daily.) 180 tablet 3  . mirtazapine (REMERON) 15 MG tablet Take 15 mg  by mouth at bedtime.    . Multiple Vitamin (MULITIVITAMIN WITH MINERALS) TABS Take 1 tablet by mouth daily.    Marland Kitchen warfarin (COUMADIN) 2.5 MG tablet TAKE 1 TO 1 AND 1/2 TABLETS DAILY AS DIRECTED BY COUMADIN CLINIC (Patient taking differently: Take 2.5 mg by mouth daily.) 135 tablet 1   No current facility-administered medications for this visit.    REVIEW OF SYSTEMS:    10 Point review of Systems was done is negative except as noted above.  PHYSICAL EXAMINATION: ECOG PERFORMANCE STATUS: 3 - Symptomatic, >50% confined to bed  .There were no vitals filed for this visit. There were no vitals filed for this visit. .There is no height or weight on file to calculate BMI.   Telehealth Visit  LABORATORY DATA:  I have reviewed the data as listed  . CBC Latest Ref Rng & Units 04/20/2021 04/19/2021 04/18/2021  WBC 4.0 - 10.5 K/uL 12.4(H) 13.2(H) 14.3(H)  Hemoglobin 12.0 - 15.0 g/dL 9.7(L) 10.3(L) 10.7(L)  Hematocrit 36.0 - 46.0 % 29.6(L) 31.5(L) 32.8(L)  Platelets 150 - 400 K/uL 242 230 236    . CMP Latest Ref Rng & Units 04/20/2021 04/19/2021 04/18/2021  Glucose 70 - 99 mg/dL 119(H) 97 145(H)  BUN 8 - 23 mg/dL 14 13 15    Creatinine 0.44 - 1.00 mg/dL 0.89 0.69 0.80  Sodium 135 - 145 mmol/L 128(L) 132(L) 129(L)  Potassium 3.5 - 5.1 mmol/L 4.3 4.6 4.0  Chloride 98 - 111 mmol/L 96(L) 96(L) 93(L)  CO2 22 - 32 mmol/L 25 25 27   Calcium 8.9 - 10.3 mg/dL 8.3(L) 8.8(L) 8.9  Total Protein 6.5 - 8.1 g/dL 5.3(L) 5.3(L) 5.7(L)  Total Bilirubin 0.3 - 1.2 mg/dL 1.0 1.0 1.2  Alkaline Phos 38 - 126 U/L 156(H) 148(H) 133(H)  AST 15 - 41 U/L 51(H) 37 31  ALT 0 - 44 U/L 32 26 27     RADIOGRAPHIC STUDIES: I have personally reviewed the radiological images as listed and agreed with the findings in the report. CT ABDOMEN PELVIS WO CONTRAST  Result Date: 04/17/2021 CLINICAL DATA:  Nausea and vomiting EXAM: CT ABDOMEN AND PELVIS WITHOUT CONTRAST TECHNIQUE: Multidetector CT imaging of the abdomen and pelvis was performed following the standard protocol without IV contrast. COMPARISON:  None. FINDINGS: LOWER CHEST: Normal. HEPATOBILIARY: There are multiple hypodense lesions within the liver, which are poorly characterized on this noncontrast study. The largest lesion is in the right hepatic lobe and measures 3.3 cm. There is a 3.0 cm lesion in the left hepatic lobe. The gallbladder is normal. PANCREAS: Normal pancreas. No ductal dilatation or peripancreatic fluid collection. SPLEEN: Normal. ADRENALS/URINARY TRACT: The adrenal glands are normal. Nonobstructing calculus in the interpolar left kidney measuring 3 mm. The urinary bladder is normal for degree of distention STOMACH/BOWEL: There is no hiatal hernia. Normal duodenal course and caliber. No small bowel dilatation or inflammation. No focal colonic abnormality. Normal appendix. VASCULAR/LYMPHATIC: There is calcific atherosclerosis of the abdominal aorta. No lymphadenopathy. REPRODUCTIVE: Partially calcified uterine fibroid. MUSCULOSKELETAL. No bony spinal canal stenosis or focal osseous abnormality. OTHER: None. IMPRESSION: 1. No acute abnormality of the abdomen or pelvis. 2. Multiple  hypodense lesions within the liver, poorly characterized on this noncontrast study. These are concerning for metastatic disease. Abdominal MRI with and without contrast is recommended for further characterization. 3. Nonobstructing 3 mm left renal calculus. Aortic Atherosclerosis (ICD10-I70.0). Electronically Signed   By: Ulyses Jarred M.D.   On: 04/17/2021 20:05   DG Chest 1 View  Result Date:  04/18/2021 CLINICAL DATA:  One month of loss of appetite worsening weakness EXAM: CHEST  1 VIEW COMPARISON:  May 07, 2010 FINDINGS: Left chest pacemaker with leads overlying the right atrium and right ventricle. Prior median sternotomy and aortic valve replacement. The heart size and mediastinal contours are within normal limits. No focal consolidation. Blunting of the right hemidiaphragm, similar prior. No visible pleural effusion or pneumothorax. The visualized skeletal structures are unremarkable. IMPRESSION: No acute cardiopulmonary findings. Electronically Signed   By: Dahlia Bailiff MD   On: 04/18/2021 00:43   DG Thoracic Spine W/Swimmers  Result Date: 04/12/2021 CLINICAL DATA:  Chronic mid and lower thoracic spine pain. No known injury. EXAM: THORACIC SPINE - 3 VIEWS COMPARISON:  None. FINDINGS: No fracture or listhesis. No focal bony lesion. Mild multilevel anterior endplate spurring is noted. IMPRESSION: Mild degenerative disease.  Otherwise negative. Electronically Signed   By: Inge Rise M.D.   On: 04/12/2021 10:28   CT CHEST ABDOMEN PELVIS W CONTRAST  Result Date: 04/18/2021 CLINICAL DATA:  Metastatic liver lesions on recent abdominal/pelvic CT scan. EXAM: CT CHEST, ABDOMEN, AND PELVIS WITH CONTRAST TECHNIQUE: Multidetector CT imaging of the chest, abdomen and pelvis was performed following the standard protocol during bolus administration of intravenous contrast. CONTRAST:  66m OMNIPAQUE IOHEXOL 300 MG/ML  SOLN COMPARISON:  Noncontrast abdominal/pelvic CT 04/17/2021 FINDINGS: CT CHEST FINDINGS  Cardiovascular: The heart is normal in size. No pericardial effusion. There is tortuosity, ectasia and calcification of the thoracic aorta. Evidence of prior aortic valve replacement surgery. No dissection. The branch vessels are patent. Three-vessel coronary artery calcifications are noted. Pacer wires are in good position without complicating features. Mediastinum/Nodes: No mediastinal or hilar mass or lymphadenopathy. The esophagus is unremarkable. The thyroid gland is unremarkable. Lungs/Pleura: Emphysematous changes are noted along with areas of pulmonary scarring. There are small cavitary nodules noted bilaterally. 3.5 mm left upper lobe nodule on image 34/5. 3 mm right upper lobe pulmonary nodule on image number 45/5. 8 mm nodule in the left upper lobe image 76/5. 4.5 mm nodule in the right upper lobe on image number 44/. Moderate nodularity along the right minor and right major fissures likely benign lymph nodes. No pleural effusions or pleural nodules. Musculoskeletal: The bony thorax is intact. No supraclavicular or axillary adenopathy. CT ABDOMEN PELVIS FINDINGS Hepatobiliary: As demonstrated on yesterday's noncontrast CT scan there are several hepatic lesions suspicious for metastatic disease. There is a infiltrating lesion in the left hepatic lobe with very ill-defined margins and probable associated biliary dilatation. There is also associated capsular retraction. This lesion measures approximately 9.5 x 8.4 x 4.0 cm. 3.8 x 3.5 cm lesion in the medial aspect of segment 2 on image 66/3. Segment 6 lesion on image 54/3 measures 3.9 x 3.3 cm. Lower segment 6 lesion on image 66/3 measures 2.8 x 2.6 cm. The gallbladder is contracted.  No common bile duct dilatation. Pancreas: There is a 3 cm low-attenuation lesion in the pancreatic body/tail junction region most likely representing the patient's primary neoplasm although it is possible this could be metastatic I think that is less likely. The head and body  regions are unremarkable. No definite involvement of the posterior wall the stomach with the spleen. Spleen: Normal size. No focal lesions. The splenic vein is occluded. Adrenals/Urinary Tract: Adrenal glands and kidneys are unremarkable. No renal lesions or hydronephrosis. The bladder is unremarkable. Stomach/Bowel: The stomach, duodenum, small bowel and colon are unremarkable. No acute inflammatory changes, mass lesions or obstructive findings. Vascular/Lymphatic: Advanced  vascular calcifications but no aneurysm or dissection. The branch vessels are patent. The major venous structures are patent except for the splenic vein. No enlarged peripancreatic, gastrohepatic or retroperitoneal lymphadenopathy. Reproductive: The uterus and ovaries are unremarkable. Other: No pelvic adenopathy or inguinal adenopathy. No significant free pelvic fluid collections. Musculoskeletal: No significant bony findings. No findings suspicious for osseous metastatic disease. IMPRESSION: 1. 3 cm low-attenuation lesion in the pancreatic body/tail junction region most likely representing the patient's primary neoplasm. 2. Extensive hepatic metastatic disease. 3. Small cavitary pulmonary nodules consistent with metastatic disease. 4. Splenic vein occlusion. 5. No mediastinal or hilar mass or adenopathy. 6. Advanced atherosclerotic calcifications involving the thoracic and abdominal aorta and branch vessels including the coronary arteries. 7. Emphysematous changes and pulmonary scarring. Aortic Atherosclerosis (ICD10-I70.0) and Emphysema (ICD10-J43.9). Electronically Signed   By: Marijo Sanes M.D.   On: 04/18/2021 15:45    ASSESSMENT & PLAN:   75 yo with   1) Metastatic pancreatic cancer with liver metastases and pulmonary mets.  PLAN: -Discussed need for biopsy and difficulty of this given the spot and pt's inability to get off blood thinners. -Discussed goals of care. Advised pt this is metastatic and stage 4, most likely from  pancreas. The treatments would not be curative but palliative chemotherapy. -Advised pt that she may not be nutritionally sustaining herself well enough to pursue chemotherapy and the burden of care associated with this. -Discussed functional status of pt. The first step would be to be admitted and get a biopsy. -Provided goals of care counseling. The pt does not desire to pursue chemotherapy and wishes to choose comfort care measures. -Will hold off on biopsy and any treatment. Recommended pt enroll in home hospice. The pt has an appt with [facility] today. -Will be able to send referral to Hospice if needed for insurance purposes.    FOLLOW UP: Pt will be enrolling with [facility]. Will provide referral if needed.   All of the patients questions were answered with apparent satisfaction. The patient knows to call the clinic with any problems, questions or concerns.  I spent 30 minutes counseling the patient face to face. The total time spent in the appointment was 40 minutes and more than 50% was on counseling and direct patient cares.    Sullivan Lone MD [place] AAHIVMS [facility] Hematology/Oncology Physician [facility]  (Office):       [phone] (Work cell):  [phone] (Fax):           [phone]  04/24/2021 11:57 AM  I, Reinaldo Raddle, am acting as scribe for Dr. Sullivan Lone, MD.  .I have reviewed the above documentation for accuracy and completeness, and I agree with the above. Brunetta Genera MD

## 2021-04-23 NOTE — Patient Instructions (Signed)
Hold tonight, Tuesday, Wednesday and Thursday and then continue 1 tablet daily. Repeat 5/27. Eat greens over the next 2 days.

## 2021-04-23 NOTE — Progress Notes (Signed)
Received a call from [name] [name], [place] for [employer].   [name] [name] had a few questions.  The first was regarding bowel movements.  Initially her mother was constipated and in pain.  Now after a couple of days of miralax she is having diarrhea.   We discussed stopping the miralax and giving her time vs bulking up the stool a bit with metamucil or benefiber vs if she is truly having multiple bouts of diarrhea - cautiously using low dose imodium.  We discussed the fact that we did not want to shift her back to a constipated state.    Other than that [name] [name] is doing fairly well at home.  I let [name] [name] know that the [name] will be reaching out to her to schedule an appointment.  The issue is that the patient would have to have a biopsy before any oncologic treatment can be prescribed.   This would require stopping her anticoagulation which is risky because of her mechanical heart valve.   [name] [name] understands that any chemotherapy would be palliative rather than curative, and may be very difficult for her mother given her ECOG status.  [name] [name] would like to talk to the Oncologist to gather their expert opinion.  She also asked how do we contact hospice?   I explained that she can self refer.  She asked for contact information for [facility] and [facility].  We discussed the excellent services both groups provide and the location of their end of life hospice houses.   I gave her telephone numbers for both groups.  [name], PA-C Palliative Medicine Office:  [phone]  No charge note.

## 2021-04-24 ENCOUNTER — Inpatient Hospital Stay: Payer: Medicare HMO | Attending: Hematology | Admitting: Hematology

## 2021-04-24 DIAGNOSIS — Z7189 Other specified counseling: Secondary | ICD-10-CM | POA: Diagnosis not present

## 2021-04-24 DIAGNOSIS — C259 Malignant neoplasm of pancreas, unspecified: Secondary | ICD-10-CM

## 2021-04-24 DIAGNOSIS — C787 Secondary malignant neoplasm of liver and intrahepatic bile duct: Secondary | ICD-10-CM

## 2021-04-25 ENCOUNTER — Ambulatory Visit (INDEPENDENT_AMBULATORY_CARE_PROVIDER_SITE_OTHER): Payer: MEDICARE

## 2021-04-25 DIAGNOSIS — I484 Atypical atrial flutter: Secondary | ICD-10-CM

## 2021-04-27 ENCOUNTER — Telehealth: Payer: Self-pay | Admitting: Cardiovascular Disease

## 2021-04-27 ENCOUNTER — Ambulatory Visit (INDEPENDENT_AMBULATORY_CARE_PROVIDER_SITE_OTHER): Payer: Medicare HMO | Admitting: Cardiovascular Disease

## 2021-04-27 ENCOUNTER — Other Ambulatory Visit: Payer: Self-pay | Admitting: Pharmacist Clinician (PhC)/ Clinical Pharmacy Specialist

## 2021-04-27 ENCOUNTER — Other Ambulatory Visit (HOSPITAL_COMMUNITY): Payer: Self-pay

## 2021-04-27 DIAGNOSIS — Z7901 Long term (current) use of anticoagulants: Secondary | ICD-10-CM

## 2021-04-27 DIAGNOSIS — Z952 Presence of prosthetic heart valve: Secondary | ICD-10-CM

## 2021-04-27 LAB — CUP PACEART REMOTE DEVICE CHECK
Battery Impedance: 1868 Ohm
Battery Remaining Longevity: 43 mo
Battery Voltage: 2.76 V
Brady Statistic AP VP Percent: 0 %
Brady Statistic AP VS Percent: 36 %
Brady Statistic AS VP Percent: 0 %
Brady Statistic AS VS Percent: 64 %
Date Time Interrogation Session: 20220526191401
Implantable Lead Implant Date: 20110412
Implantable Lead Implant Date: 20110412
Implantable Lead Location: 753859
Implantable Lead Location: 753860
Implantable Lead Model: 5076
Implantable Lead Model: 5076
Implantable Pulse Generator Implant Date: 20110412
Lead Channel Impedance Value: 469 Ohm
Lead Channel Impedance Value: 601 Ohm
Lead Channel Pacing Threshold Amplitude: 0.5 V
Lead Channel Pacing Threshold Amplitude: 1.5 V
Lead Channel Pacing Threshold Pulse Width: 0.4 ms
Lead Channel Pacing Threshold Pulse Width: 0.4 ms
Lead Channel Setting Pacing Amplitude: 2 V
Lead Channel Setting Pacing Amplitude: 3 V
Lead Channel Setting Pacing Pulse Width: 0.4 ms
Lead Channel Setting Sensing Sensitivity: 2 mV

## 2021-04-27 LAB — PROTIME-INR: INR: 10 — AB (ref <=1.1)

## 2021-04-27 MED ORDER — PHYTONADIONE 5 MG PO TABS
5.0000 mg | ORAL_TABLET | Freq: Once | ORAL | 0 refills | Status: AC
  Filled 2021-04-27: qty 1, 1d supply, fill #0

## 2021-04-27 NOTE — Telephone Encounter (Signed)
I spoke to Morrison from [place] and informed her that the [facility] has the Vitamin K tablet available for the family to pick up.  She verbalized understanding.

## 2021-04-27 NOTE — Progress Notes (Signed)
Under hospice care. Metastatic disease involving pancreas and liver

## 2021-04-27 NOTE — Telephone Encounter (Signed)
Sharyn Lull from [place] calling back to speak with the coumadin clinic. She states they were given an order for vitamin k, but cannot find it at any pharmacy. She states if the office has it the family come to office to pickup it. She would like a call back as soon as possible so they can get the medication before closing.

## 2021-04-28 ENCOUNTER — Telehealth: Payer: Self-pay | Admitting: Physician Assistant

## 2021-04-28 NOTE — Telephone Encounter (Signed)
   Hospice nurse Tan called the answering service after-hours today with INR results. Chart reviewed, see OV note 12/2020 - historically on Coumadin in context of mechanical AVR/Bentall.  She was in the hospital earlier this month with a persistently elevated INR >10 and unfortunately found to have widely metastatic cancer with presumed pancreatic as primary. She was seen by palliative care and Tan confirms the patient is now under hospice care. At f/u OP anticoag visit 5/23, INR was 7.8. Despite holding 5/24-5/27, recheck INR on 5/27 was still 10.0. She was given vitamin K 5mg  once last night. Hospice was instructed to call back with INR level today which is 8.0. No bleeding reported per hospice nurse. Difficult situation, as it does not appear that her elevated INR is due to warfarin at this point since it has been consistently held. Her coagulopathy is likely due to her intrinsic medical disease. I discussed with Dr. Stanford Breed (DOD) who would suggest continuing to hold Coumadin/warfarin altogether, avoiding further vitamin K, and route to primary cardiologist/Coumadin clinic for review on Tuesday 5/31 for next steps. Tan will relay this information to the patient along with bleeding precautions. Tan asks that the Coumadin clinic reach out to hospice nurse colleague Sharyn Lull at [phone] for further advisement when available.  Charlie Pitter, PA-C

## 2021-05-01 LAB — POCT INR: INR: 16.2 — AB (ref 2.0–3.0)

## 2021-05-01 NOTE — Telephone Encounter (Signed)
Spoke with Sharyn Lull at [phone] and gave orders for INR today.

## 2021-05-02 ENCOUNTER — Other Ambulatory Visit (HOSPITAL_COMMUNITY): Payer: Self-pay

## 2021-05-02 ENCOUNTER — Telehealth: Payer: Self-pay | Admitting: Cardiovascular Disease

## 2021-05-02 ENCOUNTER — Other Ambulatory Visit: Payer: Medicare HMO

## 2021-05-02 ENCOUNTER — Ambulatory Visit (INDEPENDENT_AMBULATORY_CARE_PROVIDER_SITE_OTHER): Payer: Medicare HMO | Admitting: Cardiovascular Disease

## 2021-05-02 DIAGNOSIS — Z7901 Long term (current) use of anticoagulants: Secondary | ICD-10-CM

## 2021-05-02 DIAGNOSIS — Z952 Presence of prosthetic heart valve: Secondary | ICD-10-CM | POA: Diagnosis not present

## 2021-05-02 MED ORDER — PHYTONADIONE 5 MG PO TABS
ORAL_TABLET | ORAL | 0 refills | Status: AC
  Filled 2021-05-02: qty 5, 5d supply, fill #0

## 2021-05-02 NOTE — Telephone Encounter (Signed)
Encounter not needed

## 2021-05-04 ENCOUNTER — Ambulatory Visit (INDEPENDENT_AMBULATORY_CARE_PROVIDER_SITE_OTHER): Payer: Medicare HMO | Admitting: Cardiovascular Disease

## 2021-05-04 DIAGNOSIS — Z952 Presence of prosthetic heart valve: Secondary | ICD-10-CM

## 2021-05-04 DIAGNOSIS — Z7901 Long term (current) use of anticoagulants: Secondary | ICD-10-CM

## 2021-05-04 LAB — POCT INR: INR: 1.6 — AB (ref 2.0–3.0)

## 2021-05-04 MED ORDER — WARFARIN SODIUM 1 MG PO TABS: 1.0000 mg | ORAL_TABLET | Freq: Every day | ORAL | 1 refills | Status: AC

## 2021-05-07 ENCOUNTER — Ambulatory Visit (INDEPENDENT_AMBULATORY_CARE_PROVIDER_SITE_OTHER): Payer: Medicare HMO | Admitting: Pharmacist

## 2021-05-07 ENCOUNTER — Telehealth: Payer: Self-pay

## 2021-05-07 DIAGNOSIS — Z7901 Long term (current) use of anticoagulants: Secondary | ICD-10-CM

## 2021-05-07 LAB — POCT INR: INR: 8 — AB (ref 2.0–3.0)

## 2021-05-07 NOTE — Telephone Encounter (Signed)
Called and spoke w/nurse who will be faxing written documentation that warfarin was stopped

## 2021-05-07 NOTE — Telephone Encounter (Signed)
rn from hospice called and stated that they stopped warfarin and inr checks I will end episode

## 2021-05-07 NOTE — Telephone Encounter (Signed)
Agreed with plan, but will need orders in writing

## 2021-05-10 ENCOUNTER — Telehealth: Payer: Self-pay

## 2021-05-10 NOTE — Telephone Encounter (Signed)
Spoke with patient's son Sherren Mocha regarding scheduling a Palliative care consult. Sherren Mocha states patient is now with [facility]. Will cancel Palliative referral and notify referring MD.

## 2021-05-21 NOTE — Progress Notes (Signed)
Remote pacemaker transmission.   

## 2021-05-30 ENCOUNTER — Other Ambulatory Visit: Payer: Medicare HMO

## 2021-07-02 DEATH — deceased

## 2022-05-05 IMAGING — CR DG CHEST 1V
1 series · 1 of 1 positions shown · non-contrast
Comparison: May 07, 2010

CLINICAL DATA: One month of loss of appetite worsening weakness

EXAM:
CHEST  1 VIEW

[chest ap]
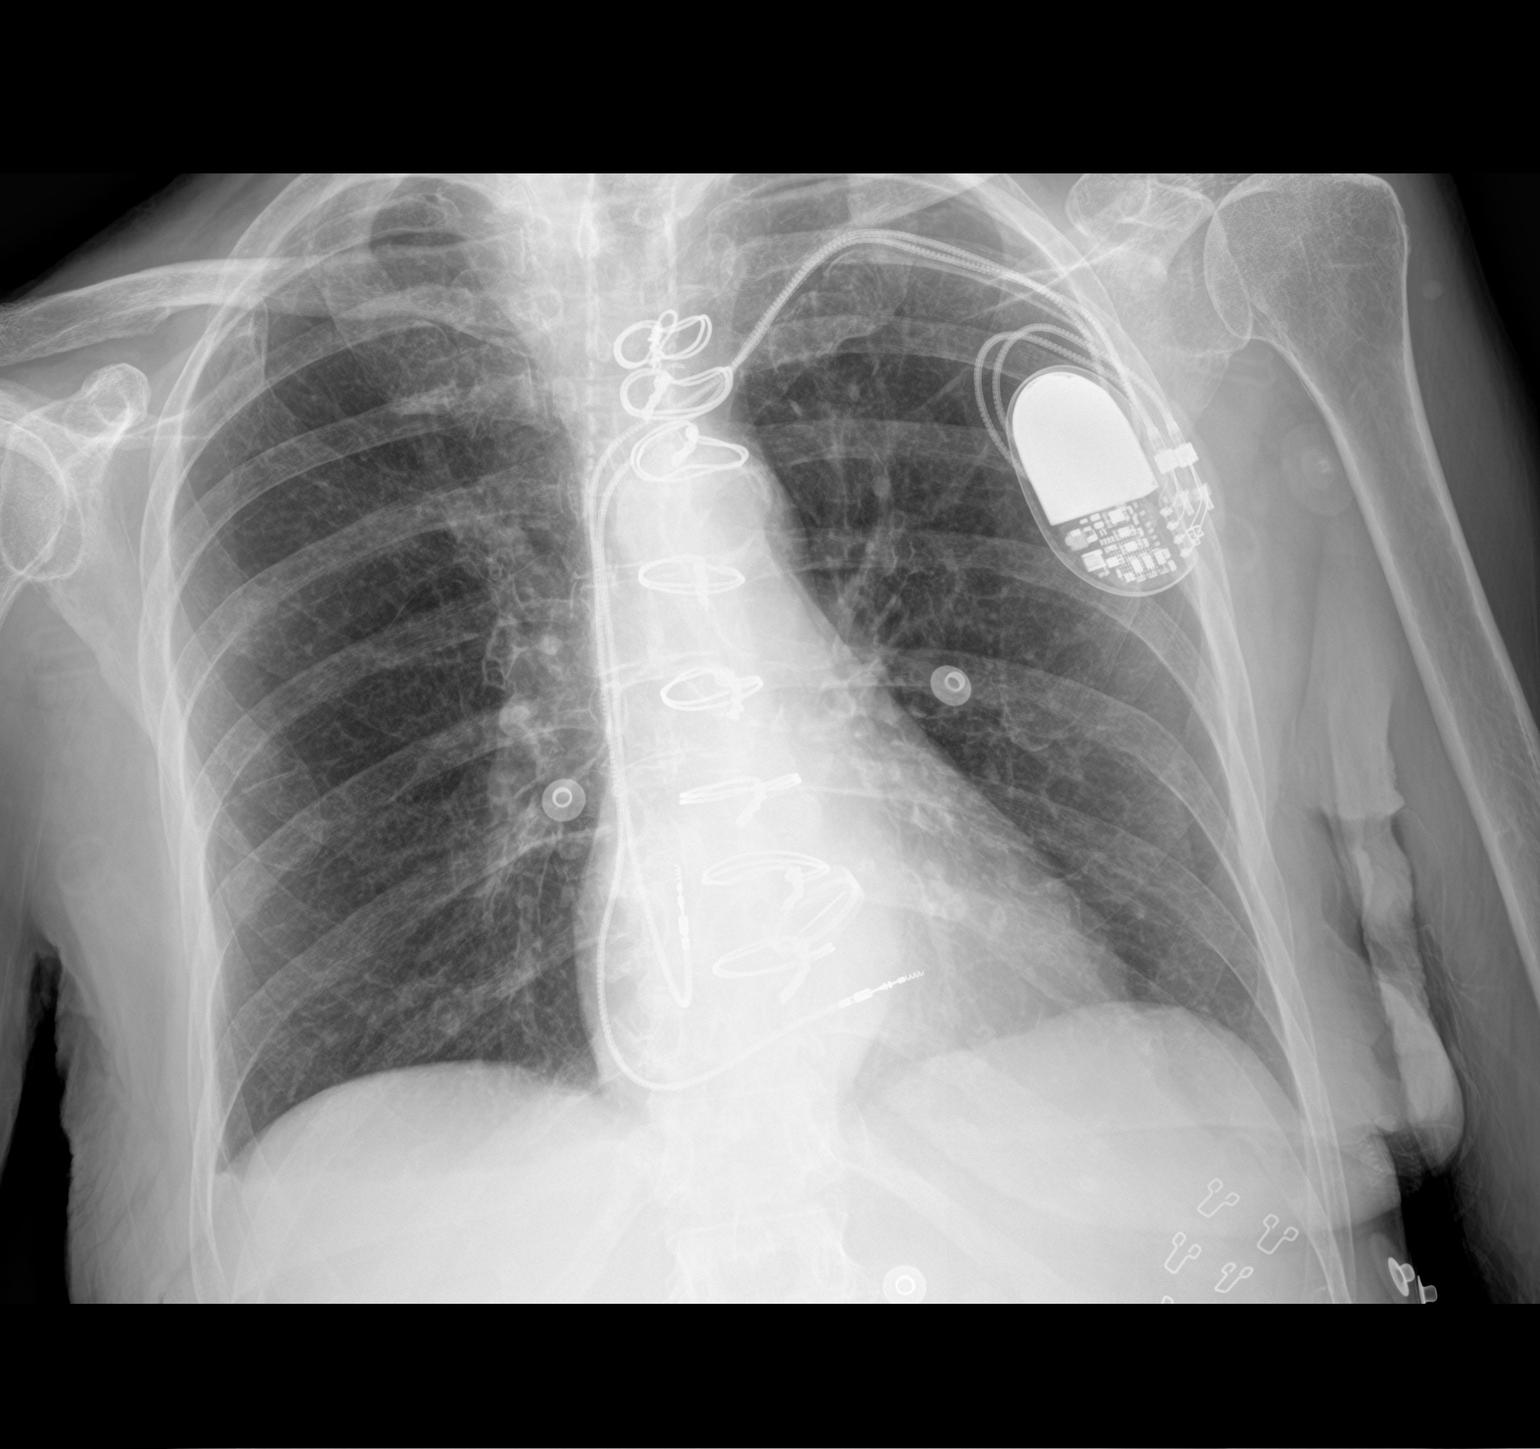

[1 of 1 positions shown; findings below may reference images not displayed]

FINDINGS: Left chest pacemaker with leads overlying the right atrium and right
ventricle. Prior median sternotomy and aortic valve replacement. The
heart size and mediastinal contours are within normal limits. No
focal consolidation. Blunting of the right hemidiaphragm, similar
prior. No visible pleural effusion or pneumothorax. The visualized
skeletal structures are unremarkable.
IMPRESSION: No acute cardiopulmonary findings.
